# Patient Record
Sex: Female | Born: 1945 | Race: White | Hispanic: No | Marital: Married | Smoking: Never smoker
Health system: Southern US, Community
[De-identification: ages and names within clinical notes are randomized; demographics above are authoritative.]

## PROBLEM LIST (undated history)

## (undated) DIAGNOSIS — E785 Hyperlipidemia, unspecified: Secondary | ICD-10-CM

## (undated) DIAGNOSIS — G43909 Migraine, unspecified, not intractable, without status migrainosus: Secondary | ICD-10-CM

## (undated) DIAGNOSIS — J210 Acute bronchiolitis due to respiratory syncytial virus: Secondary | ICD-10-CM

## (undated) DIAGNOSIS — M1990 Unspecified osteoarthritis, unspecified site: Secondary | ICD-10-CM

## (undated) DIAGNOSIS — J309 Allergic rhinitis, unspecified: Secondary | ICD-10-CM

## (undated) DIAGNOSIS — J40 Bronchitis, not specified as acute or chronic: Secondary | ICD-10-CM

## (undated) HISTORY — DX: Migraine, unspecified, not intractable, without status migrainosus: G43.909

## (undated) HISTORY — PX: COLON RESECTION: SHX5231

## (undated) HISTORY — DX: Acute bronchiolitis due to respiratory syncytial virus: J21.0

## (undated) HISTORY — DX: Unspecified osteoarthritis, unspecified site: M19.90

## (undated) HISTORY — DX: Hyperlipidemia, unspecified: E78.5

## (undated) HISTORY — DX: Allergic rhinitis, unspecified: J30.9

## (undated) HISTORY — PX: OTHER SURGICAL HISTORY: SHX169

## (undated) HISTORY — DX: Bronchitis, not specified as acute or chronic: J40

## (undated) HISTORY — PX: TONSILLECTOMY: SUR1361

---

## 1994-09-16 HISTORY — PX: CHOLECYSTECTOMY: SHX55

## 1999-02-18 ENCOUNTER — Emergency Department (HOSPITAL_COMMUNITY): Admission: EM | Admit: 1999-02-18 | Discharge: 1999-02-18 | Payer: Self-pay | Admitting: Emergency Medicine

## 2004-06-29 ENCOUNTER — Ambulatory Visit: Payer: Self-pay | Admitting: Gastroenterology

## 2004-07-11 ENCOUNTER — Ambulatory Visit: Payer: Self-pay | Admitting: General Surgery

## 2004-07-13 ENCOUNTER — Ambulatory Visit: Payer: Self-pay | Admitting: General Surgery

## 2004-07-25 ENCOUNTER — Inpatient Hospital Stay: Payer: Self-pay | Admitting: General Surgery

## 2004-08-14 ENCOUNTER — Ambulatory Visit: Payer: Self-pay | Admitting: General Surgery

## 2004-08-17 ENCOUNTER — Ambulatory Visit: Payer: Self-pay | Admitting: General Surgery

## 2004-08-28 ENCOUNTER — Ambulatory Visit: Payer: Self-pay | Admitting: General Surgery

## 2004-11-12 ENCOUNTER — Ambulatory Visit: Payer: Self-pay | Admitting: General Surgery

## 2005-12-23 ENCOUNTER — Ambulatory Visit: Payer: Self-pay

## 2006-08-25 ENCOUNTER — Ambulatory Visit: Payer: Self-pay | Admitting: Gastroenterology

## 2006-12-18 ENCOUNTER — Ambulatory Visit: Payer: Self-pay | Admitting: Internal Medicine

## 2007-04-06 ENCOUNTER — Ambulatory Visit: Payer: Self-pay | Admitting: Internal Medicine

## 2009-04-18 ENCOUNTER — Ambulatory Visit: Payer: Self-pay | Admitting: Internal Medicine

## 2009-10-23 ENCOUNTER — Ambulatory Visit: Payer: Self-pay | Admitting: Internal Medicine

## 2009-11-06 ENCOUNTER — Ambulatory Visit: Payer: Self-pay | Admitting: Gastroenterology

## 2010-06-01 ENCOUNTER — Other Ambulatory Visit: Payer: Self-pay | Admitting: Internal Medicine

## 2011-11-14 ENCOUNTER — Ambulatory Visit: Payer: Self-pay

## 2012-02-25 ENCOUNTER — Ambulatory Visit: Payer: Self-pay

## 2013-05-25 ENCOUNTER — Ambulatory Visit: Payer: Self-pay

## 2015-10-27 DIAGNOSIS — D519 Vitamin B12 deficiency anemia, unspecified: Secondary | ICD-10-CM | POA: Diagnosis not present

## 2015-12-27 DIAGNOSIS — M25562 Pain in left knee: Secondary | ICD-10-CM | POA: Diagnosis not present

## 2015-12-27 DIAGNOSIS — M79604 Pain in right leg: Secondary | ICD-10-CM | POA: Diagnosis not present

## 2015-12-27 DIAGNOSIS — G894 Chronic pain syndrome: Secondary | ICD-10-CM | POA: Diagnosis not present

## 2015-12-27 DIAGNOSIS — M25561 Pain in right knee: Secondary | ICD-10-CM | POA: Diagnosis not present

## 2015-12-27 DIAGNOSIS — M069 Rheumatoid arthritis, unspecified: Secondary | ICD-10-CM | POA: Diagnosis not present

## 2015-12-27 DIAGNOSIS — Z79899 Other long term (current) drug therapy: Secondary | ICD-10-CM | POA: Diagnosis not present

## 2015-12-27 DIAGNOSIS — Z79891 Long term (current) use of opiate analgesic: Secondary | ICD-10-CM | POA: Diagnosis not present

## 2015-12-27 DIAGNOSIS — R52 Pain, unspecified: Secondary | ICD-10-CM | POA: Diagnosis not present

## 2016-01-26 ENCOUNTER — Other Ambulatory Visit: Payer: Self-pay | Admitting: Physician Assistant

## 2016-01-26 ENCOUNTER — Ambulatory Visit
Admission: RE | Admit: 2016-01-26 | Discharge: 2016-01-26 | Disposition: A | Discharge status: Home / Self Care | Payer: Medicare Other | Source: Ambulatory Visit | Attending: Physician Assistant | Admitting: Physician Assistant

## 2016-01-26 DIAGNOSIS — R05 Cough: Secondary | ICD-10-CM | POA: Diagnosis not present

## 2016-01-26 DIAGNOSIS — J986 Disorders of diaphragm: Secondary | ICD-10-CM | POA: Diagnosis not present

## 2016-01-26 DIAGNOSIS — D519 Vitamin B12 deficiency anemia, unspecified: Secondary | ICD-10-CM | POA: Diagnosis not present

## 2016-01-26 DIAGNOSIS — R059 Cough, unspecified: Secondary | ICD-10-CM

## 2016-01-26 DIAGNOSIS — J209 Acute bronchitis, unspecified: Secondary | ICD-10-CM | POA: Diagnosis not present

## 2016-01-31 DIAGNOSIS — J986 Disorders of diaphragm: Secondary | ICD-10-CM | POA: Diagnosis not present

## 2016-01-31 DIAGNOSIS — J209 Acute bronchitis, unspecified: Secondary | ICD-10-CM | POA: Diagnosis not present

## 2016-02-22 ENCOUNTER — Ambulatory Visit
Admission: RE | Admit: 2016-02-22 | Discharge: 2016-02-22 | Disposition: A | Discharge status: Home / Self Care | Payer: Medicare Other | Source: Ambulatory Visit | Attending: Physician Assistant | Admitting: Physician Assistant

## 2016-02-22 ENCOUNTER — Other Ambulatory Visit: Payer: Self-pay | Admitting: Physician Assistant

## 2016-02-22 DIAGNOSIS — R059 Cough, unspecified: Secondary | ICD-10-CM

## 2016-02-22 DIAGNOSIS — R05 Cough: Secondary | ICD-10-CM | POA: Diagnosis not present

## 2016-02-22 DIAGNOSIS — R918 Other nonspecific abnormal finding of lung field: Secondary | ICD-10-CM | POA: Diagnosis not present

## 2016-02-23 DIAGNOSIS — D519 Vitamin B12 deficiency anemia, unspecified: Secondary | ICD-10-CM | POA: Diagnosis not present

## 2016-02-23 DIAGNOSIS — I517 Cardiomegaly: Secondary | ICD-10-CM | POA: Diagnosis not present

## 2016-02-23 DIAGNOSIS — J986 Disorders of diaphragm: Secondary | ICD-10-CM | POA: Diagnosis not present

## 2016-03-06 DIAGNOSIS — I517 Cardiomegaly: Secondary | ICD-10-CM | POA: Diagnosis not present

## 2016-03-12 DIAGNOSIS — M79604 Pain in right leg: Secondary | ICD-10-CM | POA: Diagnosis not present

## 2016-03-12 DIAGNOSIS — M25561 Pain in right knee: Secondary | ICD-10-CM | POA: Diagnosis not present

## 2016-03-12 DIAGNOSIS — Z79899 Other long term (current) drug therapy: Secondary | ICD-10-CM | POA: Diagnosis not present

## 2016-03-12 DIAGNOSIS — R52 Pain, unspecified: Secondary | ICD-10-CM | POA: Diagnosis not present

## 2016-03-12 DIAGNOSIS — M25562 Pain in left knee: Secondary | ICD-10-CM | POA: Diagnosis not present

## 2016-03-12 DIAGNOSIS — M069 Rheumatoid arthritis, unspecified: Secondary | ICD-10-CM | POA: Diagnosis not present

## 2016-06-03 DIAGNOSIS — R52 Pain, unspecified: Secondary | ICD-10-CM | POA: Diagnosis not present

## 2016-06-03 DIAGNOSIS — M79604 Pain in right leg: Secondary | ICD-10-CM | POA: Diagnosis not present

## 2016-06-03 DIAGNOSIS — M069 Rheumatoid arthritis, unspecified: Secondary | ICD-10-CM | POA: Diagnosis not present

## 2016-06-03 DIAGNOSIS — M4806 Spinal stenosis, lumbar region: Secondary | ICD-10-CM | POA: Diagnosis not present

## 2016-06-03 DIAGNOSIS — M25561 Pain in right knee: Secondary | ICD-10-CM | POA: Diagnosis not present

## 2016-06-03 DIAGNOSIS — M25562 Pain in left knee: Secondary | ICD-10-CM | POA: Diagnosis not present

## 2016-06-03 DIAGNOSIS — Z79899 Other long term (current) drug therapy: Secondary | ICD-10-CM | POA: Diagnosis not present

## 2016-08-14 DIAGNOSIS — Z23 Encounter for immunization: Secondary | ICD-10-CM | POA: Diagnosis not present

## 2016-09-03 DIAGNOSIS — M48061 Spinal stenosis, lumbar region without neurogenic claudication: Secondary | ICD-10-CM | POA: Diagnosis not present

## 2016-09-03 DIAGNOSIS — M79604 Pain in right leg: Secondary | ICD-10-CM | POA: Diagnosis not present

## 2016-09-03 DIAGNOSIS — M069 Rheumatoid arthritis, unspecified: Secondary | ICD-10-CM | POA: Diagnosis not present

## 2016-09-03 DIAGNOSIS — R52 Pain, unspecified: Secondary | ICD-10-CM | POA: Diagnosis not present

## 2016-09-03 DIAGNOSIS — Z79891 Long term (current) use of opiate analgesic: Secondary | ICD-10-CM | POA: Diagnosis not present

## 2016-09-03 DIAGNOSIS — Z79899 Other long term (current) drug therapy: Secondary | ICD-10-CM | POA: Diagnosis not present

## 2016-09-03 DIAGNOSIS — M25562 Pain in left knee: Secondary | ICD-10-CM | POA: Diagnosis not present

## 2016-09-03 DIAGNOSIS — G894 Chronic pain syndrome: Secondary | ICD-10-CM | POA: Diagnosis not present

## 2016-09-03 DIAGNOSIS — M25561 Pain in right knee: Secondary | ICD-10-CM | POA: Diagnosis not present

## 2016-09-23 DIAGNOSIS — I517 Cardiomegaly: Secondary | ICD-10-CM | POA: Diagnosis not present

## 2016-09-23 DIAGNOSIS — Z0001 Encounter for general adult medical examination with abnormal findings: Secondary | ICD-10-CM | POA: Diagnosis not present

## 2016-09-23 DIAGNOSIS — D519 Vitamin B12 deficiency anemia, unspecified: Secondary | ICD-10-CM | POA: Diagnosis not present

## 2016-09-23 DIAGNOSIS — K219 Gastro-esophageal reflux disease without esophagitis: Secondary | ICD-10-CM | POA: Diagnosis not present

## 2016-11-26 DIAGNOSIS — Z79899 Other long term (current) drug therapy: Secondary | ICD-10-CM | POA: Diagnosis not present

## 2016-11-26 DIAGNOSIS — M25562 Pain in left knee: Secondary | ICD-10-CM | POA: Diagnosis not present

## 2016-11-26 DIAGNOSIS — M25561 Pain in right knee: Secondary | ICD-10-CM | POA: Diagnosis not present

## 2016-11-26 DIAGNOSIS — M069 Rheumatoid arthritis, unspecified: Secondary | ICD-10-CM | POA: Diagnosis not present

## 2017-02-06 DIAGNOSIS — D519 Vitamin B12 deficiency anemia, unspecified: Secondary | ICD-10-CM | POA: Diagnosis not present

## 2017-02-25 DIAGNOSIS — G894 Chronic pain syndrome: Secondary | ICD-10-CM | POA: Diagnosis not present

## 2017-02-25 DIAGNOSIS — M25561 Pain in right knee: Secondary | ICD-10-CM | POA: Diagnosis not present

## 2017-02-25 DIAGNOSIS — Z79899 Other long term (current) drug therapy: Secondary | ICD-10-CM | POA: Diagnosis not present

## 2017-02-25 DIAGNOSIS — M25562 Pain in left knee: Secondary | ICD-10-CM | POA: Diagnosis not present

## 2017-02-25 DIAGNOSIS — Z79891 Long term (current) use of opiate analgesic: Secondary | ICD-10-CM | POA: Diagnosis not present

## 2017-02-25 DIAGNOSIS — M069 Rheumatoid arthritis, unspecified: Secondary | ICD-10-CM | POA: Diagnosis not present

## 2017-03-12 DIAGNOSIS — R05 Cough: Secondary | ICD-10-CM | POA: Diagnosis not present

## 2017-03-12 DIAGNOSIS — D519 Vitamin B12 deficiency anemia, unspecified: Secondary | ICD-10-CM | POA: Diagnosis not present

## 2017-03-12 DIAGNOSIS — J069 Acute upper respiratory infection, unspecified: Secondary | ICD-10-CM | POA: Diagnosis not present

## 2017-03-18 ENCOUNTER — Ambulatory Visit
Admission: RE | Admit: 2017-03-18 | Discharge: 2017-03-18 | Disposition: A | Discharge status: Home / Self Care | Payer: Medicare Other | Source: Ambulatory Visit | Attending: Nurse Practitioner | Admitting: Nurse Practitioner

## 2017-03-18 ENCOUNTER — Other Ambulatory Visit: Payer: Self-pay | Admitting: Nurse Practitioner

## 2017-03-18 DIAGNOSIS — R059 Cough, unspecified: Secondary | ICD-10-CM

## 2017-03-18 DIAGNOSIS — R062 Wheezing: Secondary | ICD-10-CM

## 2017-03-18 DIAGNOSIS — R05 Cough: Secondary | ICD-10-CM | POA: Diagnosis not present

## 2017-06-12 DIAGNOSIS — M48061 Spinal stenosis, lumbar region without neurogenic claudication: Secondary | ICD-10-CM | POA: Diagnosis not present

## 2017-06-12 DIAGNOSIS — M069 Rheumatoid arthritis, unspecified: Secondary | ICD-10-CM | POA: Diagnosis not present

## 2017-06-12 DIAGNOSIS — M25562 Pain in left knee: Secondary | ICD-10-CM | POA: Diagnosis not present

## 2017-06-12 DIAGNOSIS — M25561 Pain in right knee: Secondary | ICD-10-CM | POA: Diagnosis not present

## 2017-08-05 DIAGNOSIS — J069 Acute upper respiratory infection, unspecified: Secondary | ICD-10-CM | POA: Diagnosis not present

## 2017-08-05 DIAGNOSIS — D519 Vitamin B12 deficiency anemia, unspecified: Secondary | ICD-10-CM | POA: Diagnosis not present

## 2017-08-05 DIAGNOSIS — R05 Cough: Secondary | ICD-10-CM | POA: Diagnosis not present

## 2017-08-27 DIAGNOSIS — M48061 Spinal stenosis, lumbar region without neurogenic claudication: Secondary | ICD-10-CM | POA: Diagnosis not present

## 2017-08-27 DIAGNOSIS — M25562 Pain in left knee: Secondary | ICD-10-CM | POA: Diagnosis not present

## 2017-08-27 DIAGNOSIS — M069 Rheumatoid arthritis, unspecified: Secondary | ICD-10-CM | POA: Diagnosis not present

## 2017-08-27 DIAGNOSIS — M25561 Pain in right knee: Secondary | ICD-10-CM | POA: Diagnosis not present

## 2017-09-25 ENCOUNTER — Encounter: Payer: Self-pay | Admitting: Nurse Practitioner

## 2017-09-25 ENCOUNTER — Other Ambulatory Visit: Payer: Self-pay

## 2017-09-25 DIAGNOSIS — R059 Cough, unspecified: Secondary | ICD-10-CM | POA: Insufficient documentation

## 2017-09-25 DIAGNOSIS — J069 Acute upper respiratory infection, unspecified: Secondary | ICD-10-CM | POA: Insufficient documentation

## 2017-09-25 DIAGNOSIS — R05 Cough: Secondary | ICD-10-CM | POA: Insufficient documentation

## 2017-10-16 ENCOUNTER — Encounter: Payer: Self-pay | Admitting: Nurse Practitioner

## 2017-10-16 ENCOUNTER — Ambulatory Visit: Payer: Medicare Other | Admitting: Nurse Practitioner

## 2017-10-16 VITALS — BP 134/86 | HR 102 | Temp 99.3°F | Resp 16 | Ht 64.0 in | Wt 168.0 lb

## 2017-10-16 DIAGNOSIS — N390 Urinary tract infection, site not specified: Secondary | ICD-10-CM | POA: Diagnosis not present

## 2017-10-16 DIAGNOSIS — E538 Deficiency of other specified B group vitamins: Secondary | ICD-10-CM

## 2017-10-16 DIAGNOSIS — R110 Nausea: Secondary | ICD-10-CM

## 2017-10-16 DIAGNOSIS — D509 Iron deficiency anemia, unspecified: Secondary | ICD-10-CM

## 2017-10-16 DIAGNOSIS — R300 Dysuria: Secondary | ICD-10-CM | POA: Diagnosis not present

## 2017-10-16 DIAGNOSIS — E559 Vitamin D deficiency, unspecified: Secondary | ICD-10-CM

## 2017-10-16 LAB — POCT URINALYSIS DIPSTICK
GLUCOSE UA: NEGATIVE
Nitrite, UA: NEGATIVE
Spec Grav, UA: >=1.03 — AB (ref 1.010–1.025)
Urobilinogen, UA: 2 E.U./dL — AB
pH, UA: 6 (ref 5.0–8.0)

## 2017-10-16 MED ORDER — ONDANSETRON HCL 4 MG PO TABS: 4.0000 mg | ORAL_TABLET | Freq: Three times a day (TID) | ORAL | 1 refills | Status: DC | PRN

## 2017-10-16 MED ORDER — AMOXICILLIN 875 MG PO TABS: 875.0000 mg | ORAL_TABLET | Freq: Two times a day (BID) | ORAL | 0 refills | Status: DC

## 2017-10-16 NOTE — Progress Notes (Addendum)
[facility] ([facility]) [address] [place], [place] [address]  Internal MEDICINE  Office Visit Note  Patient Name: Morgan Abbott  [id number]  [id number]  Date of Service: 10/26/2017  Chief Complaint  Patient presents with  . Nausea    no vomiting. intermittent. indigestion.    The patient is here for sick visit. Feels nauseated but unable to vomit. Started Monday. Unable to eat due to nausea. Feels like she has a hard piece of candy or something stuck in esophagus, going up and down. Hurts a little under her rib cage. She also has increased indigestion. She has taken zantac, omeprazole, and dramamine, without relief of symptoms.    Other  This is a new problem. The current episode started in the past 7 days. The problem occurs daily. The problem has been unchanged. Associated symptoms include abdominal pain, arthralgias, chills, congestion, fatigue, headaches, nausea and urinary symptoms. Pertinent negatives include no chest pain, coughing, sore throat or vomiting. The symptoms are aggravated by swallowing. Treatments tried: OTC dramamine, zantac, and omeprazole. Improvement on treatment: very little improvement.    Pt is here for routine follow up.    Current Medication: Outpatient Encounter Medications as of 10/16/2017  Medication Sig  . betamethasone dipropionate (DIPROLENE) 0.05 % cream Apply topically 2 (two) times daily.  Marland Kitchen ibuprofen (ADVIL,MOTRIN) 800 MG tablet Take 800 mg by mouth 3 (three) times daily as needed.  Marland Kitchen omeprazole (PRILOSEC) 40 MG capsule Take 40 mg by mouth daily.  Marland Kitchen oxyCODONE-acetaminophen (PERCOCET) 10-325 MG tablet TAKE 1 TABLET BY MOUTH EVERY 4 TO 6 HOURS AS NEEDED *MAX OF 5 A DAY* *DNF UNTIL 10/10/17*  . triamcinolone (KENALOG) 0.025 % cream Apply 1 application topically 2 (two) times daily as needed.  Marland Kitchen amoxicillin (AMOXIL) 875 MG tablet Take 1 tablet (875 mg total) by mouth 2 (two) times daily.  . ondansetron (ZOFRAN) 4 MG tablet Take 1 tablet (4 mg  total) by mouth every 8 (eight) hours as needed for nausea or vomiting.   No facility-administered encounter medications on file as of 10/16/2017.     Surgical History: Past Surgical History:  Procedure Laterality Date  . CHOLECYSTECTOMY  1996  . COLON RESECTION    . dilatation and curettage    . TONSILLECTOMY Bilateral     Medical History: Past Medical History:  Diagnosis Date  . Allergic rhinitis   . Arthritis   . Bronchitis   . Hyperlipidemia   . Migraines     Family History: No family history on file.  Social History   Socioeconomic History  . Marital status: Married    Spouse name: Not on file  . Number of children: Not on file  . Years of education: Not on file  . Highest education level: Not on file  Social Needs  . Financial resource strain: Not on file  . Food insecurity - worry: Not on file  . Food insecurity - inability: Not on file  . Transportation needs - medical: Not on file  . Transportation needs - non-medical: Not on file  Occupational History  . Not on file  Tobacco Use  . Smoking status: Never Smoker  . Smokeless tobacco: Never Used  Substance and Sexual Activity  . Alcohol use: No    Frequency: Never  . Drug use: No  . Sexual activity: Not on file  Other Topics Concern  . Not on file  Social History Narrative  . Not on file      Review of Systems  Constitutional: Positive for activity change, appetite change, chills and fatigue.  HENT: Positive for congestion. Negative for postnasal drip, sinus pressure and sore throat.   Eyes: Negative.   Respiratory: Negative for cough, chest tightness, shortness of breath and wheezing.   Cardiovascular: Negative for chest pain and palpitations.  Gastrointestinal: Positive for abdominal pain and nausea. Negative for vomiting.       Increased indigestion.  Endocrine: Negative for cold intolerance, heat intolerance, polydipsia, polyphagia and polyuria.  Genitourinary: Positive for frequency.        Dark color to urine  Musculoskeletal: Positive for arthralgias.  Allergic/Immunologic: Negative.   Neurological: Positive for headaches.  Hematological: Negative for adenopathy. Does not bruise/bleed easily.  Psychiatric/Behavioral: The patient is nervous/anxious.     Today's Vitals   10/16/17 1454  BP: 134/86  Pulse: (!) 102  Resp: 16  Temp: 99.3 F (37.4 C)  SpO2: 96%  Weight: 168 lb (76.2 kg)  Height: 5\' 4"  (1.626 m)    Physical Exam  Constitutional: She is oriented to person, place, and time. She appears well-developed and well-nourished. No distress.  HENT:  Head: Normocephalic and atraumatic.  Mouth/Throat: Oropharynx is clear and moist. No oropharyngeal exudate.  Eyes: EOM are normal. Pupils are equal, round, and reactive to light.  Neck: Normal range of motion. Neck supple. No JVD present. No tracheal deviation present. No thyromegaly present.  Cardiovascular: Normal rate, regular rhythm and normal heart sounds. Exam reveals no gallop and no friction rub.  No murmur heard. Pulmonary/Chest: Effort normal and breath sounds normal. No respiratory distress. She has no wheezes. She has no rales. She exhibits no tenderness.  Abdominal: Soft. Bowel sounds are normal. There is tenderness.  Genitourinary:  Genitourinary Comments: Urine sample positive for moderate blood, trace WBC, and moderate protein.   Musculoskeletal: Normal range of motion.  Lymphadenopathy:    She has no cervical adenopathy.  Neurological: She is alert and oriented to person, place, and time. No cranial nerve deficit.  Skin: Skin is warm and dry. She is not diaphoretic.  Psychiatric: She has a normal mood and affect. Her behavior is normal. Judgment and thought content normal.  Nursing note and vitals reviewed.  Assessment/Plan: 1. Urinary tract infection without hematuria, site unspecified - amoxicillin (AMOXIL) 875 MG tablet; Take 1 tablet (875 mg total) by mouth 2 (two) times daily.  Dispense: 20  tablet; Refill: 0 - CBC w/Diff/Platelet Send urine for culture and sensitivity and adjjust antibiotics a indicated.  2. Dysuria - POCT Urinalysis Dipstick - CULTURE, URINE COMPREHENSIVE Treat infection with amoxicillin 875mg  bid for 10 days. Adjust abx accordig to culture reults.   3. Vitamin B12 deficiency - B12  4. Vitamin D deficiency - Vitamin D 1,25 dihydroxy  5. Nausea - ondansetron (ZOFRAN) 4 MG tablet; Take 1 tablet (4 mg total) by mouth every 8 (eight) hours as needed for nausea or vomiting.  Dispense: 30 tablet; Refill: 1 - CBC w/Diff/Platelet - Comprehensive Metabolic Panel (CMET) BRAT diet recommended. advnce diet as indicated.   6. Iron deficiency anemia, unspecified iron deficiency anemia type - Ferritin   General Counseling: Rona verbalizes understanding of the findings of todays visit and agrees with plan of treatment. I have discussed any further diagnostic evaluation that may be needed or ordered today. We also reviewed her medications today. she has been encouraged to call the office with any questions or concerns that should arise related to todays visit.  This patient was seen by Vincent Gros, FNP- C in Collaboration  with Dr Lyndon CodeFozia M Khan as a part of collaborative care agreement     Orders Placed This Encounter  Procedures  . CULTURE, URINE COMPREHENSIVE  . CBC w/Diff/Platelet  . Comprehensive Metabolic Panel (CMET)  . Vitamin D 1,25 dihydroxy  . B12  . Ferritin  . POCT Urinalysis Dipstick    Meds ordered this encounter  Medications  . amoxicillin (AMOXIL) 875 MG tablet    Sig: Take 1 tablet (875 mg total) by mouth 2 (two) times daily.    Dispense:  20 tablet    Refill:  0    Order Specific Question:   Supervising Provider    Answer:   Lyndon CodeKHAN, FOZIA M [[id number]]  . ondansetron (ZOFRAN) 4 MG tablet    Sig: Take 1 tablet (4 mg total) by mouth every 8 (eight) hours as needed for nausea or vomiting.    Dispense:  30 tablet    Refill:  1    Order  Specific Question:   Supervising Provider    Answer:   Lyndon CodeKHAN, FOZIA M [[id number]]    Time spent: 6915 Minutes     Dr Lyndon CodeFozia M Khan Internal medicine

## 2017-10-19 LAB — CULTURE, URINE COMPREHENSIVE

## 2017-10-20 LAB — CBC WITH DIFFERENTIAL/PLATELET
BASOS ABS: 0.1 10*3/uL (ref 0.0–0.2)
Basos: 1 %
EOS (ABSOLUTE): 0 10*3/uL (ref 0.0–0.4)
Eos: 0 %
HEMOGLOBIN: 14.2 g/dL (ref 11.1–15.9)
Hematocrit: 42.1 % (ref 34.0–46.6)
IMMATURE GRANS (ABS): 0 10*3/uL (ref 0.0–0.1)
Immature Granulocytes: 0 %
LYMPHS ABS: 1.5 10*3/uL (ref 0.7–3.1)
LYMPHS: 16 %
MCH: 30.1 pg (ref 26.6–33.0)
MCHC: 33.7 g/dL (ref 31.5–35.7)
MCV: 89 fL (ref 79–97)
MONOCYTES: 7 %
Monocytes Absolute: 0.6 10*3/uL (ref 0.1–0.9)
Neutrophils Absolute: 7.3 10*3/uL — ABNORMAL HIGH (ref 1.4–7.0)
Neutrophils: 76 %
PLATELETS: 273 10*3/uL (ref 150–379)
RBC: 4.72 x10E6/uL (ref 3.77–5.28)
RDW: 14.1 % (ref 12.3–15.4)
WBC: 9.5 10*3/uL (ref 3.4–10.8)

## 2017-10-20 LAB — COMPREHENSIVE METABOLIC PANEL
ALBUMIN: 4.7 g/dL (ref 3.5–4.8)
ALK PHOS: 135 IU/L — AB (ref 39–117)
ALT: 60 IU/L — AB (ref 0–32)
AST: 53 IU/L — AB (ref 0–40)
Albumin/Globulin Ratio: 1.6 (ref 1.2–2.2)
BILIRUBIN TOTAL: 2.9 mg/dL — AB (ref 0.0–1.2)
BUN / CREAT RATIO: 12 (ref 12–28)
BUN: 14 mg/dL (ref 8–27)
CHLORIDE: 103 mmol/L (ref 96–106)
CO2: 22 mmol/L (ref 20–29)
CREATININE: 1.2 mg/dL — AB (ref 0.57–1.00)
Calcium: 10.2 mg/dL (ref 8.7–10.3)
GFR calc Af Amer: 53 mL/min/{1.73_m2} — ABNORMAL LOW (ref >59)
GFR calc non Af Amer: 46 mL/min/{1.73_m2} — ABNORMAL LOW (ref >59)
GLUCOSE: 126 mg/dL — AB (ref 65–99)
Globulin, Total: 3 g/dL (ref 1.5–4.5)
Potassium: 3.9 mmol/L (ref 3.5–5.2)
Sodium: 142 mmol/L (ref 134–144)
Total Protein: 7.7 g/dL (ref 6.0–8.5)

## 2017-10-20 LAB — VITAMIN D 1,25 DIHYDROXY
VITAMIN D 1, 25 (OH) TOTAL: 37 pg/mL
VITAMIN D2 1, 25 (OH): 16 pg/mL
VITAMIN D3 1, 25 (OH): 21 pg/mL

## 2017-10-20 LAB — VITAMIN B12: VITAMIN B 12: 387 pg/mL (ref 232–1245)

## 2017-10-20 LAB — FERRITIN: FERRITIN: 158 ng/mL — AB (ref 15–150)

## 2017-10-26 ENCOUNTER — Encounter: Payer: Self-pay | Admitting: Nurse Practitioner

## 2017-10-26 DIAGNOSIS — E559 Vitamin D deficiency, unspecified: Secondary | ICD-10-CM | POA: Insufficient documentation

## 2017-10-26 DIAGNOSIS — N390 Urinary tract infection, site not specified: Secondary | ICD-10-CM | POA: Insufficient documentation

## 2017-10-26 DIAGNOSIS — D509 Iron deficiency anemia, unspecified: Secondary | ICD-10-CM | POA: Insufficient documentation

## 2017-10-26 DIAGNOSIS — E538 Deficiency of other specified B group vitamins: Secondary | ICD-10-CM | POA: Insufficient documentation

## 2017-10-26 DIAGNOSIS — R110 Nausea: Secondary | ICD-10-CM | POA: Insufficient documentation

## 2017-11-03 ENCOUNTER — Ambulatory Visit: Payer: Medicare Other | Admitting: Nurse Practitioner

## 2017-11-03 VITALS — BP 146/86 | HR 88 | Ht 63.0 in | Wt 163.8 lb

## 2017-11-03 DIAGNOSIS — R945 Abnormal results of liver function studies: Secondary | ICD-10-CM

## 2017-11-03 DIAGNOSIS — F411 Generalized anxiety disorder: Secondary | ICD-10-CM | POA: Diagnosis not present

## 2017-11-03 DIAGNOSIS — N390 Urinary tract infection, site not specified: Secondary | ICD-10-CM | POA: Diagnosis not present

## 2017-11-03 DIAGNOSIS — R300 Dysuria: Secondary | ICD-10-CM

## 2017-11-03 DIAGNOSIS — E538 Deficiency of other specified B group vitamins: Secondary | ICD-10-CM

## 2017-11-03 DIAGNOSIS — K219 Gastro-esophageal reflux disease without esophagitis: Secondary | ICD-10-CM

## 2017-11-03 DIAGNOSIS — R7989 Other specified abnormal findings of blood chemistry: Secondary | ICD-10-CM

## 2017-11-03 LAB — POCT URINALYSIS DIPSTICK
BILIRUBIN UA: NEGATIVE
GLUCOSE UA: NEGATIVE
Ketones, UA: NEGATIVE
Nitrite, UA: NEGATIVE
RBC UA: NEGATIVE
Spec Grav, UA: 1.01 (ref 1.010–1.025)
Urobilinogen, UA: 0.2 E.U./dL
pH, UA: 5 (ref 5.0–8.0)

## 2017-11-03 MED ORDER — OMEPRAZOLE 40 MG PO CPDR: 40.0000 mg | DELAYED_RELEASE_CAPSULE | Freq: Every day | ORAL | 5 refills | Status: DC

## 2017-11-03 MED ORDER — AMOXICILLIN 875 MG PO TABS: 875.0000 mg | ORAL_TABLET | Freq: Two times a day (BID) | ORAL | 0 refills | Status: DC

## 2017-11-03 MED ORDER — CYANOCOBALAMIN 1000 MCG/ML IJ SOLN
1000.0000 ug | Freq: Once | INTRAMUSCULAR | Status: AC
  Administered 2017-11-03: 1000 ug via INTRAMUSCULAR

## 2017-11-03 MED ORDER — DIAZEPAM 5 MG PO TABS
ORAL_TABLET | ORAL | 3 refills | Status: DC
Start: 2017-11-03 — End: 2019-12-16

## 2017-11-03 NOTE — Progress Notes (Signed)
[facility] [address] [place], [place] [address]  Internal MEDICINE  Office Visit Note  Patient Name: Morgan Abbott  [id number]  [id number]  Date of Service: 11/04/2017  Chief Complaint  Patient presents with  . Urinary Tract Infection    Patient was seen and treated for urinary tract infection at her last visit. She was put on amoxicillin BID for 10 days. Culture and sensitivity of urine indicated mixed urogenital flora. Today, she reports feeling much better. She did have labs drawn since her most recent visit and is here to review the results.     Pt is here for routine follow up.    Current Medication: Outpatient Encounter Medications as of 11/03/2017  Medication Sig  . amoxicillin (AMOXIL) 875 MG tablet Take 1 tablet (875 mg total) by mouth 2 (two) times daily.  . betamethasone dipropionate (DIPROLENE) 0.05 % cream Apply topically 2 (two) times daily.  . diazepam (VALIUM) 5 MG tablet Take 1/2 to 1 tablet po QHS prn anxiety  . ibuprofen (ADVIL,MOTRIN) 800 MG tablet Take 800 mg by mouth 3 (three) times daily as needed.  Marland Kitchen. omeprazole (PRILOSEC) 40 MG capsule Take 1 capsule (40 mg total) by mouth daily.  . ondansetron (ZOFRAN) 4 MG tablet Take 1 tablet (4 mg total) by mouth every 8 (eight) hours as needed for nausea or vomiting.  Marland Kitchen. oxyCODONE-acetaminophen (PERCOCET) 10-325 MG tablet TAKE 1 TABLET BY MOUTH EVERY 4 TO 6 HOURS AS NEEDED *MAX OF 5 A DAY* *DNF UNTIL 10/10/17*  . triamcinolone (KENALOG) 0.025 % cream Apply 1 application topically 2 (two) times daily as needed.  . [DISCONTINUED] amoxicillin (AMOXIL) 875 MG tablet Take 1 tablet (875 mg total) by mouth 2 (two) times daily.  . [DISCONTINUED] omeprazole (PRILOSEC) 40 MG capsule Take 40 mg by mouth daily.  . [EXPIRED] cyanocobalamin ((VITAMIN B-12)) injection 1,000 mcg    No facility-administered encounter medications on file as of 11/03/2017.     Surgical History: Past Surgical History:  Procedure  Laterality Date  . CHOLECYSTECTOMY  1996  . COLON RESECTION    . dilatation and curettage    . TONSILLECTOMY Bilateral     Medical History: Past Medical History:  Diagnosis Date  . Allergic rhinitis   . Arthritis   . Bronchitis   . Hyperlipidemia   . Migraines     Family History: No family history on file.  Social History   Socioeconomic History  . Marital status: Married    Spouse name: Not on file  . Number of children: Not on file  . Years of education: Not on file  . Highest education level: Not on file  Social Needs  . Financial resource strain: Not on file  . Food insecurity - worry: Not on file  . Food insecurity - inability: Not on file  . Transportation needs - medical: Not on file  . Transportation needs - non-medical: Not on file  Occupational History  . Not on file  Tobacco Use  . Smoking status: Never Smoker  . Smokeless tobacco: Never Used  Substance and Sexual Activity  . Alcohol use: No    Frequency: Never  . Drug use: No  . Sexual activity: Not on file  Other Topics Concern  . Not on file  Social History Narrative  . Not on file      Review of Systems  Constitutional: Positive for fatigue. Negative for activity change, appetite change and chills.  HENT: Negative for congestion, postnasal drip, sinus  pressure and sore throat.   Eyes: Negative.   Respiratory: Negative for cough, chest tightness, shortness of breath and wheezing.   Cardiovascular: Negative for chest pain and palpitations.  Gastrointestinal: Negative for abdominal pain, nausea and vomiting.       Increased indigestion.  Endocrine: Negative for cold intolerance, heat intolerance, polydipsia, polyphagia and polyuria.  Genitourinary: Negative for frequency.       Uti symptoms have resolved.   Musculoskeletal: Positive for arthralgias.  Allergic/Immunologic: Negative.   Neurological: Positive for headaches.  Hematological: Negative for adenopathy. Does not bruise/bleed  easily.  Psychiatric/Behavioral: The patient is nervous/anxious.   All other systems reviewed and are negative.   Today's Vitals   11/03/17 1038  BP: (!) 146/86  Pulse: 88  SpO2: 95%  Weight: 163 lb 12.8 oz (74.3 kg)  Height: 5\' 3"  (1.6 m)    Physical Exam  Constitutional: She is oriented to person, place, and time. She appears well-developed and well-nourished. No distress.  HENT:  Head: Normocephalic and atraumatic.  Mouth/Throat: Oropharynx is clear and moist. No oropharyngeal exudate.  Eyes: EOM are normal. Pupils are equal, round, and reactive to light.  Neck: Normal range of motion. Neck supple. No JVD present. No tracheal deviation present. No thyromegaly present.  Cardiovascular: Normal rate, regular rhythm and normal heart sounds. Exam reveals no gallop and no friction rub.  No murmur heard. Pulmonary/Chest: Effort normal and breath sounds normal. No respiratory distress. She has no wheezes. She has no rales. She exhibits no tenderness.  Abdominal: Soft. Bowel sounds are normal. There is no tenderness.  Genitourinary:  Genitourinary Comments: Urine sample positive for trace WBC  Musculoskeletal: Normal range of motion.  Lymphadenopathy:    She has no cervical adenopathy.  Neurological: She is alert and oriented to person, place, and time. No cranial nerve deficit.  Skin: Skin is warm and dry. She is not diaphoretic.  Psychiatric: She has a normal mood and affect. Her behavior is normal. Judgment and thought content normal.  Nursing note and vitals reviewed.   Assessment/Plan:  1. Urinary tract infection without hematuria, site unspecified U/a showing trace WBC. Will continue amoxicillin 875mg  bid for additional 7 days. Continue to drink plenty of water.  - amoxicillin (AMOXIL) 875 MG tablet; Take 1 tablet (875 mg total) by mouth 2 (two) times daily.  Dispense: 14 tablet; Refill: 0  2. B12 deficiency B12 low/normal on recent labs. Continue monthly b12 injections. -  cyanocobalamin ((VITAMIN B-12)) injection 1,000 mcg  3. Dysuria - POCT Urinalysis Dipstick  4. Gastroesophageal reflux disease without esophagitis - omeprazole (PRILOSEC) 40 MG capsule; Take 1 capsule (40 mg total) by mouth daily.  Dispense: 30 capsule; Refill: 5  5. GAD (generalized anxiety disorder) - diazepam (VALIUM) 5 MG tablet; Take 1/2 to 1 tablet po QHS prn anxiety  Dispense: 30 tablet; Refill: 3  6. Abnormal liver function test Recent labs showing elevation of liver functoins. Will have her retest CMP and hepatitis panel in 3 weeks. If abnormality continues, will get abdominal ultrasound for further evaluation.   General Counseling: io dieujuste understanding of the findings of todays visit and agrees with plan of treatment. I have discussed any further diagnostic evaluation that may be needed or ordered today. We also reviewed her medications today. she has been encouraged to call the office with any questions or concerns that should arise related to todays visit.    Orders Placed This Encounter  Procedures  . POCT Urinalysis Dipstick    Meds ordered  this encounter  Medications  . omeprazole (PRILOSEC) 40 MG capsule    Sig: Take 1 capsule (40 mg total) by mouth daily.    Dispense:  30 capsule    Refill:  5    Order Specific Question:   Supervising Provider    Answer:   Lyndon Code [[id number]]  . amoxicillin (AMOXIL) 875 MG tablet    Sig: Take 1 tablet (875 mg total) by mouth 2 (two) times daily.    Dispense:  14 tablet    Refill:  0    Extend rx additional 7 days    Order Specific Question:   Supervising Provider    Answer:   Lyndon Code [[id number]]  . diazepam (VALIUM) 5 MG tablet    Sig: Take 1/2 to 1 tablet po QHS prn anxiety    Dispense:  30 tablet    Refill:  3    Order Specific Question:   Supervising Provider    Answer:   Lyndon Code [[id number]]  . cyanocobalamin ((VITAMIN B-12)) injection 1,000 mcg   This patient was seen by Vincent Gros, FNP- C in  Collaboration with Dr Lyndon Code as a part of collaborative care agreement  Time spent: 20 Minutes   Dr Lyndon Code Internal medicine

## 2017-11-04 ENCOUNTER — Encounter: Payer: Self-pay | Admitting: Nurse Practitioner

## 2017-11-24 DIAGNOSIS — M48061 Spinal stenosis, lumbar region without neurogenic claudication: Secondary | ICD-10-CM | POA: Diagnosis not present

## 2017-11-24 DIAGNOSIS — Z79899 Other long term (current) drug therapy: Secondary | ICD-10-CM | POA: Diagnosis not present

## 2017-11-24 DIAGNOSIS — M25561 Pain in right knee: Secondary | ICD-10-CM | POA: Diagnosis not present

## 2017-11-24 DIAGNOSIS — M069 Rheumatoid arthritis, unspecified: Secondary | ICD-10-CM | POA: Diagnosis not present

## 2017-12-03 ENCOUNTER — Ambulatory Visit (INDEPENDENT_AMBULATORY_CARE_PROVIDER_SITE_OTHER): Payer: Medicare Other

## 2017-12-03 DIAGNOSIS — E538 Deficiency of other specified B group vitamins: Secondary | ICD-10-CM

## 2017-12-03 MED ORDER — CYANOCOBALAMIN 1000 MCG/ML IJ SOLN
1000.0000 ug | Freq: Once | INTRAMUSCULAR | Status: AC
  Administered 2017-12-03: 1000 ug via INTRAMUSCULAR

## 2017-12-31 ENCOUNTER — Other Ambulatory Visit: Payer: Self-pay | Admitting: Internal Medicine

## 2017-12-31 ENCOUNTER — Telehealth: Payer: Self-pay | Admitting: Internal Medicine

## 2017-12-31 ENCOUNTER — Ambulatory Visit (INDEPENDENT_AMBULATORY_CARE_PROVIDER_SITE_OTHER): Payer: Medicare Other

## 2017-12-31 DIAGNOSIS — D519 Vitamin B12 deficiency anemia, unspecified: Secondary | ICD-10-CM

## 2017-12-31 MED ORDER — AZITHROMYCIN 250 MG PO TABS: ORAL_TABLET | ORAL | 0 refills | Status: DC

## 2017-12-31 MED ORDER — CYANOCOBALAMIN 1000 MCG/ML IJ SOLN
1000.0000 ug | Freq: Once | INTRAMUSCULAR | Status: AC
  Administered 2017-12-31: 1000 ug via INTRAMUSCULAR

## 2017-12-31 NOTE — Telephone Encounter (Signed)
Pt informed that zpak sent to pharmacy.  Pt advised that we couldn't send in the tussinex and she asked again if [facility] would reconsider.  I spoke to [facility] and she advised that she wasn't filling the rx for cough medication that pt could take musinex or delsym OTC.  Pt inform of the OTC medications to use.  dbs

## 2018-02-04 ENCOUNTER — Ambulatory Visit: Payer: Self-pay

## 2018-02-05 ENCOUNTER — Other Ambulatory Visit: Payer: Self-pay | Admitting: Nurse Practitioner

## 2018-02-05 ENCOUNTER — Ambulatory Visit (INDEPENDENT_AMBULATORY_CARE_PROVIDER_SITE_OTHER): Payer: Medicare Other

## 2018-02-05 DIAGNOSIS — E538 Deficiency of other specified B group vitamins: Secondary | ICD-10-CM | POA: Diagnosis not present

## 2018-02-05 DIAGNOSIS — R945 Abnormal results of liver function studies: Secondary | ICD-10-CM | POA: Diagnosis not present

## 2018-02-05 MED ORDER — CYANOCOBALAMIN 1000 MCG/ML IJ SOLN
1000.0000 ug | Freq: Once | INTRAMUSCULAR | Status: AC
  Administered 2018-02-05: 1000 ug via INTRAMUSCULAR

## 2018-02-05 NOTE — Progress Notes (Signed)
b12

## 2018-02-06 LAB — COMPREHENSIVE METABOLIC PANEL
ALBUMIN: 4.4 g/dL (ref 3.5–4.8)
ALK PHOS: 101 IU/L (ref 39–117)
ALT: 14 IU/L (ref 0–32)
AST: 20 IU/L (ref 0–40)
Albumin/Globulin Ratio: 1.9 (ref 1.2–2.2)
BILIRUBIN TOTAL: 1.9 mg/dL — AB (ref 0.0–1.2)
BUN / CREAT RATIO: 11 — AB (ref 12–28)
BUN: 10 mg/dL (ref 8–27)
CHLORIDE: 108 mmol/L — AB (ref 96–106)
CO2: 24 mmol/L (ref 20–29)
Calcium: 9.2 mg/dL (ref 8.7–10.3)
Creatinine, Ser: 0.94 mg/dL (ref 0.57–1.00)
GFR calc non Af Amer: 61 mL/min/{1.73_m2} (ref >59)
GFR, EST AFRICAN AMERICAN: 71 mL/min/{1.73_m2} (ref >59)
GLOBULIN, TOTAL: 2.3 g/dL (ref 1.5–4.5)
Glucose: 100 mg/dL — ABNORMAL HIGH (ref 65–99)
Potassium: 4.3 mmol/L (ref 3.5–5.2)
SODIUM: 150 mmol/L — AB (ref 134–144)
Total Protein: 6.7 g/dL (ref 6.0–8.5)

## 2018-02-06 LAB — HEPATITIS PANEL, ACUTE
HEP A IGM: NEGATIVE
Hep B C IgM: NEGATIVE
Hep C Virus Ab: <0.1 s/co ratio (ref 0.0–0.9)
Hepatitis B Surface Ag: NEGATIVE

## 2018-02-17 ENCOUNTER — Telehealth: Payer: Self-pay | Admitting: Nurse Practitioner

## 2018-02-17 NOTE — Telephone Encounter (Signed)
Pt was called and notified of results

## 2018-02-27 ENCOUNTER — Other Ambulatory Visit: Payer: Self-pay

## 2018-02-27 DIAGNOSIS — K219 Gastro-esophageal reflux disease without esophagitis: Secondary | ICD-10-CM

## 2018-02-27 MED ORDER — OMEPRAZOLE 40 MG PO CPDR: 40.0000 mg | DELAYED_RELEASE_CAPSULE | Freq: Every day | ORAL | 5 refills | Status: DC

## 2018-03-04 DIAGNOSIS — M48061 Spinal stenosis, lumbar region without neurogenic claudication: Secondary | ICD-10-CM | POA: Diagnosis not present

## 2018-03-04 DIAGNOSIS — M25819 Other specified joint disorders, unspecified shoulder: Secondary | ICD-10-CM | POA: Diagnosis not present

## 2018-03-04 DIAGNOSIS — M069 Rheumatoid arthritis, unspecified: Secondary | ICD-10-CM | POA: Diagnosis not present

## 2018-03-04 DIAGNOSIS — M25561 Pain in right knee: Secondary | ICD-10-CM | POA: Diagnosis not present

## 2018-03-05 ENCOUNTER — Encounter: Payer: Self-pay | Admitting: Nurse Practitioner

## 2018-05-27 DIAGNOSIS — M48061 Spinal stenosis, lumbar region without neurogenic claudication: Secondary | ICD-10-CM | POA: Diagnosis not present

## 2018-05-27 DIAGNOSIS — M069 Rheumatoid arthritis, unspecified: Secondary | ICD-10-CM | POA: Diagnosis not present

## 2018-05-27 DIAGNOSIS — M25819 Other specified joint disorders, unspecified shoulder: Secondary | ICD-10-CM | POA: Diagnosis not present

## 2018-05-27 DIAGNOSIS — M1991 Primary osteoarthritis, unspecified site: Secondary | ICD-10-CM | POA: Diagnosis not present

## 2018-05-27 DIAGNOSIS — Z79891 Long term (current) use of opiate analgesic: Secondary | ICD-10-CM | POA: Diagnosis not present

## 2018-08-26 DIAGNOSIS — M48061 Spinal stenosis, lumbar region without neurogenic claudication: Secondary | ICD-10-CM | POA: Diagnosis not present

## 2018-08-26 DIAGNOSIS — M25561 Pain in right knee: Secondary | ICD-10-CM | POA: Diagnosis not present

## 2018-08-26 DIAGNOSIS — M069 Rheumatoid arthritis, unspecified: Secondary | ICD-10-CM | POA: Diagnosis not present

## 2018-08-26 DIAGNOSIS — M25562 Pain in left knee: Secondary | ICD-10-CM | POA: Diagnosis not present

## 2018-09-02 ENCOUNTER — Other Ambulatory Visit: Payer: Self-pay

## 2018-09-02 DIAGNOSIS — K219 Gastro-esophageal reflux disease without esophagitis: Secondary | ICD-10-CM

## 2018-09-02 MED ORDER — OMEPRAZOLE 40 MG PO CPDR: 40.0000 mg | DELAYED_RELEASE_CAPSULE | Freq: Every day | ORAL | 5 refills | Status: DC

## 2018-11-25 DIAGNOSIS — M069 Rheumatoid arthritis, unspecified: Secondary | ICD-10-CM | POA: Diagnosis not present

## 2018-11-25 DIAGNOSIS — M25562 Pain in left knee: Secondary | ICD-10-CM | POA: Diagnosis not present

## 2018-11-25 DIAGNOSIS — M25561 Pain in right knee: Secondary | ICD-10-CM | POA: Diagnosis not present

## 2018-11-25 DIAGNOSIS — M48061 Spinal stenosis, lumbar region without neurogenic claudication: Secondary | ICD-10-CM | POA: Diagnosis not present

## 2018-12-01 ENCOUNTER — Ambulatory Visit: Payer: Medicare Other | Admitting: Nurse Practitioner

## 2019-01-28 ENCOUNTER — Other Ambulatory Visit: Payer: Self-pay

## 2019-01-28 MED ORDER — BETAMETHASONE DIPROPIONATE 0.05 % EX CREA: TOPICAL_CREAM | Freq: Two times a day (BID) | CUTANEOUS | 0 refills | Status: DC

## 2019-02-11 ENCOUNTER — Ambulatory Visit: Payer: Medicare Other | Admitting: Nurse Practitioner

## 2019-02-11 ENCOUNTER — Encounter: Payer: Self-pay | Admitting: Nurse Practitioner

## 2019-02-11 ENCOUNTER — Other Ambulatory Visit: Payer: Self-pay

## 2019-02-11 VITALS — Ht 63.0 in | Wt 165.0 lb

## 2019-02-11 DIAGNOSIS — E538 Deficiency of other specified B group vitamins: Secondary | ICD-10-CM

## 2019-02-11 DIAGNOSIS — K219 Gastro-esophageal reflux disease without esophagitis: Secondary | ICD-10-CM | POA: Diagnosis not present

## 2019-02-11 DIAGNOSIS — F411 Generalized anxiety disorder: Secondary | ICD-10-CM

## 2019-02-11 DIAGNOSIS — J4520 Mild intermittent asthma, uncomplicated: Secondary | ICD-10-CM

## 2019-02-11 NOTE — Progress Notes (Signed)
[facility] [address] [place], [place] [address]  Internal MEDICINE  Telephone Visit  Patient Name: Morgan Abbott  [id number]  [id number]  Date of Service: 02/22/2019  I connected with the patient at 11:50am by telephone and verified the patients identity using two identifiers.   I discussed the limitations, risks, security and privacy concerns of performing an evaluation and management service by telephone and the availability of in person appointments. I also discussed with the patient that there may be a patient responsible charge related to the service.  The patient expressed understanding and agrees to proceed.    Chief Complaint  Patient presents with  . Telephone Assessment  . Telephone Screen  . Follow-up    note for work  . Quality Metric Gaps    BMD,AWV and pneumonia    The patient has been contacted via telephone for follow up visit due to concerns for spread of novel coronavirus. The patient has been out of work since March due to novel coronavirus. She has been getting her normal pay and this will continue through June 1. Her HR representative told her that, in order to continue this benefit, she needs to have a letter from her PCP stating that she is, in fact, at increased risk for contracting COVID 19 as well as complications from this disease. The patient is older than 72 years of age, which puts her, automatically at increased risk for contracting the virus and from suffering complications related to it. She has also had frequent episodes of pneumonia, most recent in 2018. Those, like Morgan Abbott, who have underlying lung conditions are also at increaed risk for contracting COVID 19 and complications, up to and inculding death, if they should get this virus. Due to these factors, I consider Morgan Abbott to be at high risk for novel coronavirus and she should continue to e out of work from February 15, 2019 through March 16, 2019.       Current  Medication: Outpatient Encounter Medications as of 02/11/2019  Medication Sig  . betamethasone dipropionate (DIPROLENE) 0.05 % cream Apply topically 2 (two) times daily.  . diazepam (VALIUM) 5 MG tablet Take 1/2 to 1 tablet po QHS prn anxiety  . ibuprofen (ADVIL,MOTRIN) 800 MG tablet Take 800 mg by mouth 3 (three) times daily as needed.  Marland Kitchen omeprazole (PRILOSEC) 40 MG capsule Take 1 capsule (40 mg total) by mouth daily.  . ondansetron (ZOFRAN) 4 MG tablet Take 1 tablet (4 mg total) by mouth every 8 (eight) hours as needed for nausea or vomiting.  Marland Kitchen oxyCODONE-acetaminophen (PERCOCET) 10-325 MG tablet TAKE 1 TABLET BY MOUTH EVERY 4 TO 6 HOURS AS NEEDED *MAX OF 5 A DAY* *DNF UNTIL 10/10/17*  . triamcinolone (KENALOG) 0.025 % cream Apply 1 application topically 2 (two) times daily as needed.  Marland Kitchen amoxicillin (AMOXIL) 875 MG tablet Take 1 tablet (875 mg total) by mouth 2 (two) times daily. (Patient not taking: Reported on 02/11/2019)  . [DISCONTINUED] azithromycin (ZITHROMAX) 250 MG tablet Use as directed   No facility-administered encounter medications on file as of 02/11/2019.     Surgical History: Past Surgical History:  Procedure Laterality Date  . CHOLECYSTECTOMY  1996  . COLON RESECTION    . dilatation and curettage    . TONSILLECTOMY Bilateral     Medical History: Past Medical History:  Diagnosis Date  . Allergic rhinitis   . Arthritis   . Bronchitis   . Hyperlipidemia   . Migraines  Family History: History reviewed. No pertinent family history.  Social History   Socioeconomic History  . Marital status: Married    Spouse name: Not on file  . Number of children: Not on file  . Years of education: Not on file  . Highest education level: Not on file  Occupational History  . Not on file  Social Needs  . Financial resource strain: Not on file  . Food insecurity:    Worry: Not on file    Inability: Not on file  . Transportation needs:    Medical: Not on file     Non-medical: Not on file  Tobacco Use  . Smoking status: Never Smoker  . Smokeless tobacco: Never Used  Substance and Sexual Activity  . Alcohol use: No    Frequency: Never  . Drug use: No  . Sexual activity: Not on file  Lifestyle  . Physical activity:    Days per week: Not on file    Minutes per session: Not on file  . Stress: Not on file  Relationships  . Social connections:    Talks on phone: Not on file    Gets together: Not on file    Attends religious service: Not on file    Active member of club or organization: Not on file    Attends meetings of clubs or organizations: Not on file    Relationship status: Not on file  . Intimate partner violence:    Fear of current or ex partner: Not on file    Emotionally abused: Not on file    Physically abused: Not on file    Forced sexual activity: Not on file  Other Topics Concern  . Not on file  Social History Narrative  . Not on file      Review of Systems  Constitutional: Positive for fatigue. Negative for activity change, appetite change and chills.  HENT: Negative for congestion, postnasal drip, sinus pressure and sore throat.   Respiratory: Negative for cough, chest tightness, shortness of breath and wheezing.        History of asthma and community acquired pneumonia.   Cardiovascular: Negative for chest pain and palpitations.  Gastrointestinal: Negative for abdominal pain, nausea and vomiting.  Endocrine: Negative for cold intolerance, heat intolerance, polydipsia, polyphagia and polyuria.  Genitourinary: Negative for frequency.       Uti symptoms have resolved.   Musculoskeletal: Positive for arthralgias.  Allergic/Immunologic: Negative.   Neurological: Positive for headaches.  Hematological: Negative for adenopathy. Does not bruise/bleed easily.  Psychiatric/Behavioral: The patient is nervous/anxious.   All other systems reviewed and are negative.   Today's Vitals   02/11/19 1114  Weight: 165 lb (74.8 kg)   Height:  (1.6 m)   Body mass index is 29.23 kg/m.  Observation/Objective:   The patient is alert and oriented. She is pleasant and answers all questions appropriately. Breathing is non-labored. She is in no acute distress at this time.    Assessment/Plan: 1. Mild intermittent asthma without complication Patient with history of asthma and community acquired pneumonia. Has been out of work since March due to COVID 19. She needs to have a work note keeping her out of work through march of June, 2020, and longer, as riask of COVID 19 continues to spread.   2. Gastroesophageal reflux disease without esophagitis Stable.   3. Vitamin B12 deficiency Continue b12 injections as needed   4. GAD (generalized anxiety disorder) May take diazepam as needed and as prescribed  General Counseling: Morgan Abbott verbalizes understanding of the findings of today's phone visit and agrees with plan of treatment. I have discussed any further diagnostic evaluation that may be needed or ordered today. We also reviewed her medications today. she has been encouraged to call the office with any questions or concerns that should arise related to todays visit.  This patient was seen by Vincent GrosHeather Keshayla Schrum FNP Collaboration with Dr Lyndon CodeFozia M Khan as a part of collaborative care agreement  Time spent:25 Minutes    Dr Lyndon CodeFozia M Khan Internal medicine

## 2019-02-17 ENCOUNTER — Telehealth: Payer: Self-pay

## 2019-02-17 DIAGNOSIS — M069 Rheumatoid arthritis, unspecified: Secondary | ICD-10-CM | POA: Diagnosis not present

## 2019-02-17 DIAGNOSIS — M25561 Pain in right knee: Secondary | ICD-10-CM | POA: Diagnosis not present

## 2019-02-17 DIAGNOSIS — M48061 Spinal stenosis, lumbar region without neurogenic claudication: Secondary | ICD-10-CM | POA: Diagnosis not present

## 2019-02-17 DIAGNOSIS — M25562 Pain in left knee: Secondary | ICD-10-CM | POA: Diagnosis not present

## 2019-02-17 NOTE — Telephone Encounter (Signed)
I have not. What days did she miss work?

## 2019-02-18 NOTE — Telephone Encounter (Signed)
In her visit note it said she needed one from June 1st to June 30th, due to her underlying conditions she is at high risk.

## 2019-02-22 DIAGNOSIS — F411 Generalized anxiety disorder: Secondary | ICD-10-CM | POA: Insufficient documentation

## 2019-02-22 DIAGNOSIS — K219 Gastro-esophageal reflux disease without esophagitis: Secondary | ICD-10-CM | POA: Insufficient documentation

## 2019-02-22 DIAGNOSIS — J4520 Mild intermittent asthma, uncomplicated: Secondary | ICD-10-CM | POA: Insufficient documentation

## 2019-02-22 NOTE — Telephone Encounter (Signed)
Faxed in to pt work per [employer]

## 2019-03-15 ENCOUNTER — Telehealth: Payer: Self-pay | Admitting: Nurse Practitioner

## 2019-03-16 NOTE — Telephone Encounter (Signed)
Morgan Abbott SPOKE WITH PATIENT AND GAVE DOCOTRS NOTE FOR 07-20

## 2019-03-18 ENCOUNTER — Telehealth: Payer: Self-pay

## 2019-03-18 NOTE — Telephone Encounter (Signed)
error 

## 2019-03-24 NOTE — Telephone Encounter (Signed)
Ask courtney, I think we got this done for her.

## 2019-03-26 NOTE — Telephone Encounter (Signed)
It was done on 03/22/2019, by Loma Sousa.

## 2019-03-29 ENCOUNTER — Other Ambulatory Visit: Payer: Self-pay

## 2019-03-29 MED ORDER — BETAMETHASONE DIPROPIONATE 0.05 % EX CREA: TOPICAL_CREAM | Freq: Two times a day (BID) | CUTANEOUS | 0 refills | Status: DC

## 2019-04-16 ENCOUNTER — Other Ambulatory Visit: Payer: Self-pay

## 2019-04-16 ENCOUNTER — Encounter: Payer: Self-pay | Admitting: Nurse Practitioner

## 2019-04-16 ENCOUNTER — Ambulatory Visit: Payer: Medicare Other | Admitting: Nurse Practitioner

## 2019-04-16 VITALS — Ht 63.0 in | Wt 167.0 lb

## 2019-04-16 DIAGNOSIS — F411 Generalized anxiety disorder: Secondary | ICD-10-CM | POA: Diagnosis not present

## 2019-04-16 DIAGNOSIS — J4520 Mild intermittent asthma, uncomplicated: Secondary | ICD-10-CM | POA: Diagnosis not present

## 2019-04-16 NOTE — Progress Notes (Signed)
[facility] [address] [place], [place] [address]  Internal MEDICINE  Telephone Visit  Patient Name: Morgan Abbott  [id number]  [id number]  Date of Service: 04/16/2019  I connected with the patient at 4:36pm by telephone and verified the patients identity using two identifiers.   I discussed the limitations, risks, security and privacy concerns of performing an evaluation and management service by telephone and the availability of in person appointments. I also discussed with the patient that there may be a patient responsible charge related to the service.  The patient expressed understanding and agrees to proceed.    Chief Complaint  Patient presents with  . Telephone Assessment  . Telephone Screen  . Medical Management of Chronic Issues    note to stay home due to high risk for covid     The patient has been contacted via telephone for follow up visit due to concerns for spread of novel coronavirus. The patient has been out of work since March due to novel coronavirus. She has been getting her normal pay and this will continue from August 1 through May 24, 2019. Her HR representative told her that, in order to continue this benefit, she needs to have a letter from her PCP stating that she is, in fact, at increased risk for contracting COVID 19 as well as complications from this disease. The patient is older than 72 years of age, which puts her, automatically at increased risk for contracting the virus and from suffering complications related to it. She has also had frequent episodes of pneumonia, most recent in 2018. Those, like Morgan Abbott, who have underlying lung conditions are also at increaed risk for contracting COVID 19 and complications, up to and inculding death, if they should get this virus. Due to these factors, I consider Morgan Abbott to be at high risk for novel coronavirus and she should continue to be out of work from 04/17/2019 through 05/24/2019.        Current Medication: Outpatient Encounter Medications as of 04/16/2019  Medication Sig  . betamethasone dipropionate (DIPROLENE) 0.05 % cream Apply topically 2 (two) times daily.  Marland Kitchen. ibuprofen (ADVIL,MOTRIN) 800 MG tablet Take 800 mg by mouth 3 (three) times daily as needed.  Marland Kitchen. omeprazole (PRILOSEC) 40 MG capsule Take 1 capsule (40 mg total) by mouth daily.  . ondansetron (ZOFRAN) 4 MG tablet Take 1 tablet (4 mg total) by mouth every 8 (eight) hours as needed for nausea or vomiting.  Marland Kitchen. oxyCODONE-acetaminophen (PERCOCET) 10-325 MG tablet TAKE 1 TABLET BY MOUTH EVERY 4 TO 6 HOURS AS NEEDED *MAX OF 5 A DAY* *DNF UNTIL 10/10/17*  . triamcinolone (KENALOG) 0.025 % cream Apply 1 application topically 2 (two) times daily as needed.  . diazepam (VALIUM) 5 MG tablet Take 1/2 to 1 tablet po QHS prn anxiety (Patient not taking: Reported on 04/16/2019)  . [DISCONTINUED] amoxicillin (AMOXIL) 875 MG tablet Take 1 tablet (875 mg total) by mouth 2 (two) times daily. (Patient not taking: Reported on 02/11/2019)   No facility-administered encounter medications on file as of 04/16/2019.     Surgical History: Past Surgical History:  Procedure Laterality Date  . CHOLECYSTECTOMY  1996  . COLON RESECTION    . dilatation and curettage    . TONSILLECTOMY Bilateral     Medical History: Past Medical History:  Diagnosis Date  . Allergic rhinitis   . Arthritis   . Bronchitis   . Hyperlipidemia   . Migraines     Family  History: History reviewed. No pertinent family history.  Social History   Socioeconomic History  . Marital status: Married    Spouse name: Not on file  . Number of children: Not on file  . Years of education: Not on file  . Highest education level: Not on file  Occupational History  . Not on file  Social Needs  . Financial resource strain: Not on file  . Food insecurity    Worry: Not on file    Inability: Not on file  . Transportation needs    Medical: Not on file     Non-medical: Not on file  Tobacco Use  . Smoking status: Never Smoker  . Smokeless tobacco: Never Used  Substance and Sexual Activity  . Alcohol use: No    Frequency: Never  . Drug use: No  . Sexual activity: Not on file  Lifestyle  . Physical activity    Days per week: Not on file    Minutes per session: Not on file  . Stress: Not on file  Relationships  . Social Herbalist on phone: Not on file    Gets together: Not on file    Attends religious service: Not on file    Active member of club or organization: Not on file    Attends meetings of clubs or organizations: Not on file    Relationship status: Not on file  . Intimate partner violence    Fear of current or ex partner: Not on file    Emotionally abused: Not on file    Physically abused: Not on file    Forced sexual activity: Not on file  Other Topics Concern  . Not on file  Social History Narrative  . Not on file      Review of Systems  Constitutional: Negative for activity change, appetite change, chills and fatigue.  HENT: Negative for congestion, postnasal drip, sinus pressure and sore throat.   Respiratory: Negative for cough, chest tightness, shortness of breath and wheezing.        History of asthma and community acquired pneumonia.   Cardiovascular: Negative for chest pain and palpitations.  Gastrointestinal: Negative for abdominal pain, nausea and vomiting.  Endocrine: Negative for cold intolerance, heat intolerance, polydipsia and polyuria.  Musculoskeletal: Positive for arthralgias.  Allergic/Immunologic: Negative.   Neurological: Positive for headaches.  Hematological: Negative for adenopathy. Does not bruise/bleed easily.  Psychiatric/Behavioral: The patient is nervous/anxious.   All other systems reviewed and are negative.   Today's Vitals   04/16/19 1354  Weight: 167 lb (75.8 kg)  Height: 5\' 3"  (1.6 m)   Body mass index is 29.58 kg/m.   Observation/Objective:   The patient is  alert and oriented. She is pleasant and answers all questions appropriately. Breathing is non-labored. She is in no acute distress at this time.    Assessment/Plan: 1. Mild intermittent asthma without complication The patient's employer will be given a note keeping her out of work from 04/17/2019 through 05/24/2019 due to risk of contracting COVID 19 and complications which can occur if she were to contract this virus. tis will be faxed to her employer   2. GAD (generalized anxiety disorder) May take diazepam as needed and as prescribed   General Counseling: Morgan Abbott understanding of the findings of today's phone visit and agrees with plan of treatment. I have discussed any further diagnostic evaluation that may be needed or ordered today. We also reviewed her medications today. she has been encouraged  to call the office with any questions or concerns that should arise related to todays visit.  This patient was seen by Vincent GrosHeather Cap Massi FNP Collaboration with Dr Lyndon CodeFozia M Khan as a part of collaborative care agreement  Time spent: 25 Minutes    Dr Lyndon CodeFozia M Khan Internal medicine

## 2019-04-19 DIAGNOSIS — M25819 Other specified joint disorders, unspecified shoulder: Secondary | ICD-10-CM | POA: Diagnosis not present

## 2019-04-19 DIAGNOSIS — M069 Rheumatoid arthritis, unspecified: Secondary | ICD-10-CM | POA: Diagnosis not present

## 2019-04-19 DIAGNOSIS — M25811 Other specified joint disorders, right shoulder: Secondary | ICD-10-CM | POA: Diagnosis not present

## 2019-04-19 DIAGNOSIS — M48061 Spinal stenosis, lumbar region without neurogenic claudication: Secondary | ICD-10-CM | POA: Diagnosis not present

## 2019-04-19 DIAGNOSIS — M25812 Other specified joint disorders, left shoulder: Secondary | ICD-10-CM | POA: Diagnosis not present

## 2019-04-19 DIAGNOSIS — M1991 Primary osteoarthritis, unspecified site: Secondary | ICD-10-CM | POA: Diagnosis not present

## 2019-04-20 ENCOUNTER — Encounter: Payer: Self-pay | Admitting: Nurse Practitioner

## 2019-05-20 ENCOUNTER — Ambulatory Visit: Payer: Medicare Other | Admitting: Nurse Practitioner

## 2019-06-24 DIAGNOSIS — M25561 Pain in right knee: Secondary | ICD-10-CM | POA: Diagnosis not present

## 2019-06-24 DIAGNOSIS — M1991 Primary osteoarthritis, unspecified site: Secondary | ICD-10-CM | POA: Diagnosis not present

## 2019-06-24 DIAGNOSIS — M25562 Pain in left knee: Secondary | ICD-10-CM | POA: Diagnosis not present

## 2019-06-24 DIAGNOSIS — M48061 Spinal stenosis, lumbar region without neurogenic claudication: Secondary | ICD-10-CM | POA: Diagnosis not present

## 2019-08-09 ENCOUNTER — Other Ambulatory Visit: Payer: Self-pay

## 2019-08-09 MED ORDER — BETAMETHASONE DIPROPIONATE 0.05 % EX CREA: TOPICAL_CREAM | Freq: Two times a day (BID) | CUTANEOUS | 0 refills | Status: DC

## 2019-08-26 DIAGNOSIS — M25819 Other specified joint disorders, unspecified shoulder: Secondary | ICD-10-CM | POA: Diagnosis not present

## 2019-08-26 DIAGNOSIS — M069 Rheumatoid arthritis, unspecified: Secondary | ICD-10-CM | POA: Diagnosis not present

## 2019-08-26 DIAGNOSIS — M48061 Spinal stenosis, lumbar region without neurogenic claudication: Secondary | ICD-10-CM | POA: Diagnosis not present

## 2019-08-26 DIAGNOSIS — M1991 Primary osteoarthritis, unspecified site: Secondary | ICD-10-CM | POA: Diagnosis not present

## 2019-10-01 ENCOUNTER — Telehealth: Payer: Self-pay

## 2019-10-01 NOTE — Telephone Encounter (Signed)
Called lmom informing patient need to schedule physical. klh

## 2019-10-19 DIAGNOSIS — M48061 Spinal stenosis, lumbar region without neurogenic claudication: Secondary | ICD-10-CM | POA: Diagnosis not present

## 2019-10-19 DIAGNOSIS — M1991 Primary osteoarthritis, unspecified site: Secondary | ICD-10-CM | POA: Diagnosis not present

## 2019-10-19 DIAGNOSIS — M069 Rheumatoid arthritis, unspecified: Secondary | ICD-10-CM | POA: Diagnosis not present

## 2019-10-19 DIAGNOSIS — M25819 Other specified joint disorders, unspecified shoulder: Secondary | ICD-10-CM | POA: Diagnosis not present

## 2019-10-25 ENCOUNTER — Other Ambulatory Visit: Payer: Self-pay

## 2019-10-25 DIAGNOSIS — K219 Gastro-esophageal reflux disease without esophagitis: Secondary | ICD-10-CM

## 2019-10-25 MED ORDER — OMEPRAZOLE 40 MG PO CPDR: 40.0000 mg | DELAYED_RELEASE_CAPSULE | Freq: Every day | ORAL | 0 refills | Status: DC

## 2019-11-17 ENCOUNTER — Other Ambulatory Visit: Payer: Self-pay

## 2019-11-17 DIAGNOSIS — K219 Gastro-esophageal reflux disease without esophagitis: Secondary | ICD-10-CM

## 2019-11-17 MED ORDER — OMEPRAZOLE 40 MG PO CPDR: 40.0000 mg | DELAYED_RELEASE_CAPSULE | Freq: Every day | ORAL | 0 refills | Status: DC

## 2019-12-14 ENCOUNTER — Telehealth: Payer: Self-pay

## 2019-12-14 NOTE — Telephone Encounter (Signed)
LMOM FOR PATIENT TO CONFIRM AND SCREEN FOR 12-16-19 OV. 

## 2019-12-16 ENCOUNTER — Other Ambulatory Visit: Payer: Self-pay

## 2019-12-16 ENCOUNTER — Ambulatory Visit: Payer: Medicare Other | Admitting: Nurse Practitioner

## 2019-12-16 ENCOUNTER — Encounter: Payer: Self-pay | Admitting: Nurse Practitioner

## 2019-12-16 VITALS — BP 134/75 | HR 93 | Temp 97.2°F | Resp 16 | Ht 63.0 in | Wt 156.8 lb

## 2019-12-16 DIAGNOSIS — R5383 Other fatigue: Secondary | ICD-10-CM

## 2019-12-16 DIAGNOSIS — E538 Deficiency of other specified B group vitamins: Secondary | ICD-10-CM

## 2019-12-16 DIAGNOSIS — K219 Gastro-esophageal reflux disease without esophagitis: Secondary | ICD-10-CM | POA: Diagnosis not present

## 2019-12-16 DIAGNOSIS — F321 Major depressive disorder, single episode, moderate: Secondary | ICD-10-CM

## 2019-12-16 MED ORDER — CITALOPRAM HYDROBROMIDE 10 MG PO TABS: 10.0000 mg | ORAL_TABLET | Freq: Every day | ORAL | 3 refills | Status: DC

## 2019-12-16 MED ORDER — OMEPRAZOLE 40 MG PO CPDR: 40.0000 mg | DELAYED_RELEASE_CAPSULE | Freq: Every day | ORAL | 3 refills | Status: DC

## 2019-12-16 MED ORDER — CYANOCOBALAMIN 1000 MCG/ML IJ SOLN
1000.0000 ug | Freq: Once | INTRAMUSCULAR | Status: AC
  Administered 2019-12-16: 17:00:00 1000 ug via INTRAMUSCULAR

## 2019-12-16 NOTE — Progress Notes (Signed)
[facility] [address] [place], [place] [address]  Internal MEDICINE  Office Visit Note  Patient Name: Morgan Abbott  [id number]  [id number]  Date of Service: 01/01/2020  Chief Complaint  Patient presents with  . Hyperlipidemia  . Arthritis    The patient is here for follow up visit. She recently returned to work after being out of work after being out due to [employer] 19 for close to eight months. She states that she is feeling depressed. Feels hopeless and that there just isn't a point to "being here." she states that she doesn't want to do anything, doesn't want to go anywhere. Not enjoying her normal activities. She denies feeling suicidal and denies feelings of wanting to hurt anyone else.       Current Medication: Outpatient Encounter Medications as of 12/16/2019  Medication Sig  . betamethasone dipropionate 0.05 % cream Apply topically 2 (two) times daily.  Marland Kitchen ibuprofen (ADVIL,MOTRIN) 800 MG tablet Take 800 mg by mouth 3 (three) times daily as needed.  Marland Kitchen omeprazole (PRILOSEC) 40 MG capsule Take 1 capsule (40 mg total) by mouth daily.  Marland Kitchen oxyCODONE-acetaminophen (PERCOCET) 10-325 MG tablet TAKE 1 TABLET BY MOUTH EVERY 4 TO 6 HOURS AS NEEDED *MAX OF 5 A DAY* *DNF UNTIL 10/10/17*  . [DISCONTINUED] omeprazole (PRILOSEC) 40 MG capsule Take 1 capsule (40 mg total) by mouth daily.  . citalopram (CELEXA) 10 MG tablet Take 1 tablet (10 mg total) by mouth daily.  . [DISCONTINUED] diazepam (VALIUM) 5 MG tablet Take 1/2 to 1 tablet po QHS prn anxiety (Patient not taking: Reported on 12/16/2019)  . [DISCONTINUED] ondansetron (ZOFRAN) 4 MG tablet Take 1 tablet (4 mg total) by mouth every 8 (eight) hours as needed for nausea or vomiting. (Patient not taking: Reported on 12/16/2019)  . [DISCONTINUED] triamcinolone (KENALOG) 0.025 % cream Apply 1 application topically 2 (two) times daily as needed.  . [EXPIRED] cyanocobalamin ((VITAMIN B-12)) injection 1,000 mcg    No facility-administered  encounter medications on file as of 12/16/2019.    Surgical History: Past Surgical History:  Procedure Laterality Date  . CHOLECYSTECTOMY  1996  . COLON RESECTION    . dilatation and curettage    . TONSILLECTOMY Bilateral     Medical History: Past Medical History:  Diagnosis Date  . Allergic rhinitis   . Arthritis   . Bronchitis   . Hyperlipidemia   . Migraines     Family History: History reviewed. No pertinent family history.  Social History   Socioeconomic History  . Marital status: Married    Spouse name: Not on file  . Number of children: Not on file  . Years of education: Not on file  . Highest education level: Not on file  Occupational History  . Not on file  Tobacco Use  . Smoking status: Never Smoker  . Smokeless tobacco: Never Used  Substance and Sexual Activity  . Alcohol use: No  . Drug use: No  . Sexual activity: Not on file  Other Topics Concern  . Not on file  Social History Narrative  . Not on file   Social Determinants of Health   Financial Resource Strain:   . Difficulty of Paying Living Expenses:   Food Insecurity:   . Worried About Programme researcher, broadcasting/film/video in the Last Year:   . Barista in the Last Year:   Transportation Needs:   . Freight forwarder (Medical):   Marland Kitchen Lack of Transportation (Non-Medical):  Physical Activity:   . Days of Exercise per Week:   . Minutes of Exercise per Session:   Stress:   . Feeling of Stress :   Social Connections:   . Frequency of Communication with Friends and Family:   . Frequency of Social Gatherings with Friends and Family:   . Attends Religious Services:   . Active Member of Clubs or Organizations:   . Attends Archivist Meetings:   Marland Kitchen Marital Status:   Intimate Partner Violence:   . Fear of Current or Ex-Partner:   . Emotionally Abused:   Marland Kitchen Physically Abused:   . Sexually Abused:       Review of Systems  Constitutional: Positive for fatigue. Negative for activity change,  chills and unexpected weight change.  HENT: Negative for congestion, postnasal drip, rhinorrhea, sneezing and sore throat.   Respiratory: Negative for cough, chest tightness, shortness of breath and wheezing.   Cardiovascular: Negative for chest pain and palpitations.  Gastrointestinal: Negative for abdominal pain, constipation, diarrhea, nausea and vomiting.       Acid reflux.  Endocrine: Negative for cold intolerance, heat intolerance, polydipsia and polyuria.  Musculoskeletal: Negative for arthralgias, back pain, joint swelling and neck pain.  Skin: Negative for rash.  Allergic/Immunologic: Negative for environmental allergies.  Neurological: Negative for dizziness, tremors, numbness and headaches.  Hematological: Negative for adenopathy. Does not bruise/bleed easily.  Psychiatric/Behavioral: Positive for dysphoric mood. Negative for behavioral problems (Depression), sleep disturbance and suicidal ideas. The patient is nervous/anxious.     Today's Vitals   12/16/19 1605  BP: 134/75  Pulse: 93  Resp: 16  Temp: (!) 97.2 F (36.2 C)  SpO2: 97%  Weight: 156 lb 12.8 oz (71.1 kg)  Height: 5\' 3"  (1.6 m)   Body mass index is 27.78 kg/m.  Physical Exam Vitals and nursing note reviewed.  Constitutional:      General: She is not in acute distress.    Appearance: Normal appearance. She is well-developed. She is not diaphoretic.  HENT:     Head: Normocephalic and atraumatic.     Nose: Nose normal.     Mouth/Throat:     Pharynx: No oropharyngeal exudate.  Eyes:     Pupils: Pupils are equal, round, and reactive to light.  Neck:     Thyroid: No thyromegaly.     Vascular: No JVD.     Trachea: No tracheal deviation.  Cardiovascular:     Rate and Rhythm: Normal rate and regular rhythm.     Heart sounds: Normal heart sounds. No murmur. No friction rub. No gallop.   Pulmonary:     Effort: Pulmonary effort is normal. No respiratory distress.     Breath sounds: Normal breath sounds. No  wheezing or rales.  Chest:     Chest wall: No tenderness.  Abdominal:     Palpations: Abdomen is soft.  Musculoskeletal:        General: Normal range of motion.     Cervical back: Normal range of motion and neck supple.  Lymphadenopathy:     Cervical: No cervical adenopathy.  Skin:    General: Skin is warm and dry.  Neurological:     Mental Status: She is alert and oriented to person, place, and time.     Cranial Nerves: No cranial nerve deficit.  Psychiatric:        Attention and Perception: Attention and perception normal.        Mood and Affect: Mood is anxious and depressed. Affect  is tearful.        Speech: Speech normal.        Behavior: Behavior normal. Behavior is cooperative.        Thought Content: Thought content normal.        Cognition and Memory: Cognition and memory normal.        Judgment: Judgment normal.    Assessment/Plan: 1. Gastroesophageal reflux disease without esophagitis Continue omeprazole as prescribed  - omeprazole (PRILOSEC) 40 MG capsule; Take 1 capsule (40 mg total) by mouth daily.  Dispense: 90 capsule; Refill: 3  2. Other fatigue Possibly due to increased depression. Will check labs.   3. Depression, major, single episode, moderate (HCC) Start citalopram 10mg  daily. Reassess at next visit.  - citalopram (CELEXA) 10 MG tablet; Take 1 tablet (10 mg total) by mouth daily.  Dispense: 30 tablet; Refill: 3  4. B12 deficiency b12 injection administered today.  - cyanocobalamin ((VITAMIN B-12)) injection 1,000 mcg  General Counseling: Sulma verbalizes understanding of the findings of todays visit and agrees with plan of treatment. I have discussed any further diagnostic evaluation that may be needed or ordered today. We also reviewed her medications today. she has been encouraged to call the office with any questions or concerns that should arise related to todays visit.   This patient was seen by FNP Collaboration with Dr Vincent Gros as a part of collaborative care agreement  Meds ordered this encounter  Medications  . citalopram (CELEXA) 10 MG tablet    Sig: Take 1 tablet (10 mg total) by mouth daily.    Dispense:  30 tablet    Refill:  3    Order Specific Question:   Supervising Provider    Answer:   Lyndon Code [[id number]]  . omeprazole (PRILOSEC) 40 MG capsule    Sig: Take 1 capsule (40 mg total) by mouth daily.    Dispense:  90 capsule    Refill:  3    Order Specific Question:   Supervising Provider    Answer:   Lyndon Code [[id number]]  . cyanocobalamin ((VITAMIN B-12)) injection 1,000 mcg    Total time spent: 30 Minutes   Time spent includes review of chart, medications, test results, and follow up plan with the patient.      Dr Lyndon Code Internal medicine

## 2019-12-27 DIAGNOSIS — M25819 Other specified joint disorders, unspecified shoulder: Secondary | ICD-10-CM | POA: Diagnosis not present

## 2019-12-27 DIAGNOSIS — M48061 Spinal stenosis, lumbar region without neurogenic claudication: Secondary | ICD-10-CM | POA: Diagnosis not present

## 2019-12-27 DIAGNOSIS — M069 Rheumatoid arthritis, unspecified: Secondary | ICD-10-CM | POA: Diagnosis not present

## 2019-12-27 DIAGNOSIS — Z79891 Long term (current) use of opiate analgesic: Secondary | ICD-10-CM | POA: Diagnosis not present

## 2019-12-27 DIAGNOSIS — M1991 Primary osteoarthritis, unspecified site: Secondary | ICD-10-CM | POA: Diagnosis not present

## 2019-12-30 DIAGNOSIS — M25561 Pain in right knee: Secondary | ICD-10-CM | POA: Diagnosis not present

## 2019-12-30 DIAGNOSIS — M25562 Pain in left knee: Secondary | ICD-10-CM | POA: Diagnosis not present

## 2019-12-30 DIAGNOSIS — M545 Low back pain: Secondary | ICD-10-CM | POA: Diagnosis not present

## 2020-01-01 DIAGNOSIS — F321 Major depressive disorder, single episode, moderate: Secondary | ICD-10-CM | POA: Insufficient documentation

## 2020-01-01 DIAGNOSIS — F331 Major depressive disorder, recurrent, moderate: Secondary | ICD-10-CM | POA: Insufficient documentation

## 2020-01-01 DIAGNOSIS — R5383 Other fatigue: Secondary | ICD-10-CM | POA: Insufficient documentation

## 2020-01-09 ENCOUNTER — Other Ambulatory Visit: Payer: Self-pay | Admitting: Nurse Practitioner

## 2020-01-09 DIAGNOSIS — F321 Major depressive disorder, single episode, moderate: Secondary | ICD-10-CM

## 2020-01-11 DIAGNOSIS — M069 Rheumatoid arthritis, unspecified: Secondary | ICD-10-CM | POA: Diagnosis not present

## 2020-01-11 DIAGNOSIS — M1991 Primary osteoarthritis, unspecified site: Secondary | ICD-10-CM | POA: Diagnosis not present

## 2020-01-11 DIAGNOSIS — M25819 Other specified joint disorders, unspecified shoulder: Secondary | ICD-10-CM | POA: Diagnosis not present

## 2020-01-11 DIAGNOSIS — M48061 Spinal stenosis, lumbar region without neurogenic claudication: Secondary | ICD-10-CM | POA: Diagnosis not present

## 2020-01-27 ENCOUNTER — Ambulatory Visit: Payer: Medicare Other | Admitting: Nurse Practitioner

## 2020-03-02 ENCOUNTER — Ambulatory Visit: Payer: Medicare Other | Admitting: Nurse Practitioner

## 2020-03-14 ENCOUNTER — Telehealth: Payer: Self-pay

## 2020-03-14 NOTE — Telephone Encounter (Signed)
Lmom to confirm and screen for 03-16-20 ov. 

## 2020-03-16 ENCOUNTER — Ambulatory Visit: Payer: Medicare Other | Admitting: Nurse Practitioner

## 2020-03-28 ENCOUNTER — Other Ambulatory Visit: Payer: Self-pay | Admitting: Nurse Practitioner

## 2020-03-28 DIAGNOSIS — Z0001 Encounter for general adult medical examination with abnormal findings: Secondary | ICD-10-CM | POA: Diagnosis not present

## 2020-03-28 DIAGNOSIS — E559 Vitamin D deficiency, unspecified: Secondary | ICD-10-CM | POA: Diagnosis not present

## 2020-03-28 DIAGNOSIS — R5383 Other fatigue: Secondary | ICD-10-CM | POA: Diagnosis not present

## 2020-03-28 DIAGNOSIS — R7301 Impaired fasting glucose: Secondary | ICD-10-CM | POA: Diagnosis not present

## 2020-03-29 LAB — COMPREHENSIVE METABOLIC PANEL
ALT: 14 IU/L (ref 0–32)
AST: 25 IU/L (ref 0–40)
Albumin/Globulin Ratio: 1.6 (ref 1.2–2.2)
Albumin: 4.2 g/dL (ref 3.7–4.7)
Alkaline Phosphatase: 102 IU/L (ref 48–121)
BUN/Creatinine Ratio: 16 (ref 12–28)
BUN: 15 mg/dL (ref 8–27)
Bilirubin Total: 1.7 mg/dL — ABNORMAL HIGH (ref 0.0–1.2)
CO2: 24 mmol/L (ref 20–29)
Calcium: 9.6 mg/dL (ref 8.7–10.3)
Chloride: 104 mmol/L (ref 96–106)
Creatinine, Ser: 0.94 mg/dL (ref 0.57–1.00)
GFR calc Af Amer: 70 mL/min/{1.73_m2} (ref >59)
GFR calc non Af Amer: 60 mL/min/{1.73_m2} (ref >59)
Globulin, Total: 2.6 g/dL (ref 1.5–4.5)
Glucose: 103 mg/dL — ABNORMAL HIGH (ref 65–99)
Potassium: 5.4 mmol/L — ABNORMAL HIGH (ref 3.5–5.2)
Sodium: 140 mmol/L (ref 134–144)
Total Protein: 6.8 g/dL (ref 6.0–8.5)

## 2020-03-29 LAB — LIPID PANEL W/O CHOL/HDL RATIO
Cholesterol, Total: 181 mg/dL (ref 100–199)
HDL: 60 mg/dL (ref >39)
LDL Chol Calc (NIH): 103 mg/dL — ABNORMAL HIGH (ref 0–99)
Triglycerides: 98 mg/dL (ref 0–149)
VLDL Cholesterol Cal: 18 mg/dL (ref 5–40)

## 2020-03-29 LAB — CBC
Hematocrit: 36.1 % (ref 34.0–46.6)
Hemoglobin: 12.5 g/dL (ref 11.1–15.9)
MCH: 29.8 pg (ref 26.6–33.0)
MCHC: 34.6 g/dL (ref 31.5–35.7)
MCV: 86 fL (ref 79–97)
Platelets: 201 10*3/uL (ref 150–450)
RBC: 4.2 x10E6/uL (ref 3.77–5.28)
RDW: 12.6 % (ref 11.7–15.4)
WBC: 5.9 10*3/uL (ref 3.4–10.8)

## 2020-03-29 LAB — TSH: TSH: 1.72 u[IU]/mL (ref 0.450–4.500)

## 2020-03-29 LAB — VITAMIN D 25 HYDROXY (VIT D DEFICIENCY, FRACTURES): Vit D, 25-Hydroxy: 13.2 ng/mL — ABNORMAL LOW (ref 30.0–100.0)

## 2020-03-29 LAB — HGB A1C W/O EAG: Hgb A1c MFr Bld: 5.3 % (ref 4.8–5.6)

## 2020-03-29 LAB — T4, FREE: Free T4: 1.25 ng/dL (ref 0.82–1.77)

## 2020-04-02 NOTE — Progress Notes (Signed)
Treeat for vitamin d deficiency. Discuss at visit 04/07/2020.

## 2020-04-05 ENCOUNTER — Telehealth: Payer: Self-pay

## 2020-04-05 NOTE — Telephone Encounter (Signed)
Lmom to confirm and screen for 04-07-20 ov. 

## 2020-04-06 ENCOUNTER — Telehealth: Payer: Self-pay

## 2020-04-06 DIAGNOSIS — M48061 Spinal stenosis, lumbar region without neurogenic claudication: Secondary | ICD-10-CM | POA: Diagnosis not present

## 2020-04-06 DIAGNOSIS — M25819 Other specified joint disorders, unspecified shoulder: Secondary | ICD-10-CM | POA: Diagnosis not present

## 2020-04-06 DIAGNOSIS — M1991 Primary osteoarthritis, unspecified site: Secondary | ICD-10-CM | POA: Diagnosis not present

## 2020-04-06 DIAGNOSIS — M069 Rheumatoid arthritis, unspecified: Secondary | ICD-10-CM | POA: Diagnosis not present

## 2020-04-06 NOTE — Telephone Encounter (Signed)
Confirmed appointment on 04/07/2020 and screened for covid. klh 

## 2020-04-07 ENCOUNTER — Ambulatory Visit: Payer: Medicare Other | Admitting: Nurse Practitioner

## 2020-04-07 ENCOUNTER — Other Ambulatory Visit: Payer: Self-pay

## 2020-04-07 ENCOUNTER — Encounter: Payer: Self-pay | Admitting: Nurse Practitioner

## 2020-04-07 VITALS — BP 127/65 | HR 70 | Temp 97.6°F | Resp 16 | Ht 63.0 in | Wt 159.4 lb

## 2020-04-07 DIAGNOSIS — R300 Dysuria: Secondary | ICD-10-CM

## 2020-04-07 DIAGNOSIS — E538 Deficiency of other specified B group vitamins: Secondary | ICD-10-CM

## 2020-04-07 DIAGNOSIS — K219 Gastro-esophageal reflux disease without esophagitis: Secondary | ICD-10-CM

## 2020-04-07 DIAGNOSIS — F321 Major depressive disorder, single episode, moderate: Secondary | ICD-10-CM | POA: Diagnosis not present

## 2020-04-07 DIAGNOSIS — Z23 Encounter for immunization: Secondary | ICD-10-CM

## 2020-04-07 DIAGNOSIS — Z1231 Encounter for screening mammogram for malignant neoplasm of breast: Secondary | ICD-10-CM

## 2020-04-07 LAB — POCT URINALYSIS DIPSTICK
Glucose, UA: NEGATIVE
Leukocytes, UA: NEGATIVE
Nitrite, UA: NEGATIVE
Protein, UA: NEGATIVE
Spec Grav, UA: 1.02 (ref 1.010–1.025)
Urobilinogen, UA: 0.2 E.U./dL
pH, UA: 5 (ref 5.0–8.0)

## 2020-04-07 MED ORDER — CYANOCOBALAMIN 1000 MCG/ML IJ SOLN
1000.0000 ug | Freq: Once | INTRAMUSCULAR | Status: AC
  Administered 2020-04-07: 1000 ug via INTRAMUSCULAR

## 2020-04-07 MED ORDER — PNEUMOCOCCAL 13-VAL CONJ VACC IM SUSP: 0.5000 mL | Freq: Once | INTRAMUSCULAR | 0 refills | Status: AC

## 2020-04-07 MED ORDER — DULOXETINE HCL 20 MG PO CPEP: 20.0000 mg | ORAL_CAPSULE | Freq: Every day | ORAL | 3 refills | Status: DC

## 2020-04-07 NOTE — Progress Notes (Signed)
[facility] [address] [place], [place] [address]  Internal MEDICINE  Office Visit Note  Patient Name: Morgan Abbott  [id number]  [id number]  Date of Service: 05/06/2020  Chief Complaint  Patient presents with  . Follow-up  . Hyperlipidemia  . Quality Metric Gaps    TDAP, DEXA, Mammogram, PNA Vaccine  . Urinary Tract Infection    Small odor, lower back pain    The patient is here for follow up visit. She had been feeling depressed. Felt hopeless and that there just isn't a point to "being here." she states that she doesn't want to do anything, doesn't want to go anywhere. Not enjoying her normal activities. Changed her from citalopam to duloxetine 20mg  daily. She states that she feels as though her mood has improved while taking this medication. No longer feeling hopeless. Is back to enjoying some of her regular activities.  The patient states that she has had some intermittent urinary frequency and urgency. Urine sample is showing trace of blood today. There is no evidence of infection or other abnormalities. She is due to have a pneumonia vaccine and b12 injection.       Current Medication: Outpatient Encounter Medications as of 04/07/2020  Medication Sig Note  . betamethasone dipropionate 0.05 % cream Apply topically 2 (two) times daily.   04/09/2020 ibuprofen (ADVIL,MOTRIN) 800 MG tablet Take 800 mg by mouth 3 (three) times daily as needed.   Marland Kitchen omeprazole (PRILOSEC) 40 MG capsule Take 1 capsule (40 mg total) by mouth daily.   Marland Kitchen oxyCODONE-acetaminophen (PERCOCET) 10-325 MG tablet TAKE 1 TABLET BY MOUTH EVERY 4 TO 6 HOURS AS NEEDED *MAX OF 5 A DAY* *DNF UNTIL 10/10/17*   . [DISCONTINUED] citalopram (CELEXA) 10 MG tablet TAKE 1 TABLET BY MOUTH EVERY DAY 04/07/2020: negative side effects. caused headache  . [EXPIRED] pneumococcal 13-valent conjugate vaccine (PREVNAR 13) SUSP injection Inject 0.5 mLs into the muscle once for 1 dose.   . [DISCONTINUED] DULoxetine (CYMBALTA) 20 MG  capsule Take 1 capsule (20 mg total) by mouth daily.   . [EXPIRED] cyanocobalamin ((VITAMIN B-12)) injection 1,000 mcg     No facility-administered encounter medications on file as of 04/07/2020.    Surgical History: Past Surgical History:  Procedure Laterality Date  . CHOLECYSTECTOMY  1996  . COLON RESECTION    . dilatation and curettage    . TONSILLECTOMY Bilateral     Medical History: Past Medical History:  Diagnosis Date  . Allergic rhinitis   . Arthritis   . Bronchitis   . Hyperlipidemia   . Migraines     Family History: History reviewed. No pertinent family history.  Social History   Socioeconomic History  . Marital status: Married    Spouse name: Not on file  . Number of children: Not on file  . Years of education: Not on file  . Highest education level: Not on file  Occupational History  . Not on file  Tobacco Use  . Smoking status: Never Smoker  . Smokeless tobacco: Never Used  Substance and Sexual Activity  . Alcohol use: No  . Drug use: No  . Sexual activity: Not on file  Other Topics Concern  . Not on file  Social History Narrative  . Not on file   Social Determinants of Health   Financial Resource Strain:   . Difficulty of Paying Living Expenses: Not on file  Food Insecurity:   . Worried About 04/09/2020 in the Last Year: Not  on file  . Ran Out of Food in the Last Year: Not on file  Transportation Needs:   . Lack of Transportation (Medical): Not on file  . Lack of Transportation (Non-Medical): Not on file  Physical Activity:   . Days of Exercise per Week: Not on file  . Minutes of Exercise per Session: Not on file  Stress:   . Feeling of Stress : Not on file  Social Connections:   . Frequency of Communication with Friends and Family: Not on file  . Frequency of Social Gatherings with Friends and Family: Not on file  . Attends Religious Services: Not on file  . Active Member of Clubs or Organizations: Not on file  . Attends  Banker Meetings: Not on file  . Marital Status: Not on file  Intimate Partner Violence:   . Fear of Current or Ex-Partner: Not on file  . Emotionally Abused: Not on file  . Physically Abused: Not on file  . Sexually Abused: Not on file      Review of Systems  Constitutional: Positive for fatigue. Negative for activity change, chills and unexpected weight change.       Improved fatigue.   HENT: Negative for congestion, postnasal drip, rhinorrhea, sneezing and sore throat.   Respiratory: Negative for cough, chest tightness, shortness of breath and wheezing.   Cardiovascular: Negative for chest pain and palpitations.  Gastrointestinal: Negative for abdominal pain, constipation, diarrhea, nausea and vomiting.  Endocrine: Negative for cold intolerance, heat intolerance, polydipsia and polyuria.  Genitourinary: Positive for frequency and urgency.  Musculoskeletal: Negative for arthralgias, back pain, joint swelling and neck pain.  Skin: Negative for rash.  Allergic/Immunologic: Negative for environmental allergies.  Neurological: Negative for dizziness, tremors, numbness and headaches.  Hematological: Negative for adenopathy. Does not bruise/bleed easily.  Psychiatric/Behavioral: Positive for dysphoric mood. Negative for behavioral problems (Depression), sleep disturbance and suicidal ideas. The patient is nervous/anxious.        She is having improved mood since changing from citalopram to duloxetine.     Today's Vitals   04/07/20 1450  BP: 127/65  Pulse: 70  Resp: 16  Temp: 97.6 F (36.4 C)  SpO2: 98%  Weight: 159 lb 6.4 oz (72.3 kg)  Height: 5\' 3"  (1.6 m)   Body mass index is 28.24 kg/m.  Physical Exam Vitals and nursing note reviewed.  Constitutional:      General: She is not in acute distress.    Appearance: Normal appearance. She is well-developed. She is not diaphoretic.  HENT:     Head: Normocephalic and atraumatic.     Nose: Nose normal.      Mouth/Throat:     Pharynx: No oropharyngeal exudate.  Eyes:     Pupils: Pupils are equal, round, and reactive to light.  Neck:     Thyroid: No thyromegaly.     Vascular: No JVD.     Trachea: No tracheal deviation.  Cardiovascular:     Rate and Rhythm: Normal rate and regular rhythm.     Heart sounds: Normal heart sounds. No murmur heard.  No friction rub. No gallop.   Pulmonary:     Effort: Pulmonary effort is normal. No respiratory distress.     Breath sounds: Normal breath sounds. No wheezing or rales.  Chest:     Chest wall: No tenderness.  Abdominal:     Palpations: Abdomen is soft.  Genitourinary:    Comments: Urine sample positive for trace blood. Musculoskeletal:  General: Normal range of motion.     Cervical back: Normal range of motion and neck supple.  Lymphadenopathy:     Cervical: No cervical adenopathy.  Skin:    General: Skin is warm and dry.  Neurological:     Mental Status: She is alert and oriented to person, place, and time.     Cranial Nerves: No cranial nerve deficit.  Psychiatric:        Attention and Perception: Attention and perception normal.        Mood and Affect: Mood is anxious and depressed. Affect is tearful.        Speech: Speech normal.        Behavior: Behavior normal. Behavior is cooperative.        Thought Content: Thought content normal.        Cognition and Memory: Cognition and memory normal.        Judgment: Judgment normal.     Comments: Mood is improved since her most recent visit    Assessment/Plan: 1. Gastroesophageal reflux disease without esophagitis Stable. Continue omeprazole as prescribed   2. B12 deficiency b12 injection administered in the office today.  - cyanocobalamin ((VITAMIN B-12)) injection 1,000 mcg  3. Depression, major, single episode, moderate (HCC) Improved. Continue duloxetine as prescribed.   4. Encounter for screening mammogram for malignant neoplasm of breast - MM DIGITAL SCREENING BILATERAL;  Future  5. Need for vaccination against Streptococcus pneumoniae using pneumococcal conjugate vaccine 13 Prescription for Prevnar 13 sent to her pharmacy for administration today.  - pneumococcal 13-valent conjugate vaccine (PREVNAR 13) SUSP injection; Inject 0.5 mLs into the muscle once for 1 dose.  Dispense: 0.5 mL; Refill: 0  6. Dysuria - POCT Urinalysis Dipstick trace blood present today. Will send urine for culture and sensitivity and treat as indicated for infection.  - CULTURE, URINE COMPREHENSIVE  General Counseling: Shuntel verbalizes understanding of the findings of todays visit and agrees with plan of treatment. I have discussed any further diagnostic evaluation that may be needed or ordered today. We also reviewed her medications today. she has been encouraged to call the office with any questions or concerns that should arise related to todays visit.  This patient was seen by Vincent Gros FNP Collaboration with Dr Lyndon Code as a part of collaborative care agreement  Orders Placed This Encounter  Procedures  . CULTURE, URINE COMPREHENSIVE  . MM DIGITAL SCREENING BILATERAL  . POCT Urinalysis Dipstick    Meds ordered this encounter  Medications  . pneumococcal 13-valent conjugate vaccine (PREVNAR 13) SUSP injection    Sig: Inject 0.5 mLs into the muscle once for 1 dose.    Dispense:  0.5 mL    Refill:  0    Order Specific Question:   Supervising Provider    Answer:   Lyndon Code [[id number]]  . DISCONTD: DULoxetine (CYMBALTA) 20 MG capsule    Sig: Take 1 capsule (20 mg total) by mouth daily.    Dispense:  30 capsule    Refill:  3    Order Specific Question:   Supervising Provider    Answer:   Lyndon Code [[id number]]  . cyanocobalamin ((VITAMIN B-12)) injection 1,000 mcg    Total time spent: 30 Minutes   Time spent includes review of chart, medications, test results, and follow up plan with the patient.      Dr Lyndon Code Internal medicine

## 2020-04-10 ENCOUNTER — Other Ambulatory Visit: Payer: Self-pay

## 2020-04-10 ENCOUNTER — Telehealth: Payer: Self-pay | Admitting: Nurse Practitioner

## 2020-04-10 MED ORDER — ERGOCALCIFEROL 1.25 MG (50000 UT) PO CAPS: 50000.0000 [IU] | ORAL_CAPSULE | ORAL | 3 refills | Status: DC

## 2020-04-11 LAB — CULTURE, URINE COMPREHENSIVE

## 2020-04-12 NOTE — Telephone Encounter (Signed)
Sent and forwarded message to nurse.

## 2020-05-03 ENCOUNTER — Other Ambulatory Visit: Payer: Self-pay | Admitting: Nurse Practitioner

## 2020-05-03 DIAGNOSIS — F321 Major depressive disorder, single episode, moderate: Secondary | ICD-10-CM

## 2020-05-06 DIAGNOSIS — R300 Dysuria: Secondary | ICD-10-CM | POA: Insufficient documentation

## 2020-05-06 DIAGNOSIS — Z23 Encounter for immunization: Secondary | ICD-10-CM | POA: Insufficient documentation

## 2020-05-06 DIAGNOSIS — Z1231 Encounter for screening mammogram for malignant neoplasm of breast: Secondary | ICD-10-CM | POA: Insufficient documentation

## 2020-05-17 ENCOUNTER — Telehealth: Payer: Self-pay

## 2020-05-17 NOTE — Telephone Encounter (Signed)
Lmom to confirm and screen for 05-19-20 ov. 

## 2020-05-19 ENCOUNTER — Ambulatory Visit: Payer: Medicare Other | Admitting: Nurse Practitioner

## 2020-06-29 DIAGNOSIS — M48061 Spinal stenosis, lumbar region without neurogenic claudication: Secondary | ICD-10-CM | POA: Diagnosis not present

## 2020-06-29 DIAGNOSIS — M069 Rheumatoid arthritis, unspecified: Secondary | ICD-10-CM | POA: Diagnosis not present

## 2020-06-29 DIAGNOSIS — M25819 Other specified joint disorders, unspecified shoulder: Secondary | ICD-10-CM | POA: Diagnosis not present

## 2020-06-29 DIAGNOSIS — M1991 Primary osteoarthritis, unspecified site: Secondary | ICD-10-CM | POA: Diagnosis not present

## 2020-07-14 DIAGNOSIS — Z20822 Contact with and (suspected) exposure to covid-19: Secondary | ICD-10-CM | POA: Diagnosis not present

## 2020-07-17 ENCOUNTER — Telehealth: Payer: Self-pay

## 2020-07-17 NOTE — Telephone Encounter (Signed)
She is going to have to be seen. At least a virtual visit. I haven't seen her since July.

## 2020-07-17 NOTE — Telephone Encounter (Signed)
Spoke with pt, informed her she would need to be seen before she got meds, transferred pt to schedule appointment.

## 2020-07-18 ENCOUNTER — Ambulatory Visit: Payer: Medicare Other | Admitting: Nurse Practitioner

## 2020-07-18 ENCOUNTER — Other Ambulatory Visit: Payer: Self-pay

## 2020-07-18 ENCOUNTER — Encounter: Payer: Self-pay | Admitting: Nurse Practitioner

## 2020-07-18 VITALS — BP 161/79 | HR 83 | Temp 97.5°F | Resp 16 | Ht 63.0 in | Wt 153.0 lb

## 2020-07-18 DIAGNOSIS — E538 Deficiency of other specified B group vitamins: Secondary | ICD-10-CM | POA: Diagnosis not present

## 2020-07-18 DIAGNOSIS — R5383 Other fatigue: Secondary | ICD-10-CM

## 2020-07-18 DIAGNOSIS — F321 Major depressive disorder, single episode, moderate: Secondary | ICD-10-CM

## 2020-07-18 DIAGNOSIS — F411 Generalized anxiety disorder: Secondary | ICD-10-CM | POA: Diagnosis not present

## 2020-07-18 DIAGNOSIS — F4321 Adjustment disorder with depressed mood: Secondary | ICD-10-CM | POA: Insufficient documentation

## 2020-07-18 DIAGNOSIS — F4320 Adjustment disorder, unspecified: Secondary | ICD-10-CM | POA: Insufficient documentation

## 2020-07-18 MED ORDER — DIAZEPAM 5 MG PO TABS: ORAL_TABLET | ORAL | 1 refills | Status: DC

## 2020-07-18 MED ORDER — CYANOCOBALAMIN 1000 MCG/ML IJ SOLN
1000.0000 ug | Freq: Once | INTRAMUSCULAR | Status: AC
  Administered 2020-07-18: 1000 ug via INTRAMUSCULAR

## 2020-07-18 MED ORDER — SERTRALINE HCL 25 MG PO TABS: 25.0000 mg | ORAL_TABLET | Freq: Every day | ORAL | 1 refills | Status: DC

## 2020-07-18 NOTE — Progress Notes (Signed)
[facility] [address] [place], [place] [address]  Internal MEDICINE  Office Visit Note  Patient Name: Morgan Abbott  [id number]  [id number]  Date of Service: 07/18/2020   Pt is here for a sick visit.  Chief Complaint  Patient presents with  . Acute Visit  . Hyperlipidemia  . Depression     The patient is here for acute visit. Her father passed away on January 06, 2023 in the hospital. He had COVID back in September and ever since, he had been suffering from dementia and delirium since his fight with COVID. On Friday, he began to have worsening mental status changes. He went downhill in the hospital and passed away on January 06, 2023. Today, they are having viewing today and funeral services tomorrow.        Current Medication:  Outpatient Encounter Medications as of 07/18/2020  Medication Sig  . betamethasone dipropionate 0.05 % cream Apply topically 2 (two) times daily.  . ergocalciferol (VITAMIN D2) 1.25 MG (50000 UT) capsule Take 1 capsule (50,000 Units total) by mouth once a week.  Marland Kitchen ibuprofen (ADVIL,MOTRIN) 800 MG tablet Take 800 mg by mouth 3 (three) times daily as needed.  Marland Kitchen omeprazole (PRILOSEC) 40 MG capsule Take 1 capsule (40 mg total) by mouth daily.  Marland Kitchen oxyCODONE-acetaminophen (PERCOCET) 10-325 MG tablet TAKE 1 TABLET BY MOUTH EVERY 4 TO 6 HOURS AS NEEDED *MAX OF 5 A DAY* *DNF UNTIL 10/10/17*  . [DISCONTINUED] DULoxetine (CYMBALTA) 20 MG capsule TAKE 1 CAPSULE BY MOUTH EVERY DAY  . diazepam (VALIUM) 5 MG tablet Take 1/2 to 1 table po TID prn anxiety  . sertraline (ZOLOFT) 25 MG tablet Take 1 tablet (25 mg total) by mouth at bedtime.  . [EXPIRED] cyanocobalamin ((VITAMIN B-12)) injection 1,000 mcg    No facility-administered encounter medications on file as of 07/18/2020.      Medical History: Past Medical History:  Diagnosis Date  . Allergic rhinitis   . Arthritis   . Bronchitis   . Hyperlipidemia   . Migraines     Today's Vitals   07/18/20 0838   BP: (!) 161/79  Pulse: 83  Resp: 16  Temp: (!) 97.5 F (36.4 C)  SpO2: 97%  Weight: 153 lb (69.4 kg)  Height: 5\' 3"  (1.6 m)   Body mass index is 27.1 kg/m.  Review of Systems  Constitutional: Positive for appetite change and fatigue. Negative for activity change, chills and unexpected weight change.  HENT: Negative for congestion, postnasal drip, rhinorrhea, sneezing and sore throat.   Respiratory: Negative for cough, chest tightness and shortness of breath.   Cardiovascular: Negative for chest pain and palpitations.  Gastrointestinal: Negative for abdominal pain, constipation, diarrhea, nausea and vomiting.  Musculoskeletal: Negative for arthralgias, back pain, joint swelling and neck pain.  Skin: Negative for rash.  Neurological: Positive for headaches. Negative for tremors and numbness.  Hematological: Negative for adenopathy. Does not bruise/bleed easily.  Psychiatric/Behavioral: Positive for dysphoric mood and sleep disturbance. Negative for behavioral problems (Depression) and suicidal ideas. The patient is nervous/anxious.        Patient's father passed away on January 06, 2023 evening. She is very upset.     Physical Exam Vitals and nursing note reviewed.  Constitutional:      General: She is not in acute distress.    Appearance: Normal appearance. She is well-developed. She is not diaphoretic.  HENT:     Head: Normocephalic and atraumatic.     Mouth/Throat:     Pharynx: No oropharyngeal exudate.  Eyes:     Pupils: Pupils are equal, round, and reactive to light.  Neck:     Thyroid: No thyromegaly.     Vascular: No JVD.     Trachea: No tracheal deviation.  Cardiovascular:     Rate and Rhythm: Normal rate and regular rhythm.     Heart sounds: Normal heart sounds. No murmur heard.  No friction rub. No gallop.   Pulmonary:     Effort: Pulmonary effort is normal. No respiratory distress.     Breath sounds: Normal breath sounds. No wheezing or rales.  Chest:     Chest  wall: No tenderness.  Abdominal:     Palpations: Abdomen is soft.  Musculoskeletal:        General: Normal range of motion.     Cervical back: Normal range of motion and neck supple.  Lymphadenopathy:     Cervical: No cervical adenopathy.  Skin:    General: Skin is warm and dry.  Neurological:     General: No focal deficit present.     Mental Status: She is alert and oriented to person, place, and time.     Cranial Nerves: No cranial nerve deficit.  Psychiatric:        Attention and Perception: Perception normal.        Mood and Affect: Mood is anxious and depressed. Affect is tearful.        Speech: Speech normal.        Behavior: Behavior normal. Behavior is cooperative.        Thought Content: Thought content normal.        Cognition and Memory: Cognition and memory normal.        Judgment: Judgment normal.    Assessment/Plan:   1. Other fatigue Likely related to depression/ and grief reaction after recent loss of her dad. Will monitor.  2. Grief reaction Short term prescription for valium 5mg . Patient may take 1/2 to 1 tablet po up to three times daily as needed for acute anxiety. Prescription for #15 tablets sent to pharmacy. Advised patient that she should not drive or work after taking this medication. She voiced understanding and agreement with these instructions.  - diazepam (VALIUM) 5 MG tablet; Take 1/2 to 1 table po TID prn anxiety  Dispense: 15 tablet; Refill: 1  3. Generalized anxiety disorder Short term prescription for valium 5mg . Patient may take 1/2 to 1 tablet po up to three times daily as needed for acute anxiety. Prescription for #15 tablets sent to pharmacy. Advised patient that she should not drive or work after taking this medication. She voiced understanding and agreement with these instructions.  - diazepam (VALIUM) 5 MG tablet; Take 1/2 to 1 table po TID prn anxiety  Dispense: 15 tablet; Refill: 1  4. Depression, major, single episode, moderate  (HCC) Trial of sertraline 25mg  daily.  - sertraline (ZOLOFT) 25 MG tablet; Take 1 tablet (25 mg total) by mouth at bedtime.  Dispense: 30 tablet; Refill: 1  5. B12 deficiency b12 injection administered today.  - cyanocobalamin ((VITAMIN B-12)) injection 1,000 mcg   General Counseling: Shirelle verbalizes understanding of the findings of todays visit and agrees with plan of treatment. I have discussed any further diagnostic evaluation that may be needed or ordered today. We also reviewed her medications today. she has been encouraged to call the office with any questions or concerns that should arise related to todays visit.    Counseling:   This patient was seen by  Rutha Melgoza FNP Collaboration with Dr Lyndon Code as a part of collaborative care agreement  Meds ordered this encounter  Medications  . sertraline (ZOLOFT) 25 MG tablet    Sig: Take 1 tablet (25 mg total) by mouth at bedtime.    Dispense:  30 tablet    Refill:  1    Order Specific Question:   Supervising Provider    Answer:   Lyndon Code [[id number]]  . diazepam (VALIUM) 5 MG tablet    Sig: Take 1/2 to 1 table po TID prn anxiety    Dispense:  15 tablet    Refill:  1    Order Specific Question:   Supervising Provider    Answer:   Lyndon Code [[id number]]  . cyanocobalamin ((VITAMIN B-12)) injection 1,000 mcg    Time spent: 30 Minutes

## 2020-08-09 ENCOUNTER — Other Ambulatory Visit: Payer: Self-pay | Admitting: Nurse Practitioner

## 2020-08-09 DIAGNOSIS — F321 Major depressive disorder, single episode, moderate: Secondary | ICD-10-CM

## 2020-08-17 ENCOUNTER — Other Ambulatory Visit: Payer: Self-pay

## 2020-08-17 ENCOUNTER — Ambulatory Visit: Payer: Medicare Other | Admitting: Nurse Practitioner

## 2020-08-17 ENCOUNTER — Encounter: Payer: Self-pay | Admitting: Nurse Practitioner

## 2020-08-17 VITALS — BP 128/70 | HR 71 | Temp 98.0°F | Resp 16 | Ht 63.0 in | Wt 153.4 lb

## 2020-08-17 DIAGNOSIS — Z0001 Encounter for general adult medical examination with abnormal findings: Secondary | ICD-10-CM

## 2020-08-17 DIAGNOSIS — F4321 Adjustment disorder with depressed mood: Secondary | ICD-10-CM

## 2020-08-17 DIAGNOSIS — F411 Generalized anxiety disorder: Secondary | ICD-10-CM | POA: Diagnosis not present

## 2020-08-17 DIAGNOSIS — J4520 Mild intermittent asthma, uncomplicated: Secondary | ICD-10-CM

## 2020-08-17 DIAGNOSIS — E538 Deficiency of other specified B group vitamins: Secondary | ICD-10-CM

## 2020-08-17 MED ORDER — CYANOCOBALAMIN 1000 MCG/ML IJ SOLN
1000.0000 ug | Freq: Once | INTRAMUSCULAR | Status: AC
  Administered 2020-08-17: 1000 ug via INTRAMUSCULAR

## 2020-08-17 MED ORDER — DIAZEPAM 5 MG PO TABS: ORAL_TABLET | ORAL | 1 refills | Status: DC

## 2020-08-17 NOTE — Progress Notes (Signed)
[facility] [address] [place], [place] [address]  Internal MEDICINE  Office Visit Note  Patient Name: Morgan Abbott  [id number]  [id number]  Date of Service: 09/03/2020    Pt is here for routine health maintenance examination   Chief Complaint  Patient presents with  . Medicare Wellness  . Hyperlipidemia  . Quality Metric Gaps    covid  . controlled substance form    reviewed with PT     The patient is here for health maintenance exam. She was seen, most recently, after the death of her father. She does continue to grieve this loss. She was started on sertraline 25mg  every day. Does take valium 5mg , 1/2 to 1 tablet at bedtime if needed for acute anxiety, making it difficult for her to sleep. She does plan to contact grief counselors through hospice.  -she had routine, fasting labs done and these have been reviewed with the patient.  -screening mammogram ordered in 03/2020. She still needs to have this scheduled.   Current Medication: Outpatient Encounter Medications as of 08/17/2020  Medication Sig  . betamethasone dipropionate 0.05 % cream Apply topically 2 (two) times daily.  . diazepam (VALIUM) 5 MG tablet Take 1/2 to 1 tablet QHS prn anxiety/insomnia  . ergocalciferol (VITAMIN D2) 1.25 MG (50000 UT) capsule Take 1 capsule (50,000 Units total) by mouth once a week.  04/2020 ibuprofen (ADVIL,MOTRIN) 800 MG tablet Take 800 mg by mouth 3 (three) times daily as needed.  14/10/2019 omeprazole (PRILOSEC) 40 MG capsule Take 1 capsule (40 mg total) by mouth daily.  Marland Kitchen oxyCODONE-acetaminophen (PERCOCET) 10-325 MG tablet TAKE 1 TABLET BY MOUTH EVERY 4 TO 6 HOURS AS NEEDED *MAX OF 5 A DAY* *DNF UNTIL 10/10/17*  . sertraline (ZOLOFT) 25 MG tablet TAKE 1 TABLET BY MOUTH EVERYDAY AT BEDTIME  . [DISCONTINUED] diazepam (VALIUM) 5 MG tablet Take 1/2 to 1 table po TID prn anxiety  . [EXPIRED] cyanocobalamin ((VITAMIN B-12)) injection 1,000 mcg    No facility-administered encounter  medications on file as of 08/17/2020.    Surgical History: Past Surgical History:  Procedure Laterality Date  . CHOLECYSTECTOMY  1996  . COLON RESECTION    . dilatation and curettage    . TONSILLECTOMY Bilateral     Medical History: Past Medical History:  Diagnosis Date  . Allergic rhinitis   . Arthritis   . Bronchitis   . Hyperlipidemia   . Migraines     Family History: History reviewed. No pertinent family history.    Review of Systems  Constitutional: Positive for fatigue. Negative for activity change, appetite change, chills and unexpected weight change.       Appetite improving  HENT: Negative for congestion, postnasal drip, rhinorrhea, sneezing and sore throat.   Respiratory: Negative for cough, chest tightness, shortness of breath and wheezing.   Cardiovascular: Negative for chest pain and palpitations.  Gastrointestinal: Negative for abdominal pain, constipation, diarrhea, nausea and vomiting.  Endocrine: Negative for cold intolerance, heat intolerance, polydipsia and polyuria.  Genitourinary: Negative for dysuria, frequency, hematuria and urgency.  Musculoskeletal: Negative for arthralgias, back pain, joint swelling and neck pain.  Skin: Negative for rash.  Allergic/Immunologic: Positive for environmental allergies.  Neurological: Positive for headaches. Negative for tremors and numbness.  Hematological: Negative for adenopathy. Does not bruise/bleed easily.  Psychiatric/Behavioral: Positive for dysphoric mood and sleep disturbance. Negative for behavioral problems (Depression) and suicidal ideas. The patient is nervous/anxious.        Continues to grieve the loss  of her father. dogn better, overall.     Today's Vitals   08/17/20 1138  BP: 128/70  Pulse: 71  Resp: 16  Temp: 98 F (36.7 C)  SpO2: 98%  Weight: 153 lb 6.4 oz (69.6 kg)  Height: 5\' 3"  (1.6 m)   Body mass index is 27.17 kg/m.  Physical Exam Vitals and nursing note reviewed.   Constitutional:      General: She is not in acute distress.    Appearance: Normal appearance. She is well-developed and well-nourished. She is not diaphoretic.  HENT:     Head: Normocephalic and atraumatic.     Nose: Nose normal.     Mouth/Throat:     Mouth: Oropharynx is clear and moist.     Pharynx: No oropharyngeal exudate.  Eyes:     Extraocular Movements: EOM normal.     Pupils: Pupils are equal, round, and reactive to light.  Neck:     Thyroid: No thyromegaly.     Vascular: No carotid bruit or JVD.     Trachea: No tracheal deviation.  Cardiovascular:     Rate and Rhythm: Normal rate and regular rhythm.     Pulses: Normal pulses.     Heart sounds: Normal heart sounds. No murmur heard. No friction rub. No gallop.   Pulmonary:     Effort: Pulmonary effort is normal. No respiratory distress.     Breath sounds: Normal breath sounds. No wheezing or rales.  Chest:     Chest wall: No tenderness.  Abdominal:     General: Bowel sounds are normal.     Palpations: Abdomen is soft.     Tenderness: There is no abdominal tenderness.  Musculoskeletal:        General: Normal range of motion.     Cervical back: Normal range of motion and neck supple.  Lymphadenopathy:     Cervical: No cervical adenopathy.  Skin:    General: Skin is warm and dry.     Capillary Refill: Capillary refill takes less than 2 seconds.  Neurological:     General: No focal deficit present.     Mental Status: She is alert and oriented to person, place, and time.     Cranial Nerves: No cranial nerve deficit.  Psychiatric:        Attention and Perception: Attention and perception normal.        Mood and Affect: Mood is anxious and depressed.        Speech: Speech normal.        Behavior: Behavior normal. Behavior is cooperative.        Thought Content: Thought content normal.        Cognition and Memory: Cognition and memory normal.        Judgment: Judgment normal.    Depression screen [facility] 2/9 08/17/2020  04/07/2020 12/16/2019 04/16/2019 02/11/2019  Decreased Interest 0 0 0 0 0  Down, Depressed, Hopeless 0 0 0 0 0  PHQ - 2 Score 0 0 0 0 0    Functional Status Survey: Is the patient deaf or have difficulty hearing?: No Does the patient have difficulty seeing, even when wearing glasses/contacts?: No Does the patient have difficulty concentrating, remembering, or making decisions?: No Does the patient have difficulty walking or climbing stairs?: Yes Does the patient have difficulty dressing or bathing?: No Does the patient have difficulty doing errands alone such as visiting a doctor's office or shopping?: No  MMSE - Mini Mental State Exam 08/17/2020  Orientation  to time 5  Orientation to Place 5  Registration 3  Attention/ Calculation 5  Recall 3  Language- name 2 objects 2  Language- repeat 1  Language- follow 3 step command 3  Language- read & follow direction 1  Write a sentence 1  Copy design 1  Total score 30    Fall Risk  08/17/2020 04/07/2020 12/16/2019 04/16/2019 02/11/2019  Falls in the past year? 0 0 0 0 0      Assessment/Plan: 1. Encounter for general adult medical examination with abnormal findings Annual health maintenance exam today.   2. Mild intermittent asthma without complication Well managed. contineu to use rescue inhaler and breathing treatments as needed and as previously prescribed.   3. B12 deficiency b12 injection administered in the office today.  - cyanocobalamin ((VITAMIN B-12)) injection 1,000 mcg  4. Grief reaction Continue sertraline 25mg  daily. May take diazepam 5mg , 1/2 to 1 tablet at bedtime as needed for acute anxiety leading to insomnia.  - diazepam (VALIUM) 5 MG tablet; Take 1/2 to 1 tablet QHS prn anxiety/insomnia  Dispense: 30 tablet; Refill: 1  5. Generalized anxiety disorder Continue sertraline 25mg  daily. May take diazepam 5mg , 1/2 to 1 tablet at bedtime as needed for acute anxiety leading to insomnia.  - diazepam (VALIUM) 5 MG tablet;  Take 1/2 to 1 tablet QHS prn anxiety/insomnia  Dispense: 30 tablet; Refill: 1  General Counseling: Sahory verbalizes understanding of the findings of todays visit and agrees with plan of treatment. I have discussed any further diagnostic evaluation that may be needed or ordered today. We also reviewed her medications today. she has been encouraged to call the office with any questions or concerns that should arise related to todays visit.    Counseling:  This patient was seen by FNP Collaboration with Dr as a part of collaborative care agreement  Meds ordered this encounter  Medications  . cyanocobalamin ((VITAMIN B-12)) injection 1,000 mcg  . diazepam (VALIUM) 5 MG tablet    Sig: Take 1/2 to 1 tablet QHS prn anxiety/insomnia    Dispense:  30 tablet    Refill:  1    Order Specific Question:   Supervising Provider    Answer:   [[id number]]    Total time spent: 45 Minutes  Time spent includes review of chart, medications, test results, and follow up plan with the patient.     , MD  Internal Medicine

## 2020-08-20 ENCOUNTER — Telehealth: Payer: Self-pay

## 2020-08-20 NOTE — Telephone Encounter (Signed)
Prior auth approved for DIAZEPAM 5 MG TABLETS on 08/19/2020 thru 08/19/2021.  Called pharmacy and informed them that PA was approved.  Called pt to let her know medication was approved.  LMOM

## 2020-08-31 DIAGNOSIS — M069 Rheumatoid arthritis, unspecified: Secondary | ICD-10-CM | POA: Diagnosis not present

## 2020-08-31 DIAGNOSIS — M25561 Pain in right knee: Secondary | ICD-10-CM | POA: Diagnosis not present

## 2020-08-31 DIAGNOSIS — M48061 Spinal stenosis, lumbar region without neurogenic claudication: Secondary | ICD-10-CM | POA: Diagnosis not present

## 2020-08-31 DIAGNOSIS — M25562 Pain in left knee: Secondary | ICD-10-CM | POA: Diagnosis not present

## 2020-09-03 DIAGNOSIS — Z0001 Encounter for general adult medical examination with abnormal findings: Secondary | ICD-10-CM | POA: Insufficient documentation

## 2020-09-11 DIAGNOSIS — Z1152 Encounter for screening for COVID-19: Secondary | ICD-10-CM | POA: Diagnosis not present

## 2020-09-11 DIAGNOSIS — Z03818 Encounter for observation for suspected exposure to other biological agents ruled out: Secondary | ICD-10-CM | POA: Diagnosis not present

## 2020-09-11 DIAGNOSIS — Z20822 Contact with and (suspected) exposure to covid-19: Secondary | ICD-10-CM | POA: Diagnosis not present

## 2020-10-17 ENCOUNTER — Ambulatory Visit (INDEPENDENT_AMBULATORY_CARE_PROVIDER_SITE_OTHER): Payer: Medicare Other | Admitting: Internal Medicine

## 2020-10-17 ENCOUNTER — Encounter: Payer: Self-pay | Admitting: Internal Medicine

## 2020-10-17 VITALS — BP 128/84 | HR 86 | Temp 97.9°F | Resp 16 | Ht 64.0 in | Wt 154.8 lb

## 2020-10-17 DIAGNOSIS — F411 Generalized anxiety disorder: Secondary | ICD-10-CM

## 2020-10-17 DIAGNOSIS — E538 Deficiency of other specified B group vitamins: Secondary | ICD-10-CM

## 2020-10-17 DIAGNOSIS — F321 Major depressive disorder, single episode, moderate: Secondary | ICD-10-CM | POA: Diagnosis not present

## 2020-10-17 DIAGNOSIS — G478 Other sleep disorders: Secondary | ICD-10-CM

## 2020-10-17 DIAGNOSIS — G4700 Insomnia, unspecified: Secondary | ICD-10-CM

## 2020-10-17 MED ORDER — MIRTAZAPINE 15 MG PO TABS: 15.0000 mg | ORAL_TABLET | Freq: Every day | ORAL | 1 refills | Status: DC

## 2020-10-17 MED ORDER — CYANOCOBALAMIN 1000 MCG/ML IJ SOLN
1000.0000 ug | Freq: Once | INTRAMUSCULAR | Status: AC
  Administered 2020-10-17: 1000 ug via INTRAMUSCULAR

## 2020-10-17 MED ORDER — ALPRAZOLAM 0.25 MG PO TABS: ORAL_TABLET | ORAL | 1 refills | Status: DC

## 2020-10-17 MED ORDER — SERTRALINE HCL 50 MG PO TABS: ORAL_TABLET | ORAL | 3 refills | Status: DC

## 2020-10-17 NOTE — Progress Notes (Signed)
[facility] [address] [place], [place] [address]  Internal MEDICINE  Office Visit Note  Patient Name: Morgan Abbott  [id number]  [id number]  Date of Service: 10/25/2020  Chief Complaint  Patient presents with  . medication refills  . Hyperlipidemia    HPI Pt is here for routine follow up for med refills. She is emotional and crying de to her father's dad. She has not been sleeping. She has been on low dose Zoloft and takes Xanax prn. Denies suicidal ideation, she feels down and sad.     Current Medication: Outpatient Encounter Medications as of 10/17/2020  Medication Sig  . ALPRAZolam (XANAX) 0.25 MG tablet Take one tab at night for anxiety  . betamethasone dipropionate 0.05 % cream Apply topically 2 (two) times daily.  . ergocalciferol (VITAMIN D2) 1.25 MG (50000 UT) capsule Take 1 capsule (50,000 Units total) by mouth once a week.  Marland Kitchen ibuprofen (ADVIL,MOTRIN) 800 MG tablet Take 800 mg by mouth 3 (three) times daily as needed.  . mirtazapine (REMERON) 15 MG tablet Take 1 tablet (15 mg total) by mouth at bedtime.  Marland Kitchen omeprazole (PRILOSEC) 40 MG capsule Take 1 capsule (40 mg total) by mouth daily.  Marland Kitchen oxyCODONE-acetaminophen (PERCOCET) 10-325 MG tablet TAKE 1 TABLET BY MOUTH EVERY 4 TO 6 HOURS AS NEEDED *MAX OF 5 A DAY* *DNF UNTIL 10/10/17*  . [DISCONTINUED] diazepam (VALIUM) 5 MG tablet Take 1/2 to 1 tablet QHS prn anxiety/insomnia  . [DISCONTINUED] sertraline (ZOLOFT) 25 MG tablet TAKE 1 TABLET BY MOUTH EVERYDAY AT BEDTIME  . sertraline (ZOLOFT) 50 MG tablet Take one tab po qd for depression for one week and then take one half every day  . [EXPIRED] cyanocobalamin ((VITAMIN B-12)) injection 1,000 mcg    No facility-administered encounter medications on file as of 10/17/2020.    Surgical History: Past Surgical History:  Procedure Laterality Date  . CHOLECYSTECTOMY  1996  . COLON RESECTION    . dilatation and curettage    . TONSILLECTOMY Bilateral     Medical  History: Past Medical History:  Diagnosis Date  . Allergic rhinitis   . Arthritis   . Bronchitis   . Hyperlipidemia   . Migraines     Family History: Family History  Problem Relation Age of Onset  . Hypertension Mother   . Bladder Cancer Father        63 yrs old when passed away    Social History   Socioeconomic History  . Marital status: Married    Spouse name: Not on file  . Number of children: Not on file  . Years of education: Not on file  . Highest education level: Not on file  Occupational History  . Not on file  Tobacco Use  . Smoking status: Never Smoker  . Smokeless tobacco: Never Used  Substance and Sexual Activity  . Alcohol use: No  . Drug use: No  . Sexual activity: Not on file  Other Topics Concern  . Not on file  Social History Narrative  . Not on file   Social Determinants of Health   Financial Resource Strain: Not on file  Food Insecurity: Not on file  Transportation Needs: Not on file  Physical Activity: Not on file  Stress: Not on file  Social Connections: Not on file  Intimate Partner Violence: Not on file      Review of Systems  Constitutional: Negative for chills, diaphoresis and fatigue.  HENT: Negative for ear pain, postnasal drip and  sinus pressure.   Eyes: Negative for photophobia, discharge, redness, itching and visual disturbance.  Respiratory: Negative for cough, shortness of breath and wheezing.   Cardiovascular: Negative for chest pain, palpitations and leg swelling.  Gastrointestinal: Negative for abdominal pain, constipation, diarrhea, nausea and vomiting.  Genitourinary: Negative for dysuria and flank pain.  Musculoskeletal: Negative for arthralgias, back pain, gait problem and neck pain.  Skin: Negative for color change.  Allergic/Immunologic: Negative for environmental allergies and food allergies.  Neurological: Negative for dizziness and headaches.  Hematological: Does not bruise/bleed easily.   Psychiatric/Behavioral: Negative for agitation, behavioral problems (depression) and hallucinations.    Vital Signs: BP 128/84   Pulse 86   Temp 97.9 F (36.6 C)   Resp 16   Ht 5\' 4"  (1.626 m)   Wt 154 lb 12.8 oz (70.2 kg)   SpO2 97%   BMI 26.57 kg/m    Physical Exam Constitutional:      General: She is not in acute distress.    Appearance: She is well-developed. She is not diaphoretic.  HENT:     Head: Normocephalic and atraumatic.     Mouth/Throat:     Pharynx: No oropharyngeal exudate.  Eyes:     Pupils: Pupils are equal, round, and reactive to light.  Neck:     Thyroid: No thyromegaly.     Vascular: No JVD.     Trachea: No tracheal deviation.  Cardiovascular:     Rate and Rhythm: Normal rate and regular rhythm.     Heart sounds: Normal heart sounds. No murmur heard. No friction rub. No gallop.   Pulmonary:     Effort: Pulmonary effort is normal. No respiratory distress.     Breath sounds: No wheezing or rales.  Chest:     Chest wall: No tenderness.  Abdominal:     General: Bowel sounds are normal.     Palpations: Abdomen is soft.  Musculoskeletal:        General: Normal range of motion.     Cervical back: Normal range of motion and neck supple.  Lymphadenopathy:     Cervical: No cervical adenopathy.  Skin:    General: Skin is warm and dry.  Neurological:     Mental Status: She is alert and oriented to person, place, and time.     Cranial Nerves: No cranial nerve deficit.  Psychiatric:        Behavior: Behavior normal.        Thought Content: Thought content normal.        Judgment: Judgment normal.        Assessment/Plan: 1. B12 deficiency Pt is on B12 replacement therapy  - cyanocobalamin ((VITAMIN B-12)) injection 1,000 mcg  2. Depression, major, single episode, moderate (HCC) Increase Zoloft and titarte as indicated  - sertraline (ZOLOFT) 50 MG tablet; Take one tab po qd for depression for one week and then take one half every day   Dispense: 60 tablet; Refill: 3  3. Generalized anxiety disorder Add Mirtazapine  - mirtazapine (REMERON) 15 MG tablet; Take 1 tablet (15 mg total) by mouth at bedtime.  Dispense: 30 tablet; Refill: 1  4. Insomnia disorder, with other sleep disorder, recurrent Will continue to taper  - ALPRAZolam (XANAX) 0.25 MG tablet; Take one tab at night for anxiety  Dispense: 30 tablet; Refill: 1  General Counseling: Gema verbalizes understanding of the findings of todays visit and agrees with plan of treatment. I have discussed any further diagnostic evaluation that may be needed  or ordered today. We also reviewed her medications today. she has been encouraged to call the office with any questions or concerns that should arise related to todays visit.  Will update her PHM mammogram, BMD and colonoscopy on next visit     Meds ordered this encounter  Medications  . sertraline (ZOLOFT) 50 MG tablet    Sig: Take one tab po qd for depression for one week and then take one half every day    Dispense:  60 tablet    Refill:  3  . ALPRAZolam (XANAX) 0.25 MG tablet    Sig: Take one tab at night for anxiety    Dispense:  30 tablet    Refill:  1  . mirtazapine (REMERON) 15 MG tablet    Sig: Take 1 tablet (15 mg total) by mouth at bedtime.    Dispense:  30 tablet    Refill:  1  . cyanocobalamin ((VITAMIN B-12)) injection 1,000 mcg    Total time spent:30 Minutes Time spent includes review of chart, medications, test results, and follow up plan with the patient.   [place] Controlled Substance Database was reviewed by me.   Dr Lyndon Code Internal medicine

## 2020-10-25 ENCOUNTER — Telehealth: Payer: Self-pay

## 2020-10-25 NOTE — Telephone Encounter (Signed)
-----   Message from Lyndon Code, MD sent at 10/25/2020  9:01 AM EST ----- I dont see a recent mammogram on her, can u check with pt pls

## 2020-11-07 ENCOUNTER — Telehealth: Payer: Self-pay

## 2020-11-07 NOTE — Telephone Encounter (Signed)
LMOM with [facility] for them to fax over pts mammogram or call us if they needed more info

## 2020-11-08 ENCOUNTER — Other Ambulatory Visit: Payer: Self-pay | Admitting: Internal Medicine

## 2020-11-08 DIAGNOSIS — F411 Generalized anxiety disorder: Secondary | ICD-10-CM

## 2020-11-16 ENCOUNTER — Encounter: Payer: Self-pay | Admitting: Physician Assistant

## 2020-11-16 ENCOUNTER — Ambulatory Visit (INDEPENDENT_AMBULATORY_CARE_PROVIDER_SITE_OTHER): Payer: Medicare Other | Admitting: Physician Assistant

## 2020-11-16 DIAGNOSIS — E559 Vitamin D deficiency, unspecified: Secondary | ICD-10-CM

## 2020-11-16 DIAGNOSIS — E538 Deficiency of other specified B group vitamins: Secondary | ICD-10-CM

## 2020-11-16 DIAGNOSIS — M48061 Spinal stenosis, lumbar region without neurogenic claudication: Secondary | ICD-10-CM | POA: Diagnosis not present

## 2020-11-16 DIAGNOSIS — M25562 Pain in left knee: Secondary | ICD-10-CM | POA: Diagnosis not present

## 2020-11-16 DIAGNOSIS — G4700 Insomnia, unspecified: Secondary | ICD-10-CM | POA: Diagnosis not present

## 2020-11-16 DIAGNOSIS — F411 Generalized anxiety disorder: Secondary | ICD-10-CM

## 2020-11-16 DIAGNOSIS — F321 Major depressive disorder, single episode, moderate: Secondary | ICD-10-CM

## 2020-11-16 DIAGNOSIS — G478 Other sleep disorders: Secondary | ICD-10-CM

## 2020-11-16 DIAGNOSIS — M069 Rheumatoid arthritis, unspecified: Secondary | ICD-10-CM | POA: Diagnosis not present

## 2020-11-16 DIAGNOSIS — M25561 Pain in right knee: Secondary | ICD-10-CM | POA: Diagnosis not present

## 2020-11-16 MED ORDER — BETAMETHASONE DIPROPIONATE 0.05 % EX CREA: TOPICAL_CREAM | Freq: Two times a day (BID) | CUTANEOUS | 2 refills | Status: DC

## 2020-11-16 MED ORDER — ERGOCALCIFEROL 1.25 MG (50000 UT) PO CAPS
50000.0000 [IU] | ORAL_CAPSULE | ORAL | 3 refills | Status: DC
Start: 2020-11-16 — End: 2021-02-19

## 2020-11-16 NOTE — Progress Notes (Signed)
[facility] [address] [place], [place] [address]  Internal MEDICINE  Office Visit Note  Patient Name: Morgan Abbott  [id number]  [id number]  Date of Service: 11/17/2020  Chief Complaint  Patient presents with  . Depression  . Anxiety    From losing her father     HPI Pt presents today for 1 month f/u for anxiety and depression. Pt lost her father and has been having difficulty since that loss. -She reports that the Xanax does not help her sleep. After she takes it, it takes 3 hours for her to fall asleep and then she wakes back up. She has not taken the mirtazapine. She didn't realize she needed to be taking this nightly and was instead taking the xanax nightly. Educated that she should take the Mirtazapine each night and only use xanax as needed. Daytime anxiety and depression is ok. She has increased zoloft to 75mg  (1.5 tabs total). She is not interested in any grief counseling. She denies any SI.   Current Medication: Outpatient Encounter Medications as of 11/16/2020  Medication Sig  . ALPRAZolam (XANAX) 0.25 MG tablet Take one tab at night for anxiety  . ibuprofen (ADVIL,MOTRIN) 800 MG tablet Take 800 mg by mouth 3 (three) times daily as needed.  . mirtazapine (REMERON) 15 MG tablet Take 1 tablet (15 mg total) by mouth at bedtime.  01/16/2021 omeprazole (PRILOSEC) 40 MG capsule Take 1 capsule (40 mg total) by mouth daily.  Marland Kitchen oxyCODONE-acetaminophen (PERCOCET) 10-325 MG tablet TAKE 1 TABLET BY MOUTH EVERY 4 TO 6 HOURS AS NEEDED *MAX OF 5 A DAY* *DNF UNTIL 10/10/17*  . sertraline (ZOLOFT) 50 MG tablet Take one tab po qd for depression for one week and then take one half every day  . [DISCONTINUED] betamethasone dipropionate 0.05 % cream Apply topically 2 (two) times daily.  . [DISCONTINUED] ergocalciferol (VITAMIN D2) 1.25 MG (50000 UT) capsule Take 1 capsule (50,000 Units total) by mouth once a week.  . betamethasone dipropionate 0.05 % cream Apply topically 2 (two) times daily.   . ergocalciferol (VITAMIN D2) 1.25 MG (50000 UT) capsule Take 1 capsule (50,000 Units total) by mouth once a week.   No facility-administered encounter medications on file as of 11/16/2020.    Surgical History: Past Surgical History:  Procedure Laterality Date  . CHOLECYSTECTOMY  1996  . COLON RESECTION    . dilatation and curettage    . TONSILLECTOMY Bilateral     Medical History: Past Medical History:  Diagnosis Date  . Allergic rhinitis   . Arthritis   . Bronchitis   . Hyperlipidemia   . Migraines     Family History: Family History  Problem Relation Age of Onset  . Hypertension Mother   . Bladder Cancer Father        32 yrs old when passed away    Social History   Socioeconomic History  . Marital status: Married    Spouse name: Not on file  . Number of children: Not on file  . Years of education: Not on file  . Highest education level: Not on file  Occupational History  . Not on file  Tobacco Use  . Smoking status: Never Smoker  . Smokeless tobacco: Never Used  Substance and Sexual Activity  . Alcohol use: No  . Drug use: No  . Sexual activity: Not on file  Other Topics Concern  . Not on file  Social History Narrative  . Not on file   Social  Determinants of Health   Financial Resource Strain: Not on file  Food Insecurity: Not on file  Transportation Needs: Not on file  Physical Activity: Not on file  Stress: Not on file  Social Connections: Not on file  Intimate Partner Violence: Not on file      Review of Systems  Constitutional: Negative for chills, fatigue and unexpected weight change.  HENT: Negative for congestion, postnasal drip, rhinorrhea, sneezing and sore throat.   Eyes: Negative for redness.  Respiratory: Negative for cough, chest tightness and shortness of breath.   Cardiovascular: Negative for chest pain and palpitations.  Gastrointestinal: Negative for abdominal pain, constipation, diarrhea, nausea and vomiting.   Genitourinary: Negative for dysuria and frequency.  Musculoskeletal: Positive for arthralgias. Negative for back pain, joint swelling and neck pain.  Skin: Negative for rash.  Neurological: Negative.  Negative for tremors and numbness.  Hematological: Negative for adenopathy. Does not bruise/bleed easily.  Psychiatric/Behavioral: Positive for behavioral problems (Depression) and sleep disturbance. Negative for self-injury and suicidal ideas. The patient is nervous/anxious.     Vital Signs: BP 120/76   Pulse 90   Temp 98.4 F (36.9 C)   Resp 16   Ht 5\' 3"  (1.6 m)   Wt 160 lb 3.2 oz (72.7 kg)   SpO2 95%   BMI 28.38 kg/m    Physical Exam Vitals and nursing note reviewed.  Constitutional:      General: She is not in acute distress.    Appearance: She is well-developed. She is not diaphoretic.  HENT:     Head: Normocephalic and atraumatic.     Mouth/Throat:     Pharynx: No oropharyngeal exudate.  Eyes:     Pupils: Pupils are equal, round, and reactive to light.  Neck:     Thyroid: No thyromegaly.     Vascular: No JVD.     Trachea: No tracheal deviation.  Cardiovascular:     Rate and Rhythm: Normal rate and regular rhythm.     Heart sounds: Normal heart sounds. No murmur heard. No friction rub. No gallop.   Pulmonary:     Effort: Pulmonary effort is normal. No respiratory distress.     Breath sounds: No wheezing or rales.  Chest:     Chest wall: No tenderness.  Abdominal:     General: Bowel sounds are normal.     Palpations: Abdomen is soft.  Musculoskeletal:        General: Normal range of motion.     Cervical back: Normal range of motion and neck supple.  Lymphadenopathy:     Cervical: No cervical adenopathy.  Skin:    General: Skin is warm and dry.  Neurological:     Mental Status: She is alert and oriented to person, place, and time.     Cranial Nerves: No cranial nerve deficit.  Psychiatric:        Behavior: Behavior normal.        Thought Content: Thought  content normal.        Judgment: Judgment normal.     Comments: Depressed mood        Assessment/Plan: 1. Insomnia disorder, with other sleep disorder, recurrent Educated pt that she should be taking Mirtazapine nightly with xanax only as needed. We will continue to monitor.  2. Generalized anxiety disorder Start Mirtazapine nightly and utilize xanax only as needed for sleep.  3. Depression, major, single episode, moderate (HCC) Continue Zoloft 75mg  daily.  4. B12 deficiency Pt will need to have B12  lab drawn to check levels prior to continuing with injections. - B12 and Folate Panel  5. Vitamin D deficiency Will check vitamin D level, though likely still low since pt was unable to refill her supplement. - ergocalciferol (VITAMIN D2) 1.25 MG (50000 UT) capsule; Take 1 capsule (50,000 Units total) by mouth once a week.  Dispense: 4 capsule; Refill: 3 - Vitamin D (25 hydroxy)  General Counseling: Amandalynn verbalizes understanding of the findings of todays visit and agrees with plan of treatment. I have discussed any further diagnostic evaluation that may be needed or ordered today. We also reviewed her medications today. she has been encouraged to call the office with any questions or concerns that should arise related to todays visit.    Orders Placed This Encounter  Procedures  . B12 and Folate Panel  . Vitamin D (25 hydroxy)    Meds ordered this encounter  Medications  . ergocalciferol (VITAMIN D2) 1.25 MG (50000 UT) capsule    Sig: Take 1 capsule (50,000 Units total) by mouth once a week.    Dispense:  4 capsule    Refill:  3  . betamethasone dipropionate 0.05 % cream    Sig: Apply topically 2 (two) times daily.    Dispense:  30 g    Refill:  2    This patient was seen by Lynn Ito, PA-C in collaboration with Dr. Beverely Risen as a part of collaborative care agreement.   Total time spent:30 Minutes Time spent includes review of chart, medications, test  results, and follow up plan with the patient.      Dr Lyndon Code Internal medicine

## 2020-11-27 ENCOUNTER — Telehealth: Payer: Self-pay

## 2020-11-27 NOTE — Telephone Encounter (Signed)
Spoke to [facility], they will fax over mammogram from 2015, [facility] for pt to call if she had another mammogram done more recently than 2015

## 2020-12-06 ENCOUNTER — Telehealth: Payer: Self-pay | Admitting: Internal Medicine

## 2020-12-06 NOTE — Progress Notes (Signed)
  Chronic Care Management   Outreach Note  12/06/2020 Name: Morgan Abbott MRN: [id number] DOB: [birth year]  Referred by: Lyndon Code, MD Reason for referral : No chief complaint on file.   An unsuccessful telephone outreach was attempted today. The patient was referred to the pharmacist for assistance with care management and care coordination.   Follow Up Plan:   Carley Perdue [employer] Scheduler

## 2020-12-08 ENCOUNTER — Ambulatory Visit: Payer: Medicare Other | Admitting: Hospice and Palliative Medicine

## 2020-12-08 ENCOUNTER — Telehealth: Payer: Self-pay | Admitting: Internal Medicine

## 2020-12-08 NOTE — Progress Notes (Signed)
  Chronic Care Management   Note  12/08/2020 Name: Morgan Abbott MRN: [id number] DOB: 31-Mar-1946  Morgan Abbott is a 75 y.o. year old female who is a primary care patient of Lyndon Code, MD. I reached out to Lalla Brothers by phone today in response to a referral sent by [name]'s PCP, Lyndon Code, MD.   [name] was given information about Chronic Care Management services today including:  1. CCM service includes personalized support from designated clinical staff supervised by her physician, including individualized plan of care and coordination with other care providers 2. 24/7 contact phone numbers for assistance for urgent and routine care needs. 3. Service will only be billed when office clinical staff spend 20 minutes or more in a month to coordinate care. 4. Only one practitioner may furnish and bill the service in a calendar month. 5. The patient may stop CCM services at any time (effective at the end of the month) by phone call to the office staff.   Patient did not agree to enrollment in care management services and does not wish to consider at this time.  Follow up plan:   Carley Perdue [employer] Scheduler

## 2020-12-11 ENCOUNTER — Ambulatory Visit: Payer: Self-pay | Admitting: Hospice and Palliative Medicine

## 2020-12-11 ENCOUNTER — Other Ambulatory Visit: Payer: Self-pay | Admitting: Internal Medicine

## 2020-12-11 DIAGNOSIS — F321 Major depressive disorder, single episode, moderate: Secondary | ICD-10-CM

## 2020-12-12 ENCOUNTER — Other Ambulatory Visit: Payer: Self-pay

## 2020-12-12 ENCOUNTER — Ambulatory Visit: Payer: Medicare Other | Admitting: Physician Assistant

## 2020-12-12 ENCOUNTER — Encounter: Payer: Self-pay | Admitting: Physician Assistant

## 2020-12-12 DIAGNOSIS — E559 Vitamin D deficiency, unspecified: Secondary | ICD-10-CM | POA: Diagnosis not present

## 2020-12-12 DIAGNOSIS — F321 Major depressive disorder, single episode, moderate: Secondary | ICD-10-CM

## 2020-12-12 DIAGNOSIS — F411 Generalized anxiety disorder: Secondary | ICD-10-CM

## 2020-12-12 DIAGNOSIS — G4700 Insomnia, unspecified: Secondary | ICD-10-CM

## 2020-12-12 DIAGNOSIS — G478 Other sleep disorders: Secondary | ICD-10-CM | POA: Diagnosis not present

## 2020-12-12 DIAGNOSIS — E538 Deficiency of other specified B group vitamins: Secondary | ICD-10-CM | POA: Diagnosis not present

## 2020-12-12 MED ORDER — DIAZEPAM 2 MG PO TABS: ORAL_TABLET | ORAL | 0 refills | Status: DC

## 2020-12-12 MED ORDER — SERTRALINE HCL 100 MG PO TABS: 100.0000 mg | ORAL_TABLET | Freq: Every day | ORAL | 2 refills | Status: DC

## 2020-12-12 NOTE — Progress Notes (Signed)
[facility] [address] [place], [place] [address]  Internal MEDICINE  Office Visit Note  Patient Name: Morgan Abbott  [id number]  [id number]  Date of Service: 12/12/2020  Chief Complaint  Patient presents with  . Follow-up    Refill request for sleep and anxiety  . Hyperlipidemia  . Hypertension    HPI Pt is here for f/u on her anxiety and depression. -She is taking the zoloft 75mg . Previously took valium and tried xanax as alternative, but it has not been effective. -She has not been eating much because fear over choking since her father choked to death. -Refused mirtazapine because worried after reading up on it. Tried trazodone in past that didn't work. She is not sleeping well still. -Discussed that we could do either valium or ambien to help with her anxiety and sleeping, but not both together at this time. Pt states she would like to go back to valium since she has some daytime anxiety too. Discussed increasing her zoloft to help with this as well.  Current Medication: Outpatient Encounter Medications as of 12/12/2020  Medication Sig  . betamethasone dipropionate 0.05 % cream Apply topically 2 (two) times daily.  . diazepam (VALIUM) 2 MG tablet Take 0.5-1 tablets by mouth daily as needed for anxiety  . ergocalciferol (VITAMIN D2) 1.25 MG (50000 UT) capsule Take 1 capsule (50,000 Units total) by mouth once a week.  12/14/2020 ibuprofen (ADVIL,MOTRIN) 800 MG tablet Take 800 mg by mouth 3 (three) times daily as needed.  Marland Kitchen omeprazole (PRILOSEC) 40 MG capsule Take 1 capsule (40 mg total) by mouth daily.  Marland Kitchen oxyCODONE-acetaminophen (PERCOCET) 10-325 MG tablet TAKE 1 TABLET BY MOUTH EVERY 4 TO 6 HOURS AS NEEDED *MAX OF 5 A DAY* *DNF UNTIL 10/10/17*  . sertraline (ZOLOFT) 100 MG tablet Take 1 tablet (100 mg total) by mouth daily.  . [DISCONTINUED] mirtazapine (REMERON) 15 MG tablet Take 1 tablet (15 mg total) by mouth at bedtime.  . [DISCONTINUED] sertraline (ZOLOFT) 50 MG tablet  TAKE 1 TAB FOR DEPRESSION FOR 1 WEEK AND THEN TAKE 1/2 EVERY DAY  . [DISCONTINUED] ALPRAZolam (XANAX) 0.25 MG tablet Take one tab at night for anxiety   No facility-administered encounter medications on file as of 12/12/2020.    Surgical History: Past Surgical History:  Procedure Laterality Date  . CHOLECYSTECTOMY  1996  . COLON RESECTION    . dilatation and curettage    . TONSILLECTOMY Bilateral     Medical History: Past Medical History:  Diagnosis Date  . Allergic rhinitis   . Arthritis   . Bronchitis   . Hyperlipidemia   . Migraines     Family History: Family History  Problem Relation Age of Onset  . Hypertension Mother   . Bladder Cancer Father        42 yrs old when passed away    Social History   Socioeconomic History  . Marital status: Married    Spouse name: Not on file  . Number of children: Not on file  . Years of education: Not on file  . Highest education level: Not on file  Occupational History  . Not on file  Tobacco Use  . Smoking status: Never Smoker  . Smokeless tobacco: Never Used  Substance and Sexual Activity  . Alcohol use: No  . Drug use: No  . Sexual activity: Not on file  Other Topics Concern  . Not on file  Social History Narrative  . Not on file  Social Determinants of Health   Financial Resource Strain: Not on file  Food Insecurity: Not on file  Transportation Needs: Not on file  Physical Activity: Not on file  Stress: Not on file  Social Connections: Not on file  Intimate Partner Violence: Not on file      Review of Systems  Constitutional: Negative for chills, fatigue and unexpected weight change.  HENT: Negative for congestion, postnasal drip, rhinorrhea, sneezing and sore throat.   Eyes: Negative for redness.  Respiratory: Negative for cough, chest tightness and shortness of breath.   Cardiovascular: Negative for chest pain and palpitations.  Gastrointestinal: Negative for abdominal pain, constipation, diarrhea,  nausea and vomiting.  Genitourinary: Negative for dysuria and frequency.  Musculoskeletal: Negative for arthralgias, back pain, joint swelling and neck pain.  Skin: Negative for rash.  Neurological: Negative.  Negative for tremors, numbness and headaches.  Hematological: Negative for adenopathy. Does not bruise/bleed easily.  Psychiatric/Behavioral: Positive for behavioral problems (Depression) and sleep disturbance. Negative for self-injury and suicidal ideas. The patient is nervous/anxious.     Vital Signs: BP 130/76   Pulse 73   Temp 98.1 F (36.7 C)   Resp 16   Ht 5\' 3"  (1.6 m)   Wt 156 lb (70.8 kg)   SpO2 99%   BMI 27.63 kg/m    Physical Exam Constitutional:      General: She is not in acute distress.    Appearance: She is well-developed. She is not diaphoretic.  HENT:     Head: Normocephalic and atraumatic.     Mouth/Throat:     Pharynx: No oropharyngeal exudate.  Eyes:     Pupils: Pupils are equal, round, and reactive to light.  Neck:     Thyroid: No thyromegaly.     Vascular: No JVD.     Trachea: No tracheal deviation.  Cardiovascular:     Rate and Rhythm: Normal rate and regular rhythm.     Heart sounds: Normal heart sounds. No murmur heard. No friction rub. No gallop.   Pulmonary:     Effort: Pulmonary effort is normal. No respiratory distress.     Breath sounds: No wheezing or rales.  Chest:     Chest wall: No tenderness.  Abdominal:     General: Bowel sounds are normal.     Palpations: Abdomen is soft.  Musculoskeletal:        General: Normal range of motion.     Cervical back: Normal range of motion and neck supple.  Lymphadenopathy:     Cervical: No cervical adenopathy.  Skin:    General: Skin is warm and dry.  Neurological:     Mental Status: She is alert and oriented to person, place, and time.     Cranial Nerves: No cranial nerve deficit.  Psychiatric:        Thought Content: Thought content normal.        Judgment: Judgment normal.      Comments: Pt is anxious and tearful in office        Assessment/Plan: 1. Generalized anxiety disorder Pt will start back on valium, may take .5-1 tab per day as needed for acute anxiety. Discussed speaking with counselor and pt will call hospice help line. - diazepam (VALIUM) 2 MG tablet; Take 0.5-1 tablets by mouth daily as needed for anxiety  Dispense: 30 tablet; Refill: 0  2. Depression, major, single episode, moderate (HCC) Will increase zoloft to 100mg  daily, may need to increase to 150mg  in future. Discussed speaking  with counselor and pt will call hospice help line. Educated to make sure she continue to eat. - sertraline (ZOLOFT) 100 MG tablet; Take 1 tablet (100 mg total) by mouth daily.  Dispense: 30 tablet; Refill: 2  3. Insomnia disorder, with other sleep disorder, recurrent May take .5-1 tab of valium to help with anxiety at night. Discussed speaking with counselor and pt will call hospice help line.   General Counseling: Morgan Abbott understanding of the findings of todays visit and agrees with plan of treatment. I have discussed any further diagnostic evaluation that may be needed or ordered today. We also reviewed her medications today. she has been encouraged to call the office with any questions or concerns that should arise related to todays visit.    No orders of the defined types were placed in this encounter.   Meds ordered this encounter  Medications  . diazepam (VALIUM) 2 MG tablet    Sig: Take 0.5-1 tablets by mouth daily as needed for anxiety    Dispense:  30 tablet    Refill:  0  . sertraline (ZOLOFT) 100 MG tablet    Sig: Take 1 tablet (100 mg total) by mouth daily.    Dispense:  30 tablet    Refill:  2    This patient was seen by Lynn Ito, PA-C in collaboration with Dr. Beverely Risen as a part of collaborative care agreement.   Total time spent:30 Minutes Time spent includes review of chart, medications, test results, and follow up plan  with the patient.      Dr Lyndon Code Internal medicine

## 2020-12-13 ENCOUNTER — Telehealth: Payer: Self-pay

## 2020-12-13 LAB — VITAMIN D 25 HYDROXY (VIT D DEFICIENCY, FRACTURES): Vit D, 25-Hydroxy: 40.8 ng/mL (ref 30.0–100.0)

## 2020-12-13 LAB — B12 AND FOLATE PANEL
Folate: 4.8 ng/mL (ref >3.0)
Vitamin B-12: 537 pg/mL (ref 232–1245)

## 2020-12-13 NOTE — Telephone Encounter (Signed)
Pt called that valium 2 mg not help her ar all she said its hard time sleeping as per dr Welton Flakes advised her to take 2 tab at bedtime for valium 2 mg and call us back if its helping also make her appt with dfk on 12/18/20

## 2020-12-16 ENCOUNTER — Other Ambulatory Visit: Payer: Self-pay | Admitting: Nurse Practitioner

## 2020-12-16 DIAGNOSIS — K219 Gastro-esophageal reflux disease without esophagitis: Secondary | ICD-10-CM

## 2020-12-18 ENCOUNTER — Ambulatory Visit: Payer: Medicare Other | Admitting: Internal Medicine

## 2020-12-18 ENCOUNTER — Other Ambulatory Visit: Payer: Self-pay

## 2020-12-18 ENCOUNTER — Encounter: Payer: Self-pay | Admitting: Internal Medicine

## 2020-12-18 DIAGNOSIS — F5105 Insomnia due to other mental disorder: Secondary | ICD-10-CM | POA: Diagnosis not present

## 2020-12-18 DIAGNOSIS — F409 Phobic anxiety disorder, unspecified: Secondary | ICD-10-CM | POA: Diagnosis not present

## 2020-12-18 DIAGNOSIS — F339 Major depressive disorder, recurrent, unspecified: Secondary | ICD-10-CM | POA: Diagnosis not present

## 2020-12-18 DIAGNOSIS — F411 Generalized anxiety disorder: Secondary | ICD-10-CM

## 2020-12-18 DIAGNOSIS — E538 Deficiency of other specified B group vitamins: Secondary | ICD-10-CM | POA: Diagnosis not present

## 2020-12-18 MED ORDER — CYANOCOBALAMIN 1000 MCG/ML IJ SOLN
1000.0000 ug | Freq: Once | INTRAMUSCULAR | Status: AC
  Administered 2020-12-18: 1000 ug via INTRAMUSCULAR

## 2020-12-18 MED ORDER — DIAZEPAM 5 MG PO TABS: ORAL_TABLET | ORAL | 1 refills | Status: DC

## 2020-12-18 NOTE — Progress Notes (Signed)
[facility] [address] [place], [place] [address]  Internal MEDICINE  Office Visit Note  Patient Name: Morgan Abbott  [id number]  [id number]  Date of Service: 12/28/2020  Chief Complaint  Patient presents with  . Follow-up    Review meds  . Hyperlipidemia  . Quality Metric Gaps    mammogram    HPI  Pt is here for routine follow up. She continues to be in depressed mood after loss of her father's death due to choking. She has been having sleeping problems due to flash back. Her symptoms are somewhat better on Zoloft. She is able to taper down dose of valium. She has been in tears often since this happened. Does have chronic pain and is seen by pain management and she is on chronic narcotic therapy  Denies any c/p or sob at the moment Will like to get B12 inj today    Current Medication: Outpatient Encounter Medications as of 12/18/2020  Medication Sig  . betamethasone dipropionate 0.05 % cream Apply topically 2 (two) times daily.  . diazepam (VALIUM) 5 MG tablet Take one tab po qhs for insomnia  . ergocalciferol (VITAMIN D2) 1.25 MG (50000 UT) capsule Take 1 capsule (50,000 Units total) by mouth once a week.  Marland Kitchen ibuprofen (ADVIL,MOTRIN) 800 MG tablet Take 800 mg by mouth 3 (three) times daily as needed.  Marland Kitchen oxyCODONE-acetaminophen (PERCOCET) 10-325 MG tablet TAKE 1 TABLET BY MOUTH EVERY 4 TO 6 HOURS AS NEEDED *MAX OF 5 A DAY* *DNF UNTIL 10/10/17*  . [DISCONTINUED] diazepam (VALIUM) 2 MG tablet Take 0.5-1 tablets by mouth daily as needed for anxiety  . [DISCONTINUED] diazepam (VALIUM) 5 MG tablet Take one tab po qhs for insomnia  . [DISCONTINUED] omeprazole (PRILOSEC) 40 MG capsule Take 1 capsule (40 mg total) by mouth daily.  . [DISCONTINUED] sertraline (ZOLOFT) 100 MG tablet Take 1 tablet (100 mg total) by mouth daily.  . [EXPIRED] cyanocobalamin ((VITAMIN B-12)) injection 1,000 mcg    No facility-administered encounter medications on file as of 12/18/2020.     Surgical History: Past Surgical History:  Procedure Laterality Date  . CHOLECYSTECTOMY  1996  . COLON RESECTION    . dilatation and curettage    . TONSILLECTOMY Bilateral     Medical History: Past Medical History:  Diagnosis Date  . Allergic rhinitis   . Arthritis   . Bronchitis   . Hyperlipidemia   . Migraines     Family History: Family History  Problem Relation Age of Onset  . Hypertension Mother   . Bladder Cancer Father        15 yrs old when passed away    Social History   Socioeconomic History  . Marital status: Married    Spouse name: Not on file  . Number of children: Not on file  . Years of education: Not on file  . Highest education level: Not on file  Occupational History  . Not on file  Tobacco Use  . Smoking status: Never Smoker  . Smokeless tobacco: Never Used  Substance and Sexual Activity  . Alcohol use: No  . Drug use: No  . Sexual activity: Not on file  Other Topics Concern  . Not on file  Social History Narrative  . Not on file   Social Determinants of Health   Financial Resource Strain: Not on file  Food Insecurity: Not on file  Transportation Needs: Not on file  Physical Activity: Not on file  Stress: Not on file  Social Connections: Not on file  Intimate Partner Violence: Not on file      Review of Systems  Constitutional: Negative for chills, diaphoresis and fatigue.  HENT: Negative for ear pain, postnasal drip and sinus pressure.   Eyes: Negative for photophobia, discharge, redness, itching and visual disturbance.  Respiratory: Negative for cough, shortness of breath and wheezing.   Cardiovascular: Negative for chest pain, palpitations and leg swelling.  Gastrointestinal: Negative for abdominal pain, constipation, diarrhea, nausea and vomiting.  Genitourinary: Negative for dysuria and flank pain.  Musculoskeletal: Negative for arthralgias, back pain, gait problem and neck pain.  Skin: Negative for color change.   Allergic/Immunologic: Negative for environmental allergies and food allergies.  Neurological: Negative for dizziness and headaches.  Hematological: Does not bruise/bleed easily.  Psychiatric/Behavioral: Positive for behavioral problems (depression), dysphoric mood and sleep disturbance. Negative for agitation, hallucinations and self-injury. The patient is nervous/anxious.     Vital Signs: BP 124/86 Comment: 142/96  Pulse 83   Temp 97.8 F (36.6 C)   Resp 16   Ht 5\' 3"  (1.6 m)   Wt 158 lb 6.4 oz (71.8 kg)   SpO2 97%   BMI 28.06 kg/m    Physical Exam Constitutional:      General: She is not in acute distress.    Appearance: She is well-developed. She is not diaphoretic.  HENT:     Head: Normocephalic and atraumatic.     Mouth/Throat:     Pharynx: No oropharyngeal exudate.  Eyes:     Pupils: Pupils are equal, round, and reactive to light.  Neck:     Thyroid: No thyromegaly.     Vascular: No JVD.     Trachea: No tracheal deviation.  Cardiovascular:     Rate and Rhythm: Normal rate and regular rhythm.     Heart sounds: Normal heart sounds. No murmur heard. No friction rub. No gallop.   Pulmonary:     Effort: Pulmonary effort is normal. No respiratory distress.     Breath sounds: No wheezing or rales.  Chest:     Chest wall: No tenderness.  Abdominal:     General: Bowel sounds are normal.     Palpations: Abdomen is soft.  Musculoskeletal:        General: Normal range of motion.     Cervical back: Normal range of motion and neck supple.  Lymphadenopathy:     Cervical: No cervical adenopathy.  Skin:    General: Skin is warm and dry.  Neurological:     Mental Status: She is alert and oriented to person, place, and time.     Cranial Nerves: No cranial nerve deficit.  Psychiatric:        Behavior: Behavior normal.        Thought Content: Thought content normal.        Judgment: Judgment normal.     Assessment/Plan: 1. Insomnia due to anxiety and fear She is  unable to tolerate Remeron, was intolerant to Ambien as well. Will continue Valium 5 mg only at bed time - diazepam (VALIUM) 5 MG tablet; Take one tab po qhs for insomnia  Dispense: 30 tablet; Refill: 1  2. Major depression, recurrent, chronic (HCC) Increase Zoloft to 100 mg po qhs, titrate to symptom control  3. B12 deficiency - cyanocobalamin ((VITAMIN B-12)) injection 1,000 mcg  General Counseling: Morgan Abbott verbalizes understanding of the findings of todays visit and agrees with plan of treatment. I have discussed any further diagnostic evaluation that may be needed  or ordered today. We also reviewed her medications today. she has been encouraged to call the office with any questions or concerns that should arise related to todays visit.  Meds ordered this encounter  Medications  . DISCONTD: diazepam (VALIUM) 5 MG tablet    Sig: Take one tab po qhs for insomnia    Dispense:  30 tablet    Refill:  1  . diazepam (VALIUM) 5 MG tablet    Sig: Take one tab po qhs for insomnia    Dispense:  30 tablet    Refill:  1  . cyanocobalamin ((VITAMIN B-12)) injection 1,000 mcg    Total time spent:30 Minutes Time spent includes review of chart, medications, test results, and follow up plan with the patient.   [place] Controlled Substance Database was reviewed by me.   Dr Lyndon Code Internal medicine

## 2020-12-19 ENCOUNTER — Other Ambulatory Visit: Payer: Self-pay

## 2020-12-19 DIAGNOSIS — K219 Gastro-esophageal reflux disease without esophagitis: Secondary | ICD-10-CM

## 2020-12-19 MED ORDER — OMEPRAZOLE 40 MG PO CPDR: 40.0000 mg | DELAYED_RELEASE_CAPSULE | Freq: Every day | ORAL | 3 refills | Status: DC

## 2020-12-27 ENCOUNTER — Other Ambulatory Visit: Payer: Self-pay | Admitting: Physician Assistant

## 2020-12-27 DIAGNOSIS — F321 Major depressive disorder, single episode, moderate: Secondary | ICD-10-CM

## 2021-01-09 ENCOUNTER — Ambulatory Visit: Payer: Self-pay | Admitting: Internal Medicine

## 2021-01-15 DIAGNOSIS — M25819 Other specified joint disorders, unspecified shoulder: Secondary | ICD-10-CM | POA: Diagnosis not present

## 2021-01-15 DIAGNOSIS — M48061 Spinal stenosis, lumbar region without neurogenic claudication: Secondary | ICD-10-CM | POA: Diagnosis not present

## 2021-01-15 DIAGNOSIS — M069 Rheumatoid arthritis, unspecified: Secondary | ICD-10-CM | POA: Diagnosis not present

## 2021-01-15 DIAGNOSIS — M1991 Primary osteoarthritis, unspecified site: Secondary | ICD-10-CM | POA: Diagnosis not present

## 2021-02-05 DIAGNOSIS — M79641 Pain in right hand: Secondary | ICD-10-CM | POA: Diagnosis not present

## 2021-02-05 DIAGNOSIS — M48061 Spinal stenosis, lumbar region without neurogenic claudication: Secondary | ICD-10-CM | POA: Diagnosis not present

## 2021-02-05 DIAGNOSIS — M25571 Pain in right ankle and joints of right foot: Secondary | ICD-10-CM | POA: Diagnosis not present

## 2021-02-05 DIAGNOSIS — M069 Rheumatoid arthritis, unspecified: Secondary | ICD-10-CM | POA: Diagnosis not present

## 2021-02-05 DIAGNOSIS — Z79899 Other long term (current) drug therapy: Secondary | ICD-10-CM | POA: Diagnosis not present

## 2021-02-05 DIAGNOSIS — M1991 Primary osteoarthritis, unspecified site: Secondary | ICD-10-CM | POA: Diagnosis not present

## 2021-02-05 DIAGNOSIS — M25819 Other specified joint disorders, unspecified shoulder: Secondary | ICD-10-CM | POA: Diagnosis not present

## 2021-02-05 DIAGNOSIS — M25572 Pain in left ankle and joints of left foot: Secondary | ICD-10-CM | POA: Diagnosis not present

## 2021-02-05 DIAGNOSIS — M79642 Pain in left hand: Secondary | ICD-10-CM | POA: Diagnosis not present

## 2021-02-08 ENCOUNTER — Other Ambulatory Visit: Payer: Self-pay | Admitting: Physician Assistant

## 2021-02-08 DIAGNOSIS — F339 Major depressive disorder, recurrent, unspecified: Secondary | ICD-10-CM

## 2021-02-16 DIAGNOSIS — M1712 Unilateral primary osteoarthritis, left knee: Secondary | ICD-10-CM | POA: Diagnosis not present

## 2021-02-19 ENCOUNTER — Ambulatory Visit: Payer: Medicare Other | Admitting: Nurse Practitioner

## 2021-02-19 ENCOUNTER — Encounter: Payer: Self-pay | Admitting: Nurse Practitioner

## 2021-02-19 ENCOUNTER — Other Ambulatory Visit: Payer: Self-pay

## 2021-02-19 VITALS — BP 130/86 | HR 80 | Temp 98.8°F | Resp 16 | Ht 63.0 in | Wt 157.4 lb

## 2021-02-19 DIAGNOSIS — E559 Vitamin D deficiency, unspecified: Secondary | ICD-10-CM

## 2021-02-19 DIAGNOSIS — G479 Sleep disorder, unspecified: Secondary | ICD-10-CM

## 2021-02-19 DIAGNOSIS — E538 Deficiency of other specified B group vitamins: Secondary | ICD-10-CM | POA: Diagnosis not present

## 2021-02-19 DIAGNOSIS — F411 Generalized anxiety disorder: Secondary | ICD-10-CM

## 2021-02-19 DIAGNOSIS — G43019 Migraine without aura, intractable, without status migrainosus: Secondary | ICD-10-CM | POA: Diagnosis not present

## 2021-02-19 MED ORDER — ERGOCALCIFEROL 1.25 MG (50000 UT) PO CAPS: 50000.0000 [IU] | ORAL_CAPSULE | ORAL | 3 refills | Status: DC

## 2021-02-19 MED ORDER — DIAZEPAM 5 MG PO TABS: 5.0000 mg | ORAL_TABLET | Freq: Two times a day (BID) | ORAL | 0 refills | Status: DC | PRN

## 2021-02-19 MED ORDER — UBRELVY 100 MG PO TABS: 100.0000 mg | ORAL_TABLET | Freq: Every day | ORAL | 0 refills | Status: DC | PRN

## 2021-02-19 MED ORDER — CYANOCOBALAMIN 1000 MCG/ML IJ SOLN
1000.0000 ug | Freq: Once | INTRAMUSCULAR | Status: AC
  Administered 2021-02-19: 1000 ug via INTRAMUSCULAR

## 2021-02-19 NOTE — Progress Notes (Signed)
[facility] [address] [place], [place] [address]  Internal MEDICINE  Office Visit Note  Patient Name: Morgan Abbott  [id number]  [id number]  Date of Service: 02/23/2021  Chief Complaint  Patient presents with   Follow-up    Labs results, patient has not been sleeping    HPI Fotini presents for a follow-up visit to discuss lab results.  She also reports that she has been having difficulty sleeping.  She has a history of arthritis and hyperlipidemia.  She also has allergic rhinitis and migraines.  The patient reports increased anxiety and she currently has a prescription for diazepam 5 mg as needed at bedtime.  The patient reports that sometimes she needs a dose to control her anxiety during the day and if she takes her diazepam during the day then she has no dose to take at bedtime.  The patient had labs drawn to check her B12, folate, and vitamin D levels.  All of these levels were normal.  She does take a vitamin D supplement every other week.  She is also taking vitamin B12 injections monthly administered in office.    Current Medication: Outpatient Encounter Medications as of 02/19/2021  Medication Sig   diazepam (VALIUM) 5 MG tablet Take 1 tablet (5 mg total) by mouth every 12 (twelve) hours as needed for anxiety. Or sleep   [DISCONTINUED] Ubrogepant (UBRELVY) 100 MG TABS Take 100 mg by mouth daily as needed (to abort a current migraine).   betamethasone dipropionate 0.05 % cream Apply topically 2 (two) times daily.   ergocalciferol (VITAMIN D2) 1.25 MG (50000 UT) capsule Take 1 capsule (50,000 Units total) by mouth every 14 (fourteen) days.   ibuprofen (ADVIL,MOTRIN) 800 MG tablet Take 800 mg by mouth 3 (three) times daily as needed.   omeprazole (PRILOSEC) 40 MG capsule Take 1 capsule (40 mg total) by mouth daily.   sertraline (ZOLOFT) 100 MG tablet TAKE 1 TABLET BY MOUTH EVERY DAY   UBRELVY 100 MG TABS Take 100 mg by mouth daily as needed (to abort a migraine).    [DISCONTINUED] diazepam (VALIUM) 5 MG tablet TAKE ONE TAB BY MOUTH AT BEDTIME FOR INSOMNIA and GAD   [DISCONTINUED] ergocalciferol (VITAMIN D2) 1.25 MG (50000 UT) capsule Take 1 capsule (50,000 Units total) by mouth once a week.   [DISCONTINUED] oxyCODONE-acetaminophen (PERCOCET) 10-325 MG tablet TAKE 1 TABLET BY MOUTH EVERY 4 TO 6 HOURS AS NEEDED *MAX OF 5 A DAY* *DNF UNTIL 10/10/17*   [EXPIRED] cyanocobalamin ((VITAMIN B-12)) injection 1,000 mcg    No facility-administered encounter medications on file as of 02/19/2021.    Surgical History: Past Surgical History:  Procedure Laterality Date   CHOLECYSTECTOMY  1996   COLON RESECTION     dilatation and curettage     TONSILLECTOMY Bilateral     Medical History: Past Medical History:  Diagnosis Date   Allergic rhinitis    Arthritis    Bronchitis    Hyperlipidemia    Migraines     Family History: Family History  Problem Relation Age of Onset   Hypertension Mother    Bladder Cancer Father        58 yrs old when passed away    Social History   Socioeconomic History   Marital status: Married    Spouse name: Not on file   Number of children: Not on file   Years of education: Not on file   Highest education level: Not on file  Occupational History  Not on file  Tobacco Use   Smoking status: Never   Smokeless tobacco: Never  Substance and Sexual Activity   Alcohol use: No   Drug use: No   Sexual activity: Not on file  Other Topics Concern   Not on file  Social History Narrative   Not on file   Social Determinants of Health   Financial Resource Strain: Not on file  Food Insecurity: Not on file  Transportation Needs: Not on file  Physical Activity: Not on file  Stress: Not on file  Social Connections: Not on file  Intimate Partner Violence: Not on file      Review of Systems  Constitutional:  Negative for chills, fatigue and unexpected weight change.  HENT:  Negative for congestion, rhinorrhea, sneezing and  sore throat.   Eyes:  Negative for redness.  Respiratory:  Negative for cough, chest tightness and shortness of breath.   Cardiovascular:  Negative for chest pain and palpitations.  Gastrointestinal:  Negative for abdominal pain, constipation, diarrhea, nausea and vomiting.  Genitourinary:  Negative for dysuria and frequency.  Musculoskeletal:  Negative for arthralgias, back pain, joint swelling and neck pain.  Skin:  Negative for rash.  Neurological: Negative.  Negative for tremors and numbness.  Hematological:  Negative for adenopathy. Does not bruise/bleed easily.  Psychiatric/Behavioral:  Negative for behavioral problems (Depression), sleep disturbance and suicidal ideas. The patient is not nervous/anxious.    Vital Signs: BP 130/86   Pulse 80   Temp 98.8 F (37.1 C)   Resp 16   Ht 5\' 3"  (1.6 m)   Wt 157 lb 6.4 oz (71.4 kg)   SpO2 95%   BMI 27.88 kg/m    Physical Exam Vitals reviewed.  Constitutional:      General: She is not in acute distress.    Appearance: Normal appearance. She is well-developed, well-groomed and overweight. She is not ill-appearing or diaphoretic.  HENT:     Head: Normocephalic and atraumatic.  Neck:     Thyroid: No thyromegaly.     Vascular: No JVD.     Trachea: No tracheal deviation.  Cardiovascular:     Rate and Rhythm: Normal rate and regular rhythm.     Pulses: Normal pulses.     Heart sounds: Normal heart sounds. No murmur heard.   No friction rub. No gallop.  Pulmonary:     Effort: Pulmonary effort is normal. No respiratory distress.     Breath sounds: Normal breath sounds. No wheezing or rales.  Chest:     Chest wall: No tenderness.  Skin:    General: Skin is warm and dry.     Capillary Refill: Capillary refill takes less than 2 seconds.  Neurological:     Mental Status: She is alert and oriented to person, place, and time.  Psychiatric:        Mood and Affect: Mood normal.        Behavior: Behavior normal. Behavior is  cooperative.    Assessment/Plan: 1. Sleep disturbance The patient reports difficulty sleeping especially if she needed to take her Valium during the day for anxiety since the prior prescription was as needed only 1 time per day.  Diazepam changed to 5 mg as needed every 12 hours for anxiety or sleep so the patient is able to take the diazepam for sleep as well as have an extra dose to take during the day for increased anxiety.  We will follow-up in 2 weeks to assess change in therapy. -  diazepam (VALIUM) 5 MG tablet; Take 1 tablet (5 mg total) by mouth every 12 (twelve) hours as needed for anxiety. Or sleep  Dispense: 60 tablet; Refill: 0  2. Generalized anxiety disorder Diazepam prescription changed to 5 mg every 12 hours as needed for anxiety or sleep so that she is able to have a daytime dose for anxiety and a bedtime dose for sleep. - diazepam (VALIUM) 5 MG tablet; Take 1 tablet (5 mg total) by mouth every 12 (twelve) hours as needed for anxiety. Or sleep  Dispense: 60 tablet; Refill: 0  3. Intractable migraine without aura and without status migrainosus The patient reports having migraines that are intractable without aura and having difficulty with getting rid of them.  Bernita Raisin prescribed and told my assistance card was given to patient as well as a few samples.  We will follow-up in 2 weeks to assess effectiveness of Ubrelvy on stopping migraines when she has them. - UBRELVY 100 MG TABS; Take 100 mg by mouth daily as needed (to abort a migraine).  Dispense: 16 tablet; Refill: 0  4. B12 deficiency The patient has a history of B12 deficiency and received her monthly B12 injection today in the office. - cyanocobalamin ((VITAMIN B-12)) injection 1,000 mcg  5. Vitamin D deficiency The patient has a history of vitamin D deficiency she is currently taking 50,000 units of vitamin D every other week. - ergocalciferol (VITAMIN D2) 1.25 MG (50000 UT) capsule; Take 1 capsule (50,000 Units total) by  mouth every 14 (fourteen) days.  Dispense: 4 capsule; Refill: 3   General Counseling: Lulabelle verbalizes understanding of the findings of todays visit and agrees with plan of treatment. I have discussed any further diagnostic evaluation that may be needed or ordered today. We also reviewed her medications today. she has been encouraged to call the office with any questions or concerns that should arise related to todays visit.    No orders of the defined types were placed in this encounter.   Meds ordered this encounter  Medications   ergocalciferol (VITAMIN D2) 1.25 MG (50000 UT) capsule    Sig: Take 1 capsule (50,000 Units total) by mouth every 14 (fourteen) days.    Dispense:  4 capsule    Refill:  3   diazepam (VALIUM) 5 MG tablet    Sig: Take 1 tablet (5 mg total) by mouth every 12 (twelve) hours as needed for anxiety. Or sleep    Dispense:  60 tablet    Refill:  0   DISCONTD: Ubrogepant (UBRELVY) 100 MG TABS    Sig: Take 100 mg by mouth daily as needed (to abort a current migraine).    Dispense:  30 tablet    Refill:  0   cyanocobalamin ((VITAMIN B-12)) injection 1,000 mcg   UBRELVY 100 MG TABS    Sig: Take 100 mg by mouth daily as needed (to abort a migraine).    Dispense:  16 tablet    Refill:  0    Return in about 2 weeks (around 03/05/2021) for F/U, med refill, discuss migraine meds, Rahaf Carbonell PCP.   Total time spent:30 Minutes Time spent includes review of chart, medications, test results, and follow up plan with the patient.   [place] Controlled Substance Database was reviewed by me.  This patient was seen by Sallyanne Kuster, FNP-C in collaboration with Dr. Beverely Risen as a part of collaborative care agreement.   Elise Knobloch R. Tedd Sias, MSN, FNP-C Internal medicine

## 2021-02-22 DIAGNOSIS — M25819 Other specified joint disorders, unspecified shoulder: Secondary | ICD-10-CM | POA: Diagnosis not present

## 2021-02-22 DIAGNOSIS — M069 Rheumatoid arthritis, unspecified: Secondary | ICD-10-CM | POA: Diagnosis not present

## 2021-02-22 DIAGNOSIS — M48061 Spinal stenosis, lumbar region without neurogenic claudication: Secondary | ICD-10-CM | POA: Diagnosis not present

## 2021-02-22 DIAGNOSIS — M1991 Primary osteoarthritis, unspecified site: Secondary | ICD-10-CM | POA: Diagnosis not present

## 2021-02-23 DIAGNOSIS — M48061 Spinal stenosis, lumbar region without neurogenic claudication: Secondary | ICD-10-CM | POA: Diagnosis not present

## 2021-02-23 MED ORDER — UBRELVY 100 MG PO TABS: 100.0000 mg | ORAL_TABLET | Freq: Every day | ORAL | 0 refills | Status: DC | PRN

## 2021-02-26 ENCOUNTER — Ambulatory Visit: Payer: Medicare Other | Admitting: Physician Assistant

## 2021-02-27 DIAGNOSIS — M25819 Other specified joint disorders, unspecified shoulder: Secondary | ICD-10-CM | POA: Diagnosis not present

## 2021-02-27 DIAGNOSIS — M069 Rheumatoid arthritis, unspecified: Secondary | ICD-10-CM | POA: Diagnosis not present

## 2021-02-27 DIAGNOSIS — M1991 Primary osteoarthritis, unspecified site: Secondary | ICD-10-CM | POA: Diagnosis not present

## 2021-02-27 DIAGNOSIS — M48061 Spinal stenosis, lumbar region without neurogenic claudication: Secondary | ICD-10-CM | POA: Diagnosis not present

## 2021-03-05 ENCOUNTER — Ambulatory Visit: Payer: Medicare Other | Admitting: Nurse Practitioner

## 2021-03-08 ENCOUNTER — Other Ambulatory Visit: Payer: Self-pay

## 2021-03-08 ENCOUNTER — Ambulatory Visit (INDEPENDENT_AMBULATORY_CARE_PROVIDER_SITE_OTHER): Payer: Medicare Other | Admitting: Nurse Practitioner

## 2021-03-08 ENCOUNTER — Encounter: Payer: Self-pay | Admitting: Nurse Practitioner

## 2021-03-08 VITALS — BP 129/77 | HR 94 | Temp 98.6°F | Resp 16 | Ht 63.0 in | Wt 159.8 lb

## 2021-03-08 DIAGNOSIS — L309 Dermatitis, unspecified: Secondary | ICD-10-CM | POA: Diagnosis not present

## 2021-03-08 DIAGNOSIS — Z9189 Other specified personal risk factors, not elsewhere classified: Secondary | ICD-10-CM

## 2021-03-08 DIAGNOSIS — L03116 Cellulitis of left lower limb: Secondary | ICD-10-CM | POA: Diagnosis not present

## 2021-03-08 MED ORDER — AMOXICILLIN 500 MG PO CAPS: 500.0000 mg | ORAL_CAPSULE | Freq: Three times a day (TID) | ORAL | 0 refills | Status: AC

## 2021-03-08 MED ORDER — PREDNISONE 20 MG PO TABS: 20.0000 mg | ORAL_TABLET | Freq: Two times a day (BID) | ORAL | 0 refills | Status: AC

## 2021-03-08 NOTE — Progress Notes (Signed)
[facility] [address] [place], [place] [address]  Internal MEDICINE  Office Visit Note  Patient Name: Morgan Abbott  [id number]  [id number]  Date of Service: 03/17/2021  Chief Complaint  Patient presents with   Acute Visit    Rash on left leg spreading up leg     HPI Jeannine presents for acute sick visit for rash on left leg. It is a red rash that she reports is spreading. She states it has been there for 3-4 weeks and the area is red, warm, and painful.  She is also off oxycodone and the pain patch since her pain clinic discharged her as a patient.    Current Medication:  Outpatient Encounter Medications as of 03/08/2021  Medication Sig   [EXPIRED] amoxicillin (AMOXIL) 500 MG capsule Take 1 capsule (500 mg total) by mouth 3 (three) times daily for 5 days.   betamethasone dipropionate 0.05 % cream Apply topically 2 (two) times daily.   diazepam (VALIUM) 5 MG tablet Take 1 tablet (5 mg total) by mouth every 12 (twelve) hours as needed for anxiety. Or sleep   ergocalciferol (VITAMIN D2) 1.25 MG (50000 UT) capsule Take 1 capsule (50,000 Units total) by mouth every 14 (fourteen) days.   ibuprofen (ADVIL,MOTRIN) 800 MG tablet Take 800 mg by mouth 3 (three) times daily as needed.   omeprazole (PRILOSEC) 40 MG capsule Take 1 capsule (40 mg total) by mouth daily.   [EXPIRED] predniSONE (DELTASONE) 20 MG tablet Take 1 tablet (20 mg total) by mouth 2 (two) times daily with a meal for 3 days.   sertraline (ZOLOFT) 100 MG tablet TAKE 1 TABLET BY MOUTH EVERY DAY   UBRELVY 100 MG TABS Take 100 mg by mouth daily as needed (to abort a migraine).   buprenorphine (BUTRANS) 5 MCG/HR PTWK Place 1 patch onto the skin once a week.   No facility-administered encounter medications on file as of 03/08/2021.      Medical History: Past Medical History:  Diagnosis Date   Allergic rhinitis    Arthritis    Bronchitis    Hyperlipidemia    Migraines      Vital Signs: BP 129/77    Pulse 94   Temp 98.6 F (37 C)   Resp 16   Ht 5\' 3"  (1.6 m)   Wt 159 lb 12.8 oz (72.5 kg)   SpO2 97%   BMI 28.31 kg/m    Review of Systems  Constitutional: Negative.  Negative for chills, diaphoresis, fatigue and fever.  HENT: Negative.    Respiratory: Negative.  Negative for cough, chest tightness, shortness of breath and wheezing.   Cardiovascular: Negative.  Negative for chest pain.  Musculoskeletal:  Negative for arthralgias and myalgias.  Skin:  Positive for rash.  Neurological:  Negative for dizziness, seizures, light-headedness and headaches.  Psychiatric/Behavioral:  Negative for behavioral problems. The patient is not nervous/anxious.    Physical Exam Vitals reviewed.  Cardiovascular:     Rate and Rhythm: Normal rate and regular rhythm.     Pulses: Normal pulses.     Heart sounds: Normal heart sounds.  Pulmonary:     Effort: Pulmonary effort is normal.     Breath sounds: Normal breath sounds.  Musculoskeletal:     Left lower leg: Tenderness present.  Skin:    General: Skin is warm and dry.     Capillary Refill: Capillary refill takes less than 2 seconds.     Findings: Erythema and rash (red macular rash, warm  to touch, tender, on the medial lower leg wrapping around the back of the calf) present. Rash is macular.  Neurological:     Mental Status: She is alert and oriented to person, place, and time.    Assessment/Plan: 1. Cellulitis of left lower extremity Amoxicillin ordered for developing cellulitis infection in the left lower leg.  - amoxicillin (AMOXIL) 500 MG capsule; Take 1 capsule (500 mg total) by mouth 3 (three) times daily for 5 days.  Dispense: 15 capsule; Refill: 0  2. Dermatitis due to unknown cause Oral prednisone prescribed for dermatitis like reaction to possible bug bite/scratch.  - predniSONE (DELTASONE) 20 MG tablet; Take 1 tablet (20 mg total) by mouth 2 (two) times daily with a meal for 3 days.  Dispense: 6 tablet; Refill: 0  3. At risk  for withdrawal Patient was prescribed buprenorphine patches from the pain clinic she goes to but she cannot afford them. They cost her $200 per month. She was switched from oxycodone to the patches because her pill count was off. After she explained to her provider at the pain clinic that she cannot afford the patches and wanted to go back on oxycodone she was discharged from their practice and is no longer on oxycodone or buprenorphine. No overt signs of withdrawal noted at today's office visit. Will continue to monitor and have the staff call to check in on her in about a week.     General Counseling: nevaeha finerty understanding of the findings of todays visit and agrees with plan of treatment. I have discussed any further diagnostic evaluation that may be needed or ordered today. We also reviewed her medications today. she has been encouraged to call the office with any questions or concerns that should arise related to todays visit.    Counseling:    No orders of the defined types were placed in this encounter.   Meds ordered this encounter  Medications   amoxicillin (AMOXIL) 500 MG capsule    Sig: Take 1 capsule (500 mg total) by mouth 3 (three) times daily for 5 days.    Dispense:  15 capsule    Refill:  0   predniSONE (DELTASONE) 20 MG tablet    Sig: Take 1 tablet (20 mg total) by mouth 2 (two) times daily with a meal for 3 days.    Dispense:  6 tablet    Refill:  0    [place] Controlled Substance Database was reviewed by me.  Time spent:30 Minutes  Return if symptoms worsen or fail to improve.  This patient was seen by Sallyanne Kuster, FNP-C in collaboration with Dr. Beverely Risen as a part of collaborative care agreement.  Calene Paradiso R. Tedd Sias, MSN, FNP-C Internal Medicine

## 2021-03-20 DIAGNOSIS — M13812 Other specified arthritis, left shoulder: Secondary | ICD-10-CM | POA: Diagnosis not present

## 2021-03-20 DIAGNOSIS — M25561 Pain in right knee: Secondary | ICD-10-CM | POA: Diagnosis not present

## 2021-03-20 DIAGNOSIS — M25512 Pain in left shoulder: Secondary | ICD-10-CM | POA: Diagnosis not present

## 2021-03-20 DIAGNOSIS — M13861 Other specified arthritis, right knee: Secondary | ICD-10-CM | POA: Diagnosis not present

## 2021-03-21 ENCOUNTER — Ambulatory Visit: Payer: Medicare Other | Admitting: Nurse Practitioner

## 2021-03-22 ENCOUNTER — Telehealth: Payer: Self-pay

## 2021-03-22 DIAGNOSIS — M069 Rheumatoid arthritis, unspecified: Secondary | ICD-10-CM | POA: Diagnosis not present

## 2021-03-22 DIAGNOSIS — M25819 Other specified joint disorders, unspecified shoulder: Secondary | ICD-10-CM | POA: Diagnosis not present

## 2021-03-22 DIAGNOSIS — M48061 Spinal stenosis, lumbar region without neurogenic claudication: Secondary | ICD-10-CM | POA: Diagnosis not present

## 2021-03-22 DIAGNOSIS — M1991 Primary osteoarthritis, unspecified site: Secondary | ICD-10-CM | POA: Diagnosis not present

## 2021-03-22 NOTE — Telephone Encounter (Signed)
Spoke with patient states her rash is better and she is not taking the pain med and the pain patch was not working, an now she is having issues with the other leg.LNB

## 2021-03-25 ENCOUNTER — Other Ambulatory Visit: Payer: Self-pay | Admitting: Internal Medicine

## 2021-03-25 DIAGNOSIS — K219 Gastro-esophageal reflux disease without esophagitis: Secondary | ICD-10-CM

## 2021-03-28 ENCOUNTER — Encounter: Payer: Self-pay | Admitting: Nurse Practitioner

## 2021-03-28 ENCOUNTER — Ambulatory Visit (INDEPENDENT_AMBULATORY_CARE_PROVIDER_SITE_OTHER): Payer: Medicare Other | Admitting: Nurse Practitioner

## 2021-03-28 ENCOUNTER — Other Ambulatory Visit: Payer: Self-pay

## 2021-03-28 VITALS — BP 138/88 | HR 75 | Temp 97.9°F | Resp 16 | Ht 63.0 in | Wt 161.4 lb

## 2021-03-28 DIAGNOSIS — F411 Generalized anxiety disorder: Secondary | ICD-10-CM | POA: Diagnosis not present

## 2021-03-28 DIAGNOSIS — M797 Fibromyalgia: Secondary | ICD-10-CM | POA: Diagnosis not present

## 2021-03-28 DIAGNOSIS — M1711 Unilateral primary osteoarthritis, right knee: Secondary | ICD-10-CM | POA: Diagnosis not present

## 2021-03-28 DIAGNOSIS — G479 Sleep disorder, unspecified: Secondary | ICD-10-CM

## 2021-03-28 MED ORDER — DIAZEPAM 5 MG PO TABS
5.0000 mg | ORAL_TABLET | Freq: Two times a day (BID) | ORAL | 0 refills | Status: DC | PRN
Start: 2021-03-28 — End: 2021-04-20

## 2021-03-28 NOTE — Progress Notes (Signed)
[facility] [address] [place], [place] [address]  Internal MEDICINE  Office Visit Note  Patient Name: Morgan Abbott  [id number]  [id number]  Date of Service: 03/28/2021  Chief Complaint  Patient presents with   Follow-up    Med refill, recheck left leg    HPI Morgan Abbott presents for a follow up visit for the rash to her left leg and for medication refills. Morgan Abbott was recently seen for a probably bug bite with developing cellulitis. She was treated with oral antibiotics and a short course of prednisone. Today, the rash is resolved with minimal residual changes in pigmentation of the area.  She is also requesting refill of her diazepam.  -She was seeing Smitty Knudsen PA at [facility] pain clinic in [place] for pain management. She was taking oxycodone-acetaminophen 10-325 mg. She had been going there for many years. She lives in an apartment and her pills went missing, maintenance workers have a key to her apartment to come in and fix things. She believes they may have taken them. Since those pills went missing, her provider changed her to a buprenorphine patch that cost $200 a month and she cannot afford it and the patch has not been helping, she would like to be referred to a different pain management clinic and patient will request notes from previous provider to be sent to new provider.   Current Medication: Outpatient Encounter Medications as of 03/28/2021  Medication Sig   betamethasone dipropionate 0.05 % cream Apply topically 2 (two) times daily.   ergocalciferol (VITAMIN D2) 1.25 MG (50000 UT) capsule Take 1 capsule (50,000 Units total) by mouth every 14 (fourteen) days.   ibuprofen (ADVIL,MOTRIN) 800 MG tablet Take 800 mg by mouth 3 (three) times daily as needed.   omeprazole (PRILOSEC) 40 MG capsule TAKE 1 CAPSULE (40 MG TOTAL) BY MOUTH DAILY.   sertraline (ZOLOFT) 100 MG tablet TAKE 1 TABLET BY MOUTH EVERY DAY   [DISCONTINUED] diazepam (VALIUM) 5 MG tablet Take 1 tablet (5  mg total) by mouth every 12 (twelve) hours as needed for anxiety. Or sleep   buprenorphine (BUTRANS) 5 MCG/HR PTWK Place 1 patch onto the skin once a week. (Patient not taking: Reported on 03/28/2021)   diazepam (VALIUM) 5 MG tablet Take 1 tablet (5 mg total) by mouth every 12 (twelve) hours as needed for anxiety. Or sleep   UBRELVY 100 MG TABS Take 100 mg by mouth daily as needed (to abort a migraine). (Patient not taking: Reported on 03/28/2021)   No facility-administered encounter medications on file as of 03/28/2021.    Surgical History: Past Surgical History:  Procedure Laterality Date   CHOLECYSTECTOMY  1996   COLON RESECTION     dilatation and curettage     TONSILLECTOMY Bilateral     Medical History: Past Medical History:  Diagnosis Date   Allergic rhinitis    Arthritis    Bronchitis    Hyperlipidemia    Migraines     Family History: Family History  Problem Relation Age of Onset   Hypertension Mother    Bladder Cancer Father        66 yrs old when passed away    Social History   Socioeconomic History   Marital status: Married    Spouse name: Not on file   Number of children: Not on file   Years of education: Not on file   Highest education level: Not on file  Occupational History   Not on file  Tobacco  Use   Smoking status: Never   Smokeless tobacco: Never  Substance and Sexual Activity   Alcohol use: No   Drug use: No   Sexual activity: Not on file  Other Topics Concern   Not on file  Social History Narrative   Not on file   Social Determinants of Health   Financial Resource Strain: Not on file  Food Insecurity: Not on file  Transportation Needs: Not on file  Physical Activity: Not on file  Stress: Not on file  Social Connections: Not on file  Intimate Partner Violence: Not on file      Review of Systems  Constitutional:  Negative for chills, fatigue and unexpected weight change.  HENT:  Negative for congestion, rhinorrhea, sneezing and  sore throat.   Eyes:  Negative for redness.  Respiratory:  Negative for cough, chest tightness and shortness of breath.   Cardiovascular:  Negative for chest pain and palpitations.  Gastrointestinal:  Negative for abdominal pain, constipation, diarrhea, nausea and vomiting.  Genitourinary:  Negative for dysuria and frequency.  Musculoskeletal:  Positive for arthralgias (knee pain, severe arthritis). Negative for back pain, joint swelling and neck pain.  Skin:  Negative for rash.  Neurological: Negative.  Negative for tremors and numbness.  Hematological:  Negative for adenopathy. Does not bruise/bleed easily.  Psychiatric/Behavioral:  Negative for behavioral problems (Depression), sleep disturbance and suicidal ideas. The patient is not nervous/anxious.    Vital Signs: BP 138/88   Pulse 75   Temp 97.9 F (36.6 C)   Resp 16   Ht 5\' 3"  (1.6 m)   Wt 161 lb 6.4 oz (73.2 kg)   SpO2 98%   BMI 28.59 kg/m    Physical Exam Vitals reviewed.  Constitutional:      General: She is not in acute distress.    Appearance: She is well-developed. She is not diaphoretic.  HENT:     Head: Normocephalic and atraumatic.     Mouth/Throat:     Pharynx: No oropharyngeal exudate.  Eyes:     Pupils: Pupils are equal, round, and reactive to light.  Neck:     Thyroid: No thyromegaly.     Vascular: No JVD.     Trachea: No tracheal deviation.  Cardiovascular:     Rate and Rhythm: Normal rate and regular rhythm.     Heart sounds: Normal heart sounds. No murmur heard.   No friction rub. No gallop.  Pulmonary:     Effort: Pulmonary effort is normal. No respiratory distress.     Breath sounds: No wheezing or rales.  Chest:     Chest wall: No tenderness.  Abdominal:     General: Bowel sounds are normal.     Palpations: Abdomen is soft.  Musculoskeletal:     Cervical back: Normal range of motion and neck supple.     Right knee: Decreased range of motion. Tenderness present.     Left knee: Decreased  range of motion. Tenderness present.  Lymphadenopathy:     Cervical: No cervical adenopathy.  Skin:    General: Skin is warm and dry.  Neurological:     Mental Status: She is alert and oriented to person, place, and time.     Cranial Nerves: No cranial nerve deficit.  Psychiatric:        Behavior: Behavior normal.        Thought Content: Thought content normal.        Judgment: Judgment normal.       Assessment/Plan:  1. Generalized anxiety disorder Takes sertraline daily and diazepam as needed. Anxiety is manageable. Refill of diazepam ordered.  - diazepam (VALIUM) 5 MG tablet; Take 1 tablet (5 mg total) by mouth every 12 (twelve) hours as needed for anxiety. Or sleep  Dispense: 60 tablet; Refill: 0  2. Sleep disturbance Takes diazepam for sleep as needed.  - diazepam (VALIUM) 5 MG tablet; Take 1 tablet (5 mg total) by mouth every 12 (twelve) hours as needed for anxiety. Or sleep  Dispense: 60 tablet; Refill: 0  3. Fibromyalgia New referral to pain clinic with Dr. Cherylann Ratel.  - Ambulatory referral to Pain Clinic  4. Primary osteoarthritis of right knee Severe arthritis of the right knee, needs pain managed for this and fibromyalgia.  - Ambulatory referral to Pain Clinic   General Counseling: Morgan Abbott understanding of the findings of todays visit and agrees with plan of treatment. I have discussed any further diagnostic evaluation that may be needed or ordered today. We also reviewed her medications today. she has been encouraged to call the office with any questions or concerns that should arise related to todays visit.    Orders Placed This Encounter  Procedures   Ambulatory referral to Pain Clinic    Meds ordered this encounter  Medications   diazepam (VALIUM) 5 MG tablet    Sig: Take 1 tablet (5 mg total) by mouth every 12 (twelve) hours as needed for anxiety. Or sleep    Dispense:  60 tablet    Refill:  0    Return in about 5 months (around 08/28/2021) for  CPE, Morgan Abbott PCP previously scheduled.   Total time spent:30 Minutes Time spent includes review of chart, medications, test results, and follow up plan with the patient.   [place] Controlled Substance Database was reviewed by me.  This patient was seen by Sallyanne Kuster, FNP-C in collaboration with Dr. Beverely Risen as a part of collaborative care agreement.   Khyla Mccumbers R. Tedd Sias, MSN, FNP-C Internal medicine

## 2021-04-10 DIAGNOSIS — M19012 Primary osteoarthritis, left shoulder: Secondary | ICD-10-CM | POA: Diagnosis not present

## 2021-04-10 DIAGNOSIS — M13861 Other specified arthritis, right knee: Secondary | ICD-10-CM | POA: Diagnosis not present

## 2021-04-19 ENCOUNTER — Telehealth: Payer: Self-pay

## 2021-04-19 DIAGNOSIS — F411 Generalized anxiety disorder: Secondary | ICD-10-CM

## 2021-04-19 DIAGNOSIS — G479 Sleep disorder, unspecified: Secondary | ICD-10-CM

## 2021-04-20 MED ORDER — DIAZEPAM 5 MG PO TABS
5.0000 mg | ORAL_TABLET | Freq: Two times a day (BID) | ORAL | 0 refills | Status: DC | PRN
Start: 2021-04-20 — End: 2021-05-17

## 2021-04-20 NOTE — Telephone Encounter (Signed)
Med sent to pharmacy.

## 2021-05-16 ENCOUNTER — Ambulatory Visit (INDEPENDENT_AMBULATORY_CARE_PROVIDER_SITE_OTHER): Payer: Medicare Other | Admitting: Nurse Practitioner

## 2021-05-16 ENCOUNTER — Encounter: Payer: Self-pay | Admitting: Nurse Practitioner

## 2021-05-16 ENCOUNTER — Other Ambulatory Visit: Payer: Self-pay

## 2021-05-16 VITALS — BP 140/84 | HR 85 | Temp 98.5°F | Resp 16 | Ht 63.0 in | Wt 169.6 lb

## 2021-05-16 DIAGNOSIS — M797 Fibromyalgia: Secondary | ICD-10-CM

## 2021-05-16 DIAGNOSIS — G479 Sleep disorder, unspecified: Secondary | ICD-10-CM | POA: Diagnosis not present

## 2021-05-16 DIAGNOSIS — M1711 Unilateral primary osteoarthritis, right knee: Secondary | ICD-10-CM | POA: Diagnosis not present

## 2021-05-16 DIAGNOSIS — F411 Generalized anxiety disorder: Secondary | ICD-10-CM | POA: Diagnosis not present

## 2021-05-16 DIAGNOSIS — E538 Deficiency of other specified B group vitamins: Secondary | ICD-10-CM | POA: Diagnosis not present

## 2021-05-16 MED ORDER — CYANOCOBALAMIN 1000 MCG/ML IJ SOLN
1000.0000 ug | Freq: Once | INTRAMUSCULAR | Status: AC
Start: 2021-05-16 — End: 2021-05-16
  Administered 2021-05-16: 1000 ug via INTRAMUSCULAR

## 2021-05-16 NOTE — Progress Notes (Signed)
[facility] [address] [place], [place] [address]  Internal MEDICINE  Office Visit Note  Patient Name: Morgan Abbott  [id number]  [id number]  Date of Service: 05/16/2021  Chief Complaint  Patient presents with   Follow-up    Refills, discuss pain management    Hyperlipidemia   Quality Metric Gaps    Dexa pt refused     HPI Azyria presents for a follow-up visit for chronic pain and medication refills.  She is due for her BMD screening but she has declined this preventive screening.  She has had her pain managed by [facility] pain clinic in [place].  She is no longer going to that clinic.  A referral was placed for the patient to see Dr. Cherylann Ratel.he did not accept the patient because he is a spinal specialist and not a pain management specialist.  The patient is still interested in being referred to a chronic pain medicine specialist.     Current Medication: Outpatient Encounter Medications as of 05/16/2021  Medication Sig   betamethasone dipropionate 0.05 % cream Apply topically 2 (two) times daily.   ergocalciferol (VITAMIN D2) 1.25 MG (50000 UT) capsule Take 1 capsule (50,000 Units total) by mouth every 14 (fourteen) days.   ibuprofen (ADVIL,MOTRIN) 800 MG tablet Take 800 mg by mouth 3 (three) times daily as needed.   omeprazole (PRILOSEC) 40 MG capsule TAKE 1 CAPSULE (40 MG TOTAL) BY MOUTH DAILY.   sertraline (ZOLOFT) 100 MG tablet TAKE 1 TABLET BY MOUTH EVERY DAY   [DISCONTINUED] diazepam (VALIUM) 5 MG tablet Take 1 tablet (5 mg total) by mouth every 12 (twelve) hours as needed for anxiety. Or sleep   diazepam (VALIUM) 5 MG tablet Take 1 tablet (5 mg total) by mouth every 12 (twelve) hours as needed for anxiety. Or sleep   [DISCONTINUED] buprenorphine (BUTRANS) 5 MCG/HR PTWK Place 1 patch onto the skin once a week. (Patient not taking: Reported on 03/28/2021)   [DISCONTINUED] UBRELVY 100 MG TABS Take 100 mg by mouth daily as needed (to abort a migraine). (Patient not  taking: Reported on 03/28/2021)   [EXPIRED] cyanocobalamin ((VITAMIN B-12)) injection 1,000 mcg    No facility-administered encounter medications on file as of 05/16/2021.    Surgical History: Past Surgical History:  Procedure Laterality Date   CHOLECYSTECTOMY  1996   COLON RESECTION     dilatation and curettage     TONSILLECTOMY Bilateral     Medical History: Past Medical History:  Diagnosis Date   Allergic rhinitis    Arthritis    Bronchitis    Hyperlipidemia    Migraines     Family History: Family History  Problem Relation Age of Onset   Hypertension Mother    Bladder Cancer Father        6 yrs old when passed away    Social History   Socioeconomic History   Marital status: Married    Spouse name: Not on file   Number of children: Not on file   Years of education: Not on file   Highest education level: Not on file  Occupational History   Not on file  Tobacco Use   Smoking status: Never   Smokeless tobacco: Never  Substance and Sexual Activity   Alcohol use: No   Drug use: No   Sexual activity: Not on file  Other Topics Concern   Not on file  Social History Narrative   Not on file   Social Determinants of Health   Financial  Resource Strain: Not on file  Food Insecurity: Not on file  Transportation Needs: Not on file  Physical Activity: Not on file  Stress: Not on file  Social Connections: Not on file  Intimate Partner Violence: Not on file      Review of Systems  Constitutional:  Negative for chills, fatigue and unexpected weight change.  HENT:  Negative for congestion, rhinorrhea, sneezing and sore throat.   Eyes:  Negative for redness.  Respiratory:  Negative for cough, chest tightness and shortness of breath.   Cardiovascular:  Negative for chest pain and palpitations.  Gastrointestinal:  Negative for abdominal pain, constipation, diarrhea, nausea and vomiting.  Genitourinary:  Negative for dysuria and frequency.  Musculoskeletal:   Negative for arthralgias, back pain, joint swelling and neck pain.  Skin:  Negative for rash.  Neurological: Negative.  Negative for tremors and numbness.  Hematological:  Negative for adenopathy. Does not bruise/bleed easily.  Psychiatric/Behavioral:  Negative for behavioral problems (Depression), sleep disturbance and suicidal ideas. The patient is not nervous/anxious.    Vital Signs: BP 140/84   Pulse 85   Temp 98.5 F (36.9 C)   Resp 16   Ht 5\' 3"  (1.6 m)   Wt 169 lb 9.6 oz (76.9 kg)   SpO2 96%   BMI 30.04 kg/m    Physical Exam Constitutional:      General: She is not in acute distress.    Appearance: Normal appearance. She is normal weight. She is not ill-appearing.  HENT:     Head: Normocephalic and atraumatic.  Eyes:     Extraocular Movements: Extraocular movements intact.     Pupils: Pupils are equal, round, and reactive to light.  Cardiovascular:     Rate and Rhythm: Normal rate and regular rhythm.  Pulmonary:     Effort: Pulmonary effort is normal. No respiratory distress.  Neurological:     Mental Status: She is alert and oriented to person, place, and time.     Cranial Nerves: No cranial nerve deficit.     Coordination: Coordination normal.     Gait: Gait normal.  Psychiatric:        Mood and Affect: Mood normal.        Behavior: Behavior normal.       Assessment/Plan: 1. Primary osteoarthritis of right knee Refer to [facility] in [place]. Patient is interested in knee replacement therapy but every provider she sees for her knee tells her that she has to go steroid injections first.  - Ambulatory referral to Pain Clinic  2. Fibromyalgia Refer to [facility] in [place]. - Ambulatory referral to Pain Clinic  3. B12 deficiency B12 injection administered at today's office visit.  - cyanocobalamin ((VITAMIN B-12)) injection 1,000 mcg  4. Generalized anxiety disorder Refill ordered. PDMP reviewed.  - diazepam (VALIUM) 5 MG  tablet; Take 1 tablet (5 mg total) by mouth every 12 (twelve) hours as needed for anxiety. Or sleep  Dispense: 60 tablet; Refill: 0  5. Sleep disturbance She also takes diazepam for sleep.  - diazepam (VALIUM) 5 MG tablet; Take 1 tablet (5 mg total) by mouth every 12 (twelve) hours as needed for anxiety. Or sleep  Dispense: 60 tablet; Refill: 0   General Counseling: Rexann verbalizes understanding of the findings of todays visit and agrees with plan of treatment. I have discussed any further diagnostic evaluation that may be needed or ordered today. We also reviewed her medications today. she has been encouraged to call the office  with any questions or concerns that should arise related to todays visit.    Orders Placed This Encounter  Procedures   Ambulatory referral to Pain Clinic    Meds ordered this encounter  Medications   cyanocobalamin ((VITAMIN B-12)) injection 1,000 mcg   diazepam (VALIUM) 5 MG tablet    Sig: Take 1 tablet (5 mg total) by mouth every 12 (twelve) hours as needed for anxiety. Or sleep    Dispense:  60 tablet    Refill:  0    Return in about 2 months (around 07/16/2021) for F/U, med refill, Cordarrius Coad PCP, do not schedule on 10/30.   Total time spent:20 Minutes Time spent includes review of chart, medications, test results, and follow up plan with the patient.   [place] Controlled Substance Database was reviewed by me.  This patient was seen by Sallyanne Kuster, FNP-C in collaboration with Dr. Beverely Risen as a part of collaborative care agreement.   Darron Stuck R. Tedd Sias, MSN, FNP-C Internal medicine

## 2021-05-17 ENCOUNTER — Telehealth: Payer: Self-pay

## 2021-05-17 MED ORDER — DIAZEPAM 5 MG PO TABS: 5.0000 mg | ORAL_TABLET | Freq: Two times a day (BID) | ORAL | 0 refills | Status: DC | PRN

## 2021-05-17 NOTE — Telephone Encounter (Signed)
Send message to alyssa 

## 2021-05-17 NOTE — Telephone Encounter (Signed)
Send today  

## 2021-05-25 ENCOUNTER — Ambulatory Visit: Payer: Medicare Other | Admitting: Nurse Practitioner

## 2021-06-03 ENCOUNTER — Encounter: Payer: Self-pay | Admitting: Nurse Practitioner

## 2021-06-04 ENCOUNTER — Telehealth: Payer: Self-pay

## 2021-06-04 NOTE — Telephone Encounter (Signed)
Pain management referral manually faxed to [facility] @ [phone]

## 2021-06-13 ENCOUNTER — Telehealth: Payer: Self-pay

## 2021-06-13 NOTE — Telephone Encounter (Signed)
Patient called inquiring about referral. I told her I s/w Katie on 06/06/21 and she verified they received referral. I gave patient their telephone #-Toni

## 2021-06-13 NOTE — Telephone Encounter (Signed)
Patient called stating she will need refill on her diazepam sent to [address] [facility]. She will like a call back letting her know it has been sent-Toni

## 2021-06-13 NOTE — Telephone Encounter (Signed)
[facility] called back stating [facility] does not accept her insurance. I told her to call her insurance to get list of pain management offices that do and to call me back.-Toni

## 2021-06-14 ENCOUNTER — Other Ambulatory Visit: Payer: Self-pay | Admitting: Nurse Practitioner

## 2021-06-14 DIAGNOSIS — G479 Sleep disorder, unspecified: Secondary | ICD-10-CM

## 2021-06-14 DIAGNOSIS — F411 Generalized anxiety disorder: Secondary | ICD-10-CM

## 2021-06-14 NOTE — Telephone Encounter (Signed)
Please send 05/17/21 next 11/22

## 2021-06-21 ENCOUNTER — Telehealth: Payer: Self-pay

## 2021-06-21 NOTE — Telephone Encounter (Signed)
Received call from granddaughter that patient has cough and congestion. Wanted to know if provider could call meds into pharmacy. I explained to her patient would need to be seen to determine exactly what she has. I explained 06/25/21 was 1st available appointment we had. She stated she would take her to urgent care-Toni

## 2021-06-22 ENCOUNTER — Inpatient Hospital Stay
Admission: EM | Admit: 2021-06-22 | Discharge: 2021-06-27 | DRG: 193 | Disposition: A | Discharge status: Home Health | Payer: Medicare Other | Source: Ambulatory Visit | Attending: Internal Medicine | Admitting: Internal Medicine

## 2021-06-22 ENCOUNTER — Emergency Department: Payer: Medicare Other

## 2021-06-22 ENCOUNTER — Other Ambulatory Visit: Payer: Self-pay

## 2021-06-22 DIAGNOSIS — Z8052 Family history of malignant neoplasm of bladder: Secondary | ICD-10-CM | POA: Diagnosis not present

## 2021-06-22 DIAGNOSIS — Z8249 Family history of ischemic heart disease and other diseases of the circulatory system: Secondary | ICD-10-CM

## 2021-06-22 DIAGNOSIS — K219 Gastro-esophageal reflux disease without esophagitis: Secondary | ICD-10-CM | POA: Diagnosis present

## 2021-06-22 DIAGNOSIS — F411 Generalized anxiety disorder: Secondary | ICD-10-CM | POA: Diagnosis not present

## 2021-06-22 DIAGNOSIS — A419 Sepsis, unspecified organism: Secondary | ICD-10-CM | POA: Diagnosis not present

## 2021-06-22 DIAGNOSIS — R0602 Shortness of breath: Secondary | ICD-10-CM | POA: Diagnosis not present

## 2021-06-22 DIAGNOSIS — R059 Cough, unspecified: Secondary | ICD-10-CM | POA: Diagnosis not present

## 2021-06-22 DIAGNOSIS — I248 Other forms of acute ischemic heart disease: Secondary | ICD-10-CM | POA: Diagnosis present

## 2021-06-22 DIAGNOSIS — J45901 Unspecified asthma with (acute) exacerbation: Secondary | ICD-10-CM

## 2021-06-22 DIAGNOSIS — M1990 Unspecified osteoarthritis, unspecified site: Secondary | ICD-10-CM | POA: Diagnosis present

## 2021-06-22 DIAGNOSIS — Z79891 Long term (current) use of opiate analgesic: Secondary | ICD-10-CM | POA: Diagnosis not present

## 2021-06-22 DIAGNOSIS — Z79899 Other long term (current) drug therapy: Secondary | ICD-10-CM

## 2021-06-22 DIAGNOSIS — J121 Respiratory syncytial virus pneumonia: Principal | ICD-10-CM | POA: Diagnosis present

## 2021-06-22 DIAGNOSIS — Z9049 Acquired absence of other specified parts of digestive tract: Secondary | ICD-10-CM | POA: Diagnosis not present

## 2021-06-22 DIAGNOSIS — Z20822 Contact with and (suspected) exposure to covid-19: Secondary | ICD-10-CM | POA: Diagnosis present

## 2021-06-22 DIAGNOSIS — I214 Non-ST elevation (NSTEMI) myocardial infarction: Secondary | ICD-10-CM | POA: Diagnosis not present

## 2021-06-22 DIAGNOSIS — J189 Pneumonia, unspecified organism: Secondary | ICD-10-CM

## 2021-06-22 DIAGNOSIS — J9811 Atelectasis: Secondary | ICD-10-CM | POA: Diagnosis not present

## 2021-06-22 DIAGNOSIS — J90 Pleural effusion, not elsewhere classified: Secondary | ICD-10-CM | POA: Diagnosis not present

## 2021-06-22 DIAGNOSIS — E785 Hyperlipidemia, unspecified: Secondary | ICD-10-CM | POA: Diagnosis not present

## 2021-06-22 DIAGNOSIS — R7989 Other specified abnormal findings of blood chemistry: Secondary | ICD-10-CM

## 2021-06-22 DIAGNOSIS — Z85038 Personal history of other malignant neoplasm of large intestine: Secondary | ICD-10-CM | POA: Diagnosis not present

## 2021-06-22 DIAGNOSIS — R0600 Dyspnea, unspecified: Secondary | ICD-10-CM | POA: Diagnosis not present

## 2021-06-22 DIAGNOSIS — F32A Depression, unspecified: Secondary | ICD-10-CM | POA: Diagnosis not present

## 2021-06-22 DIAGNOSIS — J9601 Acute respiratory failure with hypoxia: Secondary | ICD-10-CM | POA: Diagnosis present

## 2021-06-22 DIAGNOSIS — R509 Fever, unspecified: Secondary | ICD-10-CM | POA: Diagnosis not present

## 2021-06-22 DIAGNOSIS — R778 Other specified abnormalities of plasma proteins: Secondary | ICD-10-CM | POA: Diagnosis not present

## 2021-06-22 DIAGNOSIS — R531 Weakness: Secondary | ICD-10-CM | POA: Diagnosis not present

## 2021-06-22 DIAGNOSIS — G8929 Other chronic pain: Secondary | ICD-10-CM | POA: Diagnosis not present

## 2021-06-22 LAB — BASIC METABOLIC PANEL
Anion gap: 11 (ref 5–15)
BUN: 21 mg/dL (ref 8–23)
CO2: 25 mmol/L (ref 22–32)
Calcium: 8.8 mg/dL — ABNORMAL LOW (ref 8.9–10.3)
Chloride: 97 mmol/L — ABNORMAL LOW (ref 98–111)
Creatinine, Ser: 0.99 mg/dL (ref 0.44–1.00)
GFR, Estimated: 60 mL/min — ABNORMAL LOW (ref >60)
Glucose, Bld: 121 mg/dL — ABNORMAL HIGH (ref 70–99)
Potassium: 4.4 mmol/L (ref 3.5–5.1)
Sodium: 133 mmol/L — ABNORMAL LOW (ref 135–145)

## 2021-06-22 LAB — CBC
HCT: 36.3 % (ref 36.0–46.0)
Hemoglobin: 12.5 g/dL (ref 12.0–15.0)
MCH: 31.3 pg (ref 26.0–34.0)
MCHC: 34.4 g/dL (ref 30.0–36.0)
MCV: 90.8 fL (ref 80.0–100.0)
Platelets: 183 10*3/uL (ref 150–400)
RBC: 4 MIL/uL (ref 3.87–5.11)
RDW: 12.8 % (ref 11.5–15.5)
WBC: 7.8 10*3/uL (ref 4.0–10.5)
nRBC: 0 % (ref 0.0–0.2)

## 2021-06-22 LAB — RESPIRATORY PANEL BY PCR

## 2021-06-22 LAB — APTT: aPTT: 25 seconds (ref 24–36)

## 2021-06-22 LAB — PROTIME-INR
INR: 1.1 (ref 0.8–1.2)
Prothrombin Time: 13.7 seconds (ref 11.4–15.2)

## 2021-06-22 LAB — LACTIC ACID, PLASMA
Lactic Acid, Venous: 1.3 mmol/L (ref 0.5–1.9)
Lactic Acid, Venous: 1.1 mmol/L (ref 0.5–1.9)

## 2021-06-22 LAB — URINALYSIS, COMPLETE (UACMP) WITH MICROSCOPIC
Bacteria, UA: NONE SEEN
Bilirubin Urine: NEGATIVE
Glucose, UA: NEGATIVE mg/dL
Ketones, ur: 5 mg/dL — AB
Leukocytes,Ua: NEGATIVE
Nitrite: NEGATIVE
Protein, ur: 30 mg/dL — AB
Specific Gravity, Urine: >1.046 — ABNORMAL HIGH (ref 1.005–1.030)
pH: 6 (ref 5.0–8.0)

## 2021-06-22 LAB — RESP PANEL BY RT-PCR (FLU A&B, COVID) ARPGX2
Influenza A by PCR: NEGATIVE
Influenza B by PCR: NEGATIVE
SARS Coronavirus 2 by RT PCR: NEGATIVE

## 2021-06-22 LAB — TROPONIN I (HIGH SENSITIVITY)
Troponin I (High Sensitivity): 673 ng/L (ref <18)
Troponin I (High Sensitivity): 548 ng/L (ref <18)

## 2021-06-22 MED ORDER — SERTRALINE HCL 50 MG PO TABS
100.0000 mg | ORAL_TABLET | Freq: Every day | ORAL | Status: DC
  Administered 2021-06-22: 100 mg via ORAL
  Filled 2021-06-22 (×5): qty 2

## 2021-06-22 MED ORDER — PANTOPRAZOLE SODIUM 40 MG PO TBEC
40.0000 mg | DELAYED_RELEASE_TABLET | Freq: Every day | ORAL | Status: DC
  Administered 2021-06-22 – 2021-06-27 (×6): 40 mg via ORAL
  Filled 2021-06-22 (×6): qty 1

## 2021-06-22 MED ORDER — OXYCODONE-ACETAMINOPHEN 5-325 MG PO TABS
1.0000 | ORAL_TABLET | Freq: Two times a day (BID) | ORAL | Status: DC
Start: 2021-06-22 — End: 2021-06-27
  Administered 2021-06-23 – 2021-06-27 (×9): 1 via ORAL
  Filled 2021-06-22 (×9): qty 1

## 2021-06-22 MED ORDER — SODIUM CHLORIDE 0.9 % IV BOLUS (SEPSIS)
1000.0000 mL | Freq: Once | INTRAVENOUS | Status: AC
  Administered 2021-06-22: 1000 mL via INTRAVENOUS

## 2021-06-22 MED ORDER — AZITHROMYCIN 500 MG IV SOLR
500.0000 mg | INTRAVENOUS | Status: DC
Start: 2021-06-22 — End: 2021-06-23
  Administered 2021-06-22: 500 mg via INTRAVENOUS
  Filled 2021-06-22 (×3): qty 500

## 2021-06-22 MED ORDER — LACTATED RINGERS IV SOLN: INTRAVENOUS | Status: AC

## 2021-06-22 MED ORDER — HEPARIN (PORCINE) 25000 UT/250ML-% IV SOLN
850.0000 [IU]/h | INTRAVENOUS | Status: AC
  Administered 2021-06-22: 850 [IU]/h via INTRAVENOUS
  Filled 2021-06-22: qty 250

## 2021-06-22 MED ORDER — OXYCODONE HCL 5 MG PO TABS
5.0000 mg | ORAL_TABLET | Freq: Two times a day (BID) | ORAL | Status: DC
Start: 2021-06-22 — End: 2021-06-27
  Administered 2021-06-23 – 2021-06-27 (×9): 5 mg via ORAL
  Filled 2021-06-22 (×9): qty 1

## 2021-06-22 MED ORDER — MAGNESIUM SULFATE 2 GM/50ML IV SOLN
2.0000 g | Freq: Once | INTRAVENOUS | Status: AC
  Administered 2021-06-22: 2 g via INTRAVENOUS
  Filled 2021-06-22: qty 50

## 2021-06-22 MED ORDER — OXYCODONE-ACETAMINOPHEN 10-325 MG PO TABS: 1.0000 | ORAL_TABLET | Freq: Two times a day (BID) | ORAL | Status: DC

## 2021-06-22 MED ORDER — HEPARIN BOLUS VIA INFUSION
4000.0000 [IU] | Freq: Once | INTRAVENOUS | Status: AC
  Administered 2021-06-22: 4000 [IU] via INTRAVENOUS
  Filled 2021-06-22: qty 4000

## 2021-06-22 MED ORDER — SODIUM CHLORIDE 0.9 % IV SOLN
2.0000 g | INTRAVENOUS | Status: DC
  Administered 2021-06-22: 2 g via INTRAVENOUS
  Filled 2021-06-22 (×3): qty 20

## 2021-06-22 MED ORDER — IOHEXOL 350 MG/ML SOLN
75.0000 mL | Freq: Once | INTRAVENOUS | Status: AC | PRN
  Administered 2021-06-22: 75 mL via INTRAVENOUS

## 2021-06-22 NOTE — H&P (Addendum)
History and Physical    Morgan Abbott OFB:[id number] DOB: 07-Aug-1946 DOA: 06/22/2021  PCP: Sallyanne Kuster, NP  Patient coming from: Home  I have personally briefly reviewed patient's old medical records in [facility]  Chief Complaint: cough and congestion  HPI: Morgan Abbott is a 75 y.o. female with medical history significant for hx of remote colon cancer s/p colectomy asthma, GERD, anxiety depression, chronic pain on opioids who presents with concerns of cough and congestion.  History provided mostly by granddaughter at bedside since patient was lethargic and ill-appearing.  Patient lives with her granddaughter and works in Designer, industrial/product job.  About 2 days ago she was otherwise in her normal state of health and went to work but then that evening developed an acute wet cough with chest congestion.  Cough continue to get worse and she also experienced headache daily.  She then went to urgent care and had checks x-ray Dr. Charna Busman left-sided pleural effusion and presented here to the ED.  She denies any fever at home.  No nausea, vomiting or diarrhea.  Denies any neck stiffness or vision changes with her headache.  Denies any chest pain or tightness.  No shortness of breath.  However, granddaughter does note that at baseline she has some shortness of breath with exertion like going up the stairs or in hot weather.  No lower extremity edema.  ED Course: She had a temperature of 100F, heart rate of 94, hypoxic down to 87% and was placed on 2 L.  CBC showed no leukocytosis or anemia.  Lactate of 1.3.  Sodium of 133, potassium of 4.4, chloride of 97, creatinine of 0.99, BG 121. Troponin of 673.  EKG reviewed by me shows normal sinus rhythm.  Chest x-ray was negative for any acute process.  CTA chest was negative for PE, pulmonary edema or pneumonia.  COVID and flu PCR negative.  Patient was started on IV Rocephin and azithromycin for presumed pneumonia.  Also started on IV heparin infusion  for possible NSTEMI.  Review of Systems: Pertinent positives and negatives as above as patient was unable to provide much history due to lethargy and illness   Past Medical History:  Diagnosis Date   Allergic rhinitis    Arthritis    Bronchitis    Hyperlipidemia    Migraines     Past Surgical History:  Procedure Laterality Date   CHOLECYSTECTOMY  1996   COLON RESECTION     dilatation and curettage     TONSILLECTOMY Bilateral      reports that she has never smoked. She has never used smokeless tobacco. She reports that she does not drink alcohol and does not use drugs. Social History  No Known Allergies  Family History  Problem Relation Age of Onset   Hypertension Mother    Bladder Cancer Father        24 yrs old when passed away     Prior to Admission medications   Medication Sig Start Date End Date Taking? Authorizing Provider  betamethasone dipropionate 0.05 % cream Apply topically 2 (two) times daily. 11/16/20  Yes McDonough, Salomon Fick, PA-C  dextromethorphan-guaiFENesin (MUCINEX DM) 30-600 MG 12hr tablet Take 1 tablet by mouth 2 (two) times daily.   Yes [provider]  ibuprofen (ADVIL,MOTRIN) 800 MG tablet Take 800 mg by mouth 3 (three) times daily as needed.   Yes [provider]  methylPREDNISolone (MEDROL DOSEPAK) 4 MG TBPK tablet SMARTSIG:1 Tablet(s) By Mouth 06/13/21  Yes [provider]  omeprazole (PRILOSEC) 40 MG capsule TAKE 1 CAPSULE (40 MG TOTAL) BY MOUTH DAILY. 03/26/21  Yes Lyndon Code, MD  oxyCODONE-acetaminophen (PERCOCET) 10-325 MG tablet Take by mouth. 1 TAB BY MOUTH EVERY 12 HRS X 10 DAYS, 1 TAB EVERY 8 HRS X 10 DAYS THEN 1 TAB EVERY 6 HRS X 10 DAYS 06/19/21  Yes [provider]  sertraline (ZOLOFT) 100 MG tablet TAKE 1 TABLET BY MOUTH EVERY DAY 12/28/20  Yes Lyndon Code, MD  diazepam (VALIUM) 5 MG tablet TAKE 1 TABLET (5 MG TOTAL) BY MOUTH EVERY 12 (TWELVE) HOURS AS NEEDED FOR ANXIETY. OR SLEEP 06/14/21   Lyndon Code, MD  ergocalciferol (VITAMIN D2) 1.25 MG (50000 UT) capsule Take 1 capsule (50,000 Units total) by mouth every 14 (fourteen) days. 02/19/21   Sallyanne Kuster, NP    Physical Exam: Vitals:   06/22/21 1630 06/22/21 1705 06/22/21 1900 06/22/21 1941  BP: 108/67  123/68 (!) 153/74  Pulse: 94 82 85 84  Resp: (!) 22 20 14  (!) 25  Temp: 100 F (37.8 C)   98.8 F (37.1 C)  TempSrc: Oral   Oral  SpO2: (!) 88% 96% 97% 90%  Weight:      Height:        Constitutional: NAD, ill-appearing lethargic female sitting upright in bed mostly with eyes close and appears confused at times Vitals:   06/22/21 1630 06/22/21 1705 06/22/21 1900 06/22/21 1941  BP: 108/67  123/68 (!) 153/74  Pulse: 94 82 85 84  Resp: (!) 22 20 14  (!) 25  Temp: 100 F (37.8 C)   98.8 F (37.1 C)  TempSrc: Oral   Oral  SpO2: (!) 88% 96% 97% 90%  Weight:      Height:       Eyes: PERRL, lids and conjunctivae normal ENMT: Mucous membranes are moist. Respiratory: Diminished breath sounds throughout with a expiratory wheezing on 2 L via nasal cannula.  No labored respiration or accessory muscle use.  Has occasional productive sounding cough. Cardiovascular: Regular rate and rhythm, no murmurs / rubs / gallops. No extremity edema.  Abdomen: no tenderness, no masses palpated.  Bowel sounds positive.  Musculoskeletal: no clubbing / cyanosis. No joint deformity upper and lower extremities. Good ROM, no contractures. Normal muscle tone.  Skin: no rashes, lesions, ulcers. No induration Neurologic: Patient lethargic and drowsy but still able to follow commands and answer yes or no questions.  CN 2-12 grossly intact. Sensation intact, Strength 5/5 in all 4.  Psychiatric: Lethargic.   Labs on Admission: I have personally reviewed following labs and imaging studies  CBC: Recent Labs  Lab 06/22/21 1635  WBC 7.8  HGB 12.5  HCT 36.3  MCV 90.8  PLT 183   Basic Metabolic Panel: Recent Labs  Lab 06/22/21 1635  NA 133*   K 4.4  CL 97*  CO2 25  GLUCOSE 121*  BUN 21  CREATININE 0.99  CALCIUM 8.8*   GFR: Estimated Creatinine Clearance: 48.6 mL/min (by C-G formula based on SCr of 0.99 mg/dL). Liver Function Tests: No results for input(s): AST, ALT, ALKPHOS, BILITOT, PROT, ALBUMIN in the last 168 hours. No results for input(s): LIPASE, AMYLASE in the last 168 hours. No results for input(s): AMMONIA in the last 168 hours. Coagulation Profile: Recent Labs  Lab 06/22/21 1636  INR 1.1   Cardiac Enzymes: No results for input(s): CKTOTAL, CKMB, CKMBINDEX, TROPONINI in the last 168 hours. BNP (last 3 results) No results for input(s):  PROBNP in the last 8760 hours. HbA1C: No results for input(s): HGBA1C in the last 72 hours. CBG: No results for input(s): GLUCAP in the last 168 hours. Lipid Profile: No results for input(s): CHOL, HDL, LDLCALC, TRIG, CHOLHDL, LDLDIRECT in the last 72 hours. Thyroid Function Tests: No results for input(s): TSH, T4TOTAL, FREET4, T3FREE, THYROIDAB in the last 72 hours. Anemia Panel: No results for input(s): VITAMINB12, FOLATE, FERRITIN, TIBC, IRON, RETICCTPCT in the last 72 hours. Urine analysis:    Component Value Date/Time   BILIRUBINUR Small 04/07/2020 1525   PROTEINUR Negative 04/07/2020 1525   UROBILINOGEN 0.2 04/07/2020 1525   NITRITE Negative 04/07/2020 1525   LEUKOCYTESUR Negative 04/07/2020 1525    Radiological Exams on Admission: DG Chest 2 View  Result Date: 06/22/2021 CLINICAL DATA:  Dyspnea EXAM: CHEST - 2 VIEW COMPARISON:  03/18/2017 chest radiograph. FINDINGS: Chronic marked elevation of the left hemidiaphragm, unchanged. Stable cardiomediastinal silhouette with normal heart size. No pneumothorax. No pleural effusion. Left basilar passive atelectasis, chronic. No pulmonary edema. No acute consolidative airspace disease. IMPRESSION: Chronic marked elevation of the left hemidiaphragm with left basilar passive atelectasis. No acute cardiopulmonary disease.  Electronically Signed   By: Delbert Phenix M.D.   On: 06/22/2021 17:13   CT Angio Chest PE W and/or Wo Contrast  Result Date: 06/22/2021 CLINICAL DATA:  Short of breath, cough, left pleural effusion, abnormal chest x-ray EXAM: CT ANGIOGRAPHY CHEST WITH CONTRAST TECHNIQUE: Multidetector CT imaging of the chest was performed using the standard protocol during bolus administration of intravenous contrast. Multiplanar CT image reconstructions and MIPs were obtained to evaluate the vascular anatomy. CONTRAST:  13mL OMNIPAQUE IOHEXOL 350 MG/ML SOLN COMPARISON:  06/22/2021, 08/17/2004 FINDINGS: Cardiovascular: This is a technically adequate evaluation of the pulmonary vasculature. No filling defects or pulmonary emboli. The heart is unremarkable without pericardial effusion. Marked ectasia of the thoracic aorta without evidence of aneurysm or dissection. Mediastinum/Nodes: No enlarged mediastinal, hilar, or axillary lymph nodes. Thyroid gland, trachea, and esophagus demonstrate no significant findings. Lungs/Pleura: There is elevation of the left hemidiaphragm with compressive atelectasis involving the lingula and left lower lobe. This likely accounts for the chest x-ray finding. No acute airspace disease, effusion, or pneumothorax. Central airways are patent. Upper Abdomen: No acute abnormality. Musculoskeletal: No acute or destructive bony lesions. Reconstructed images demonstrate no additional findings. Review of the MIP images confirms the above findings. IMPRESSION: 1. No evidence of pulmonary embolus. 2. Elevated left hemidiaphragm, with compressive atelectasis at the left lung base. No acute airspace disease or effusion. Electronically Signed   By: Sharlet Salina M.D.   On: 06/22/2021 18:28      Assessment/Plan  Acute hypoxic respiratory failure secondary to community-acquired pneumonia and asthma exacerbation -Chest x-ray and CTA chest was unrevealing for pneumonia but patient clearly ill-appearing with  wheezing on lung exam.  Continue treatment for bacterial pneumonia but will also obtain RVP to assess for any viral illness. -Admitted on 2 L -Give one-time dose of IV magnesium for asthma exacerbation. -PRN albuterol   Elevated troponin - Initial troponin of 673.  We will continue to trend overnight.  However suspect she likely has baseline CAD and this infection is causing demand ischemia - Continue IV heparin and obtain echocardiogram  Chronic pain  -Continue BID oxycodone  Anxiety/depression - Continue sertraline -Hold PRN valium for now since pt is lethargic at this time  DVT prophylaxis:.IV heparin Code Status: Full Family Communication: Plan discussed with patient at bedside  disposition Plan: Home with at least  2 midnight stays  Consults called:  Admission status: inpatient  Level of care: Progressive Cardiac  Status is: Inpatient  Remains inpatient appropriate because:Inpatient level of care appropriate due to severity of illness  Dispo: The patient is from: Home              Anticipated d/c is to: Home              Patient currently is not medically stable to d/c.   Difficult to place patient No         Anselm Jungling DO [facility]   If 7PM-7AM, please contact night-coverage [email or web address]   06/22/2021, 7:47 PM

## 2021-06-22 NOTE — ED Notes (Signed)
Screen on bed scale is broken an unable to get actual weight at this time. Patient stated weight is 167# and 5'3", documented in flow sheet.

## 2021-06-22 NOTE — Consult Note (Signed)
CODE SEPSIS - PHARMACY COMMUNICATION  **Broad Spectrum Antibiotics should be administered within 1 hour of Sepsis diagnosis**  Time Code Sepsis Called/Page Received: 1712  Antibiotics Ordered: 1712  Time of 1st antibiotic administration: 1748  Additional action taken by pharmacy: N/A  If necessary, Name of Provider/Nurse Contacted: N/A    Derrek Gu ,PharmD Clinical Pharmacist  06/22/2021  5:20 PM

## 2021-06-22 NOTE — ED Provider Notes (Signed)
[facility] Emergency Department Provider Note  Time seen: 5:41 PM  I have reviewed the triage vital signs and the nursing notes.   HISTORY  Chief Complaint Shortness of Breath   HPI Morgan Abbott is a 75 y.o. female with a past medical history of bronchitis, hyperlipidemia, anemia, presents to the emergency department for shortness of breath and cough.  Per patient over the past week or so she has had a worsening cough and shortness of breath, worse with exertion.  States wet sounding cough but is not able to produce any sputum.  Denies any known fever.  No leg pain or swelling.  During my evaluation patient does have a wet sounding cough.  Denies any chest pain.  Patient went to urgent care and had an x-ray that was concerning for possible left pleural effusion or mass and was sent to the emergency department for further work-up.   Past Medical History:  Diagnosis Date   Allergic rhinitis    Arthritis    Bronchitis    Hyperlipidemia    Migraines     Patient Active Problem List   Diagnosis Date Noted   Encounter for general adult medical examination with abnormal findings 09/03/2020   Grief reaction 07/18/2020   Encounter for screening mammogram for malignant neoplasm of breast 05/06/2020   Need for vaccination against Streptococcus pneumoniae using pneumococcal conjugate vaccine 13 05/06/2020   Dysuria 05/06/2020   Other fatigue 01/01/2020   Depression, major, single episode, moderate (HCC) 01/01/2020   Mild intermittent asthma without complication 02/22/2019   Gastroesophageal reflux disease without esophagitis 02/22/2019   Generalized anxiety disorder 02/22/2019   Urinary tract infection without hematuria 10/26/2017   B12 deficiency 10/26/2017   Vitamin D deficiency 10/26/2017   Nausea 10/26/2017   Iron deficiency anemia 10/26/2017   Acute upper respiratory infection, unspecified 09/25/2017   Cough 09/25/2017    Past Surgical History:   Procedure Laterality Date   CHOLECYSTECTOMY  1996   COLON RESECTION     dilatation and curettage     TONSILLECTOMY Bilateral     Prior to Admission medications   Medication Sig Start Date End Date Taking? Authorizing Provider  betamethasone dipropionate 0.05 % cream Apply topically 2 (two) times daily. 11/16/20   McDonough, Lauren K, PA-C  diazepam (VALIUM) 5 MG tablet TAKE 1 TABLET (5 MG TOTAL) BY MOUTH EVERY 12 (TWELVE) HOURS AS NEEDED FOR ANXIETY. OR SLEEP 06/14/21   Lyndon Code, MD  ergocalciferol (VITAMIN D2) 1.25 MG (50000 UT) capsule Take 1 capsule (50,000 Units total) by mouth every 14 (fourteen) days. 02/19/21   Sallyanne Kuster, NP  ibuprofen (ADVIL,MOTRIN) 800 MG tablet Take 800 mg by mouth 3 (three) times daily as needed.    [provider]  omeprazole (PRILOSEC) 40 MG capsule TAKE 1 CAPSULE (40 MG TOTAL) BY MOUTH DAILY. 03/26/21   Lyndon Code, MD  sertraline (ZOLOFT) 100 MG tablet TAKE 1 TABLET BY MOUTH EVERY DAY 12/28/20   Lyndon Code, MD    No Known Allergies  Family History  Problem Relation Age of Onset   Hypertension Mother    Bladder Cancer Father        81 yrs old when passed away    Social History Social History   Tobacco Use   Smoking status: Never   Smokeless tobacco: Never  Substance Use Topics   Alcohol use: No   Drug use: No    Review of Systems Constitutional: No known fever at  home, 100.0 in the emergency department. ENT: Negative for recent illness/congestion Cardiovascular: Negative for chest pain. Respiratory: Positive for shortness of breath, worse with exertion.  Positive for wet sounding cough.  No sputum production. Gastrointestinal: Negative for abdominal pain Musculoskeletal: Negative for musculoskeletal complaints.  Negative for leg pain or swelling. Skin: Negative for skin complaints  Neurological: Negative for headache All other ROS negative  ____________________________________________   PHYSICAL EXAM:  VITAL  SIGNS: ED Triage Vitals  Enc Vitals Group     BP 06/22/21 1630 108/67     Pulse Rate 06/22/21 1630 94     Resp 06/22/21 1630 (!) 22     Temp 06/22/21 1630 100 F (37.8 C)     Temp Source 06/22/21 1630 Oral     SpO2 06/22/21 1630 (!) 88 %     Weight 06/22/21 1628 167 lb (75.8 kg)     Height 06/22/21 1628 5\' 3"  (1.6 m)     Head Circumference --      Peak Flow --      Pain Score 06/22/21 1628 0     Pain Loc --      Pain Edu? --      Excl. in GC? --    Constitutional: Alert and oriented. Well appearing and in no distress. Eyes: Normal exam ENT      Head: Normocephalic and atraumatic.      Mouth/Throat: Mucous membranes are moist. Cardiovascular: Normal rate, regular rhythm.  Respiratory: Normal respiratory effort without tachypnea nor retractions. Breath sounds are clear, but occasional wet sounding cough. Gastrointestinal: Soft and nontender. No distention.  Musculoskeletal: Nontender with normal range of motion in all extremities. No lower extremity tenderness or edema. Neurologic:  Normal speech and language. No gross focal neurologic deficits Skin:  Skin is warm, dry and intact.  Psychiatric: Mood and affect are normal.   ____________________________________________    EKG  EKG viewed and interpreted by myself shows a normal sinus rhythm at 99 bpm with a narrow QRS, normal axis, normal intervals, no concerning ST changes.  ____________________________________________    RADIOLOGY  Chest x-ray shows chronic left hemidiaphragm elevation with basal atelectasis. CTA negative for PE.  No acute airspace disease.  ____________________________________________   INITIAL IMPRESSION / ASSESSMENT AND PLAN / ED COURSE  Pertinent labs & imaging results that were available during my care of the patient were reviewed by me and considered in my medical decision making (see chart for details).   Patient presents to the emergency department for shortness of breath and cough  referred by urgent care.  Here patient has borderline temperature of 100.0, wet sounding cough and is hypoxic to 88% placed on 2 L nasal cannula.  No baseline O2 requirement.  Given the patient's shortness of breath and elevated temperature as well as abnormal appearing chest x-ray although largely unchanged from prior we will obtain a CTA of the chest to rule out pulmonary embolism mass or pneumonia.  COVID/flu test is pending.  Patient's troponin has resulted at 673 indicating possible NSTEMI, CTA remains pending we will start the patient on IV heparin in addition IV antibiotics.  Given the patient's elevated troponin and hypoxia she will require admission to the hospital service once her emergency department work-up is been completed.  Differential this time would include pneumonia/COVID/flu, NSTEMI, new onset CHF, mass or tumor.  CTA is negative for PE or acute airspace disease.  Given the patient's temperature we will continue antibiotics.  Patient will be admitted to the hospital  service for further work-up and treatment.  Heparin infusion was started for NSTEMI.  STEPHAIE DARDIS was evaluated in Emergency Department on 06/22/2021 for the symptoms described in the history of present illness. She was evaluated in the context of the global COVID-19 pandemic, which necessitated consideration that the patient might be at risk for infection with the SARS-CoV-2 virus that causes COVID-19. Institutional protocols and algorithms that pertain to the evaluation of patients at risk for COVID-19 are in a state of rapid change based on information released by regulatory bodies including the CDC and federal and state organizations. These policies and algorithms were followed during the patient's care in the ED.  CRITICAL CARE Performed by: Minna Antis   Total critical care time: 30 minutes  Critical care time was exclusive of separately billable procedures and treating other patients.  Critical care was  necessary to treat or prevent imminent or life-threatening deterioration.  Critical care was time spent personally by me on the following activities: development of treatment plan with patient and/or surrogate as well as nursing, discussions with consultants, evaluation of patient's response to treatment, examination of patient, obtaining history from patient or surrogate, ordering and performing treatments and interventions, ordering and review of laboratory studies, ordering and review of radiographic studies, pulse oximetry and re-evaluation of patient's condition.  ____________________________________________   FINAL CLINICAL IMPRESSION(S) / ED DIAGNOSES  Sepsis Fever NSTEMI   Minna Antis, MD 06/22/21 1849

## 2021-06-22 NOTE — ED Notes (Signed)
Lab called and they are able to add on the 20 pathogen respiratory panel to the previously collected swab.

## 2021-06-22 NOTE — Consult Note (Signed)
ANTICOAGULATION CONSULT NOTE - Initial Consult  Pharmacy Consult for Heparin Infusion Indication: chest pain/ACS  No Known Allergies  Patient Measurements: Height: 5\' 3"  (160 cm) Weight: 75.8 kg (167 lb) IBW/kg (Calculated) : 52.4 Heparin Dosing Weight: 68.6 kg  Vital Signs: Temp: 100 F (37.8 C) (10/07 1630) Temp Source: Oral (10/07 1630) BP: 108/67 (10/07 1630) Pulse Rate: 82 (10/07 1705)  Labs: Recent Labs    06/22/21 1635 06/22/21 1636  HGB 12.5  --   HCT 36.3  --   PLT 183  --   APTT  --  25  LABPROT  --  13.7  INR  --  1.1  CREATININE 0.99  --   TROPONINIHS 673*  --     Estimated Creatinine Clearance: 48.6 mL/min (by C-G formula based on SCr of 0.99 mg/dL).   Medical History: Past Medical History:  Diagnosis Date   Allergic rhinitis    Arthritis    Bronchitis    Hyperlipidemia    Migraines     Medications:  No history of scheduled anticoagulant use PTA  Assessment: Pharmacy has been consulted to initiate heparin infusion on 75yo patient complaining of SOB/cough and slight chest pain. Initial troponin level of 673 indicating possible NSTEMI, CTA currently pending. Baseline labs: aPTT 25 sec, INR 1.3, Hgb 12.5, Plts 183  Goal of Therapy:  Heparin level 0.3-0.7 units/ml Monitor platelets by anticoagulation protocol: Yes   Plan:  Give 4000 units bolus x 1 Start heparin infusion at 850 units/hr Check anti-Xa level in 8 hours and daily while on heparin Continue to monitor H&H and platelets  Morgan Abbott A Brayen Bunn 06/22/2021,6:09 PM

## 2021-06-22 NOTE — Progress Notes (Signed)
[facility] is following sepsis bundle. 

## 2021-06-22 NOTE — ED Triage Notes (Signed)
Pt comes p[facility] from [facility] for SOB/cough. They did a chest xray and found large left pleural effusion/poss mass. Sent pt here.

## 2021-06-23 ENCOUNTER — Inpatient Hospital Stay
Admit: 2021-06-23 | Discharge: 2021-06-23 | Disposition: A | Discharge status: Home / Self Care | Payer: Medicare Other | Attending: Family Medicine | Admitting: Family Medicine

## 2021-06-23 DIAGNOSIS — J9601 Acute respiratory failure with hypoxia: Secondary | ICD-10-CM | POA: Diagnosis not present

## 2021-06-23 LAB — CBC
HCT: 33.1 % — ABNORMAL LOW (ref 36.0–46.0)
Hemoglobin: 11.3 g/dL — ABNORMAL LOW (ref 12.0–15.0)
MCH: 31.7 pg (ref 26.0–34.0)
MCHC: 34.1 g/dL (ref 30.0–36.0)
MCV: 93 fL (ref 80.0–100.0)
Platelets: 138 10*3/uL — ABNORMAL LOW (ref 150–400)
RBC: 3.56 MIL/uL — ABNORMAL LOW (ref 3.87–5.11)
RDW: 12.8 % (ref 11.5–15.5)
WBC: 10 10*3/uL (ref 4.0–10.5)
nRBC: 0 % (ref 0.0–0.2)

## 2021-06-23 LAB — ECHOCARDIOGRAM COMPLETE
AV Peak grad: 8.9 mmHg
Ao pk vel: 1.49 m/s
Area-P 1/2: 3.89 cm2
Height: 63 in
S' Lateral: 2.71 cm
Weight: 2721.6 oz

## 2021-06-23 LAB — BASIC METABOLIC PANEL
Anion gap: 7 (ref 5–15)
BUN: 16 mg/dL (ref 8–23)
CO2: 28 mmol/L (ref 22–32)
Calcium: 8.1 mg/dL — ABNORMAL LOW (ref 8.9–10.3)
Chloride: 97 mmol/L — ABNORMAL LOW (ref 98–111)
Creatinine, Ser: 0.82 mg/dL (ref 0.44–1.00)
GFR, Estimated: >60 mL/min (ref >60)
Glucose, Bld: 122 mg/dL — ABNORMAL HIGH (ref 70–99)
Potassium: 4.8 mmol/L (ref 3.5–5.1)
Sodium: 132 mmol/L — ABNORMAL LOW (ref 135–145)

## 2021-06-23 LAB — PROCALCITONIN: Procalcitonin: <0.1 ng/mL

## 2021-06-23 LAB — HEPARIN LEVEL (UNFRACTIONATED): Heparin Unfractionated: 0.51 IU/mL (ref 0.30–0.70)

## 2021-06-23 MED ORDER — IPRATROPIUM-ALBUTEROL 0.5-2.5 (3) MG/3ML IN SOLN
3.0000 mL | Freq: Four times a day (QID) | RESPIRATORY_TRACT | Status: DC
  Administered 2021-06-23 – 2021-06-26 (×14): 3 mL via RESPIRATORY_TRACT
  Filled 2021-06-23 (×15): qty 3

## 2021-06-23 MED ORDER — ACETAMINOPHEN 325 MG PO TABS
650.0000 mg | ORAL_TABLET | Freq: Four times a day (QID) | ORAL | Status: DC | PRN
  Filled 2021-06-23: qty 2

## 2021-06-23 MED ORDER — BUTALBITAL-APAP-CAFFEINE 50-325-40 MG PO TABS
1.0000 | ORAL_TABLET | ORAL | Status: DC | PRN
  Administered 2021-06-23 – 2021-06-27 (×12): 1 via ORAL
  Filled 2021-06-23 (×13): qty 1

## 2021-06-23 MED ORDER — ALBUTEROL SULFATE (2.5 MG/3ML) 0.083% IN NEBU
2.5000 mg | INHALATION_SOLUTION | RESPIRATORY_TRACT | Status: DC | PRN
  Administered 2021-06-27: 2.5 mg via RESPIRATORY_TRACT
  Filled 2021-06-23: qty 3

## 2021-06-23 NOTE — Progress Notes (Signed)
*  PRELIMINARY RESULTS* Echocardiogram 2D Echocardiogram has been performed.  Lenor Coffin 06/23/2021, 7:14 AM

## 2021-06-23 NOTE — Consult Note (Signed)
ANTICOAGULATION CONSULT NOTE - Initial Consult  Pharmacy Consult for Heparin Infusion Indication: chest pain/ACS  No Known Allergies  Patient Measurements: Height: 5\' 3"  (160 cm) Weight: 77.2 kg (170 lb 1.6 oz) IBW/kg (Calculated) : 52.4 Heparin Dosing Weight: 68.6 kg  Vital Signs: Temp: 99.3 F (37.4 C) (10/08 0138) Temp Source: Oral (10/08 0138) BP: 162/83 (10/08 0138) Pulse Rate: 86 (10/08 0138)  Labs: Recent Labs    06/22/21 1635 06/22/21 1636 06/22/21 1858 06/23/21 0251  HGB 12.5  --   --  11.3*  HCT 36.3  --   --  33.1*  PLT 183  --   --  138*  APTT  --  25  --   --   LABPROT  --  13.7  --   --   INR  --  1.1  --   --   HEPARINUNFRC  --   --   --  0.51  CREATININE 0.99  --   --  0.82  TROPONINIHS 673*  --  548*  --      Estimated Creatinine Clearance: 59.2 mL/min (by C-G formula based on SCr of 0.82 mg/dL).   Medical History: Past Medical History:  Diagnosis Date   Allergic rhinitis    Arthritis    Bronchitis    Hyperlipidemia    Migraines     Medications:  No history of scheduled anticoagulant use PTA  Assessment: Pharmacy has been consulted to initiate heparin infusion on 75yo patient complaining of SOB/cough and slight chest pain. Initial troponin level of 673 indicating possible NSTEMI, CTA currently pending. Baseline labs: aPTT 25 sec, INR 1.3, Hgb 12.5, Plts 183  Goal of Therapy:  Heparin level 0.3-0.7 units/ml Monitor platelets by anticoagulation protocol: Yes  10/8 0251 HL 0.51, therapeutic x 1   Plan:  Continue heparin infusion at 850 units/hr Recheck HL in 8 hrs to confirm CBC daily while on heparin.  12/8, PharmD, [facility] 06/23/2021 4:13 AM

## 2021-06-23 NOTE — Plan of Care (Signed)

## 2021-06-23 NOTE — Progress Notes (Signed)
PROGRESS NOTE    Morgan Abbott  YTK:[id number] DOB: 09-29-1945 DOA: 06/22/2021 PCP: Sallyanne Kuster, NP    Brief Narrative:  75 y.o. female with medical history significant for hx of remote colon cancer s/p colectomy asthma, GERD, anxiety depression, chronic pain on opioids who presents with concerns of cough and congestion.   History provided mostly by granddaughter at bedside since patient was lethargic and ill-appearing.  Patient lives with her granddaughter and works in Designer, industrial/product job.  About 2 days ago she was otherwise in her normal state of health and went to work but then that evening developed an acute wet cough with chest congestion.  Cough continue to get worse and she also experienced headache daily.  She then went to urgent care and had checks x-ray Dr. Charna Busman left-sided pleural effusion and presented here to the ED.   Follow-up chest CT with contrast did not demonstrate any effusion or consolidation.  Respiratory viral panel positive for respiratory syncytial virus.   Assessment & Plan:   Principal Problem:   Acute respiratory failure with hypoxia (HCC) Active Problems:   Generalized anxiety disorder   Elevated troponin   Chronic pain   Depression   Asthma, chronic, unspecified asthma severity, with acute exacerbation  Respiratory syncytial virus infection Acute hypoxic respiratory failure CTA chest unrevealing for pneumonia Wheezing on exam RVP positive for RSV Procalcitonin negative Plan: Supportive care IV fluids Discontinue antibiotics Bronchodilators Wean oxygen as tolerated  Elevated troponin Unlikely to represent ACS NSTEMI essentially ruled out Troponins peaked, now downtrending Plan: Continue empiric and for GGT for 24 hours total, then discontinue Telemetry monitoring for 24 hours, then discontinue  Chronic pain Continue home narcotic regimen  GERD PPI  Depression Zoloft    DVT prophylaxis: Heparin GTT Code Status: Full Family  Communication: Verlin Fester [phone] on 10/8 Disposition Plan: Status is: Inpatient  Remains inpatient appropriate because:Inpatient level of care appropriate due to severity of illness  Dispo: The patient is from: Home              Anticipated d/c is to: Home              Patient currently is not medically stable to d/c.   Difficult to place patient No       Level of care: Progressive Cardiac  Consultants:  None  Procedures:  None  Antimicrobials:   Subjective: Patient seen and examined.  Reports some improvement in symptoms since admission.  Still remains very fatigued  Objective: Vitals:   06/23/21 0100 06/23/21 0138 06/23/21 0503 06/23/21 0750  BP: (!) 143/81 (!) 162/83 119/71 123/66  Pulse: 81 86 78 74  Resp: 20 (!) 22 18 17   Temp: 98.3 F (36.8 C) 99.3 F (37.4 C) 98.5 F (36.9 C) 97.9 F (36.6 C)  TempSrc: Oral Oral    SpO2: 99% 94% 98% 99%  Weight:  77.2 kg    Height:  5\' 3"  (1.6 m)      Intake/Output Summary (Last 24 hours) at 06/23/2021 1129 Last data filed at 06/23/2021 0409 Gross per 24 hour  Intake 4330.12 ml  Output --  Net 4330.12 ml   Filed Weights   06/22/21 1628 06/22/21 2047 06/23/21 0138  Weight: 75.8 kg 75.8 kg 77.2 kg    Examination:  General exam: No acute distress.  Appears fatigued Respiratory system: Lungs clear.  Normal work of breathing.  2 L Cardiovascular system: S1-S2, RRR, no murmurs, no pedal edema Gastrointestinal system: Soft, NT/ND, normal bowel sounds Central  nervous system: Alert and oriented. No focal neurological deficits. Extremities: Symmetric 5 x 5 power. Skin: No rashes, lesions or ulcers Psychiatry: Judgement and insight appear normal. Mood & affect appropriate.     Data Reviewed: I have personally reviewed following labs and imaging studies  CBC: Recent Labs  Lab 06/22/21 1635 06/23/21 0251  WBC 7.8 10.0  HGB 12.5 11.3*  HCT 36.3 33.1*  MCV 90.8 93.0  PLT 183 138*   Basic  Metabolic Panel: Recent Labs  Lab 06/22/21 1635 06/23/21 0251  NA 133* 132*  K 4.4 4.8  CL 97* 97*  CO2 25 28  GLUCOSE 121* 122*  BUN 21 16  CREATININE 0.99 0.82  CALCIUM 8.8* 8.1*   GFR: Estimated Creatinine Clearance: 59.2 mL/min (by C-G formula based on SCr of 0.82 mg/dL). Liver Function Tests: No results for input(s): AST, ALT, ALKPHOS, BILITOT, PROT, ALBUMIN in the last 168 hours. No results for input(s): LIPASE, AMYLASE in the last 168 hours. No results for input(s): AMMONIA in the last 168 hours. Coagulation Profile: Recent Labs  Lab 06/22/21 1636  INR 1.1   Cardiac Enzymes: No results for input(s): CKTOTAL, CKMB, CKMBINDEX, TROPONINI in the last 168 hours. BNP (last 3 results) No results for input(s): PROBNP in the last 8760 hours. HbA1C: No results for input(s): HGBA1C in the last 72 hours. CBG: No results for input(s): GLUCAP in the last 168 hours. Lipid Profile: No results for input(s): CHOL, HDL, LDLCALC, TRIG, CHOLHDL, LDLDIRECT in the last 72 hours. Thyroid Function Tests: No results for input(s): TSH, T4TOTAL, FREET4, T3FREE, THYROIDAB in the last 72 hours. Anemia Panel: No results for input(s): VITAMINB12, FOLATE, FERRITIN, TIBC, IRON, RETICCTPCT in the last 72 hours. Sepsis Labs: Recent Labs  Lab 06/22/21 1635 06/22/21 1739 06/23/21 0740  PROCALCITON  --   --  <0.10  LATICACIDVEN 1.3 1.1  --     Recent Results (from the past 240 hour(s))  Resp Panel by RT-PCR (Flu A&B, Covid) Nasopharyngeal Swab     Status: None   Collection Time: 06/22/21  5:39 PM   Specimen: Nasopharyngeal Swab; Nasopharyngeal(NP) swabs in vial transport medium  Result Value Ref Range Status   SARS Coronavirus 2 by RT PCR NEGATIVE NEGATIVE Final    Comment: (NOTE) SARS-CoV-2 target nucleic acids are NOT DETECTED.  The SARS-CoV-2 RNA is generally detectable in upper respiratory specimens during the acute phase of infection. The lowest concentration of SARS-CoV-2 viral  copies this assay can detect is 138 copies/mL. A negative result does not preclude SARS-Cov-2 infection and should not be used as the sole basis for treatment or other patient management decisions. A negative result may occur with  improper specimen collection/handling, submission of specimen other than nasopharyngeal swab, presence of viral mutation(s) within the areas targeted by this assay, and inadequate number of viral copies(<138 copies/mL). A negative result must be combined with clinical observations, patient history, and epidemiological information. The expected result is Negative.  Fact Sheet for Patients:  BloggerCourse.com  Fact Sheet for Healthcare Providers:  SeriousBroker.it  This test is no t yet approved or cleared by the [place] FDA and  has been authorized for detection and/or diagnosis of SARS-CoV-2 by FDA under an Emergency Use Authorization (EUA). This EUA will remain  in effect (meaning this test can be used) for the duration of the COVID-19 declaration under Section 564(b)(1) of the Act, 21 U.S.C.section 360bbb-3(b)(1), unless the authorization is terminated  or revoked sooner.       Influenza A  by PCR NEGATIVE NEGATIVE Final   Influenza B by PCR NEGATIVE NEGATIVE Final    Comment: (NOTE) The Xpert Xpress SARS-CoV-2/FLU/RSV plus assay is intended as an aid in the diagnosis of influenza from Nasopharyngeal swab specimens and should not be used as a sole basis for treatment. Nasal washings and aspirates are unacceptable for Xpert Xpress SARS-CoV-2/FLU/RSV testing.  Fact Sheet for Patients: BloggerCourse.com  Fact Sheet for Healthcare Providers: SeriousBroker.it  This test is not yet approved or cleared by the [place] FDA and has been authorized for detection and/or diagnosis of SARS-CoV-2 by FDA under an Emergency Use Authorization (EUA). This  EUA will remain in effect (meaning this test can be used) for the duration of the COVID-19 declaration under Section 564(b)(1) of the Act, 21 U.S.C. section 360bbb-3(b)(1), unless the authorization is terminated or revoked.  Performed at [facility], [address] [place], [place] [address]   Blood Culture (routine x 2)     Status: None (Preliminary result)   Collection Time: 06/22/21  5:39 PM   Specimen: BLOOD  Result Value Ref Range Status   Specimen Description BLOOD BLOOD RIGHT FOREARM  Final   Special Requests   Final    BOTTLES DRAWN AEROBIC AND ANAEROBIC Blood Culture results may not be optimal due to an inadequate volume of blood received in culture bottles   Culture   Final    NO GROWTH < 12 HOURS Performed at [facility], [address] [place], [place] [address]    Report Status PENDING  Incomplete  Blood Culture (routine x 2)     Status: None (Preliminary result)   Collection Time: 06/22/21  5:39 PM   Specimen: BLOOD  Result Value Ref Range Status   Specimen Description BLOOD LEFT ANTECUBITAL  Final   Special Requests   Final    BOTTLES DRAWN AEROBIC AND ANAEROBIC Blood Culture adequate volume   Culture   Final    NO GROWTH < 12 HOURS Performed at [facility], [address] [place], [place] [address]    Report Status PENDING  Incomplete  Respiratory (~20 pathogens) panel by PCR     Status: Abnormal   Collection Time: 06/22/21  5:39 PM   Specimen: Nasopharyngeal Swab; Respiratory  Result Value Ref Range Status   Adenovirus NOT DETECTED NOT DETECTED Final   Coronavirus 229E NOT DETECTED NOT DETECTED Final    Comment: (NOTE) The Coronavirus on the Respiratory Panel, DOES NOT test for the novel  Coronavirus (2019 nCoV)    Coronavirus HKU1 NOT DETECTED NOT DETECTED Final   Coronavirus NL63 NOT DETECTED NOT DETECTED Final   Coronavirus OC43 NOT DETECTED NOT DETECTED Final   Metapneumovirus NOT DETECTED NOT DETECTED Final    Rhinovirus / Enterovirus NOT DETECTED NOT DETECTED Final   Influenza A NOT DETECTED NOT DETECTED Final   Influenza B NOT DETECTED NOT DETECTED Final   Parainfluenza Virus 1 NOT DETECTED NOT DETECTED Final   Parainfluenza Virus 2 NOT DETECTED NOT DETECTED Final   Parainfluenza Virus 3 NOT DETECTED NOT DETECTED Final   Parainfluenza Virus 4 NOT DETECTED NOT DETECTED Final   Respiratory Syncytial Virus DETECTED (A) NOT DETECTED Final   Bordetella pertussis NOT DETECTED NOT DETECTED Final   Bordetella Parapertussis NOT DETECTED NOT DETECTED Final   Chlamydophila pneumoniae NOT DETECTED NOT DETECTED Final   Mycoplasma pneumoniae NOT DETECTED NOT DETECTED Final    Comment: Performed at [facility], [address], [place], [place] [address]  Radiology Studies: DG Chest 2 View  Result Date: 06/22/2021 CLINICAL DATA:  Dyspnea EXAM: CHEST - 2 VIEW COMPARISON:  03/18/2017 chest radiograph. FINDINGS: Chronic marked elevation of the left hemidiaphragm, unchanged. Stable cardiomediastinal silhouette with normal heart size. No pneumothorax. No pleural effusion. Left basilar passive atelectasis, chronic. No pulmonary edema. No acute consolidative airspace disease. IMPRESSION: Chronic marked elevation of the left hemidiaphragm with left basilar passive atelectasis. No acute cardiopulmonary disease. Electronically Signed   By: Delbert Phenix M.D.   On: 06/22/2021 17:13   CT Angio Chest PE W and/or Wo Contrast  Result Date: 06/22/2021 CLINICAL DATA:  Short of breath, cough, left pleural effusion, abnormal chest x-ray EXAM: CT ANGIOGRAPHY CHEST WITH CONTRAST TECHNIQUE: Multidetector CT imaging of the chest was performed using the standard protocol during bolus administration of intravenous contrast. Multiplanar CT image reconstructions and MIPs were obtained to evaluate the vascular anatomy. CONTRAST:  75mL OMNIPAQUE IOHEXOL 350 MG/ML SOLN COMPARISON:  06/22/2021, 08/17/2004 FINDINGS:  Cardiovascular: This is a technically adequate evaluation of the pulmonary vasculature. No filling defects or pulmonary emboli. The heart is unremarkable without pericardial effusion. Marked ectasia of the thoracic aorta without evidence of aneurysm or dissection. Mediastinum/Nodes: No enlarged mediastinal, hilar, or axillary lymph nodes. Thyroid gland, trachea, and esophagus demonstrate no significant findings. Lungs/Pleura: There is elevation of the left hemidiaphragm with compressive atelectasis involving the lingula and left lower lobe. This likely accounts for the chest x-ray finding. No acute airspace disease, effusion, or pneumothorax. Central airways are patent. Upper Abdomen: No acute abnormality. Musculoskeletal: No acute or destructive bony lesions. Reconstructed images demonstrate no additional findings. Review of the MIP images confirms the above findings. IMPRESSION: 1. No evidence of pulmonary embolus. 2. Elevated left hemidiaphragm, with compressive atelectasis at the left lung base. No acute airspace disease or effusion. Electronically Signed   By: Sharlet Salina M.D.   On: 06/22/2021 18:28   ECHOCARDIOGRAM COMPLETE  Result Date: 06/23/2021    ECHOCARDIOGRAM REPORT   Patient Name:   Morgan Abbott Date of Exam: 06/23/2021 Medical Rec #:  [id number]     Height:       63.0 in Accession #:    [id number]    Weight:       170.1 lb Date of Birth:  Sep 20, 1945     BSA:          1.805 m Patient Age:    75 years      BP:           119/71 mmHg Patient Gender: F             HR:           80 bpm. Exam Location:  [facility] Procedure: 2D Echo Indications:     Elevated Troponin  History:         Patient has no prior history of Echocardiogram examinations.  Sonographer:     Overton Mam RDCS Referring Phys:  [id number] [facility] Diagnosing Phys: Adrian Blackwater  Sonographer Comments: Suboptimal parasternal window and Technically difficult study due to poor echo windows. Image acquisition challenging due to respiratory  motion. IMPRESSIONS  1. Left ventricular ejection fraction, by estimation, is 55 to 60%. The left ventricle has normal function. The left ventricle has no regional wall motion abnormalities. There is mild concentric left ventricular hypertrophy. Left ventricular diastolic parameters are consistent with Grade I diastolic dysfunction (impaired relaxation).  2. Right ventricular systolic function is normal. The right ventricular size is normal.  3.  Left atrial size was mild to moderately dilated.  4. Right atrial size was mild to moderately dilated.  5. The mitral valve is normal in structure. No evidence of mitral valve regurgitation. No evidence of mitral stenosis.  6. The aortic valve is normal in structure. Aortic valve regurgitation is not visualized. No aortic stenosis is present.  7. The inferior vena cava is normal in size with greater than 50% respiratory variability, suggesting right atrial pressure of 3 mmHg. FINDINGS  Left Ventricle: Left ventricular ejection fraction, by estimation, is 55 to 60%. The left ventricle has normal function. The left ventricle has no regional wall motion abnormalities. The left ventricular internal cavity size was normal in size. There is  mild concentric left ventricular hypertrophy. Left ventricular diastolic parameters are consistent with Grade I diastolic dysfunction (impaired relaxation). Right Ventricle: The right ventricular size is normal. No increase in right ventricular wall thickness. Right ventricular systolic function is normal. Left Atrium: Left atrial size was mild to moderately dilated. Right Atrium: Right atrial size was mild to moderately dilated. Pericardium: There is no evidence of pericardial effusion. Mitral Valve: The mitral valve is normal in structure. No evidence of mitral valve regurgitation. No evidence of mitral valve stenosis. Tricuspid Valve: The tricuspid valve is normal in structure. Tricuspid valve regurgitation is trivial. No evidence of  tricuspid stenosis. Aortic Valve: The aortic valve is normal in structure. Aortic valve regurgitation is not visualized. No aortic stenosis is present. Aortic valve peak gradient measures 8.9 mmHg. Pulmonic Valve: The pulmonic valve was normal in structure. Pulmonic valve regurgitation is not visualized. No evidence of pulmonic stenosis. Aorta: The aortic root is normal in size and structure. Venous: The inferior vena cava is normal in size with greater than 50% respiratory variability, suggesting right atrial pressure of 3 mmHg. IAS/Shunts: No atrial level shunt detected by color flow Doppler.  LEFT VENTRICLE PLAX 2D LVIDd:         3.88 cm Diastology LVIDs:         2.71 cm LV e' medial:    7.51 cm/s LV PW:         1.12 cm LV E/e' medial:  11.1 LV IVS:        1.14 cm LV e' lateral:   8.59 cm/s                        LV E/e' lateral: 9.7  RIGHT VENTRICLE RV Basal diam:  3.80 cm TAPSE (M-mode): 1.9 cm LEFT ATRIUM             Index        RIGHT ATRIUM          Index LA Vol (A2C):   34.8 ml 19.28 ml/m  RA Area:     8.89 cm LA Vol (A4C):   17.9 ml 9.92 ml/m   RA Volume:   19.40 ml 10.75 ml/m LA Biplane Vol: 26.9 ml 14.90 ml/m  AORTIC VALVE AV Vmax:      149.00 cm/s AV Peak Grad: 8.9 mmHg LVOT Vmax:    140.00 cm/s LVOT Vmean:   90.400 cm/s LVOT VTI:     0.265 m MITRAL VALVE MV Area (PHT): 3.89 cm    SHUNTS MV Decel Time: 195 msec    Systemic VTI: 0.26 m MV E velocity: 83.60 cm/s MV A velocity: 82.70 cm/s MV E/A ratio:  1.01 Shaukat Khan Electronically signed by Adrian Blackwater Signature Date/Time: 06/23/2021/8:57:53 AM    Final  Scheduled Meds:  ipratropium-albuterol  3 mL Nebulization Q6H   oxyCODONE-acetaminophen  1 tablet Oral Q12H   And   oxyCODONE  5 mg Oral Q12H   pantoprazole  40 mg Oral Daily   sertraline  100 mg Oral Daily   Continuous Infusions:  azithromycin Stopped (06/22/21 1928)   cefTRIAXone (ROCEPHIN)  IV Stopped (06/22/21 1933)   heparin 850 Units/hr (06/22/21 1859)   lactated  ringers 75 mL/hr at 06/23/21 0913     LOS: 1 day    Time spent: 35 minutes    Tresa Moore, MD [facility]   If 7PM-7AM, please contact night-coverage  06/23/2021, 11:29 AM

## 2021-06-23 NOTE — Plan of Care (Signed)
  Problem: Education: Goal: Knowledge of General Education information will improve Description: Including pain rating scale, medication(s)/side effects and non-pharmacologic comfort measures 06/23/2021 1144 by Ansel Bong, RN Outcome: Progressing 06/23/2021 1143 by Ansel Bong, RN Outcome: Progressing   Problem: Health Behavior/Discharge Planning: Goal: Ability to manage health-related needs will improve 06/23/2021 1144 by Ansel Bong, RN Outcome: Progressing 06/23/2021 1143 by Ansel Bong, RN Outcome: Progressing   Problem: Clinical Measurements: Goal: Ability to maintain clinical measurements within normal limits will improve 06/23/2021 1144 by Ansel Bong, RN Outcome: Progressing 06/23/2021 1143 by Ansel Bong, RN Outcome: Progressing Goal: Will remain free from infection 06/23/2021 1144 by Ansel Bong, RN Outcome: Progressing 06/23/2021 1143 by Ansel Bong, RN Outcome: Progressing Goal: Diagnostic test results will improve 06/23/2021 1144 by Ansel Bong, RN Outcome: Progressing 06/23/2021 1143 by Ansel Bong, RN Outcome: Progressing Goal: Respiratory complications will improve 06/23/2021 1144 by Ansel Bong, RN Outcome: Progressing 06/23/2021 1143 by Ansel Bong, RN Outcome: Progressing Goal: Cardiovascular complication will be avoided 06/23/2021 1144 by Ansel Bong, RN Outcome: Progressing 06/23/2021 1143 by Ansel Bong, RN Outcome: Progressing   Problem: Activity: Goal: Risk for activity intolerance will decrease 06/23/2021 1144 by Ansel Bong, RN Outcome: Progressing 06/23/2021 1143 by Ansel Bong, RN Outcome: Progressing   Problem: Nutrition: Goal: Adequate nutrition will be maintained 06/23/2021 1144 by Ansel Bong, RN Outcome: Progressing 06/23/2021 1143 by Ansel Bong, RN Outcome: Progressing   Problem: Coping: Goal: Level of anxiety will decrease 06/23/2021 1144 by Ansel Bong, RN Outcome:  Progressing 06/23/2021 1143 by Ansel Bong, RN Outcome: Progressing   Problem: Elimination: Goal: Will not experience complications related to bowel motility 06/23/2021 1144 by Ansel Bong, RN Outcome: Progressing 06/23/2021 1143 by Ansel Bong, RN Outcome: Progressing Goal: Will not experience complications related to urinary retention 06/23/2021 1144 by Ansel Bong, RN Outcome: Progressing 06/23/2021 1143 by Ansel Bong, RN Outcome: Progressing   Problem: Pain Managment: Goal: General experience of comfort will improve 06/23/2021 1144 by Ansel Bong, RN Outcome: Progressing 06/23/2021 1143 by Ansel Bong, RN Outcome: Progressing   Problem: Safety: Goal: Ability to remain free from injury will improve 06/23/2021 1144 by Ansel Bong, RN Outcome: Progressing 06/23/2021 1143 by Ansel Bong, RN Outcome: Progressing   Problem: Skin Integrity: Goal: Risk for impaired skin integrity will decrease 06/23/2021 1144 by Ansel Bong, RN Outcome: Progressing 06/23/2021 1143 by Ansel Bong, RN Outcome: Progressing

## 2021-06-24 DIAGNOSIS — J9601 Acute respiratory failure with hypoxia: Secondary | ICD-10-CM | POA: Diagnosis not present

## 2021-06-24 LAB — URINE CULTURE: Culture: NO GROWTH

## 2021-06-24 MED ORDER — GUAIFENESIN-DM 100-10 MG/5ML PO SYRP: 5.0000 mL | ORAL_SOLUTION | ORAL | Status: DC | PRN

## 2021-06-24 MED ORDER — BENZONATATE 100 MG PO CAPS
200.0000 mg | ORAL_CAPSULE | Freq: Three times a day (TID) | ORAL | Status: DC
  Administered 2021-06-24 – 2021-06-26 (×7): 200 mg via ORAL
  Filled 2021-06-24 (×7): qty 2

## 2021-06-24 MED ORDER — DIAZEPAM 5 MG PO TABS: 5.0000 mg | ORAL_TABLET | Freq: Two times a day (BID) | ORAL | Status: DC | PRN

## 2021-06-24 MED ORDER — LOPERAMIDE HCL 2 MG PO CAPS
2.0000 mg | ORAL_CAPSULE | ORAL | Status: DC | PRN
  Administered 2021-06-24 (×2): 2 mg via ORAL
  Filled 2021-06-24 (×2): qty 1

## 2021-06-24 MED ORDER — ENOXAPARIN SODIUM 40 MG/0.4ML IJ SOSY
40.0000 mg | PREFILLED_SYRINGE | INTRAMUSCULAR | Status: DC
  Administered 2021-06-24 – 2021-06-25 (×2): 40 mg via SUBCUTANEOUS
  Filled 2021-06-24 (×2): qty 0.4

## 2021-06-24 MED ORDER — METHYLPREDNISOLONE SODIUM SUCC 40 MG IJ SOLR
40.0000 mg | Freq: Two times a day (BID) | INTRAMUSCULAR | Status: AC
Start: 2021-06-24 — End: 2021-06-25
  Administered 2021-06-24 – 2021-06-25 (×4): 40 mg via INTRAVENOUS
  Filled 2021-06-24 (×4): qty 1

## 2021-06-24 MED ORDER — SUMATRIPTAN SUCCINATE 50 MG PO TABS
50.0000 mg | ORAL_TABLET | ORAL | Status: DC | PRN
  Filled 2021-06-24 (×2): qty 1

## 2021-06-24 NOTE — Plan of Care (Signed)
  Problem: Education: Goal: Knowledge of General Education information will improve Description: Including pain rating scale, medication(s)/side effects and non-pharmacologic comfort measures 06/24/2021 1037 by Ansel Bong, RN Outcome: Progressing 06/24/2021 1037 by Ansel Bong, RN Outcome: Progressing   Problem: Health Behavior/Discharge Planning: Goal: Ability to manage health-related needs will improve 06/24/2021 1037 by Ansel Bong, RN Outcome: Progressing 06/24/2021 1037 by Ansel Bong, RN Outcome: Progressing   Problem: Clinical Measurements: Goal: Ability to maintain clinical measurements within normal limits will improve 06/24/2021 1037 by Ansel Bong, RN Outcome: Progressing 06/24/2021 1037 by Ansel Bong, RN Outcome: Progressing Goal: Will remain free from infection 06/24/2021 1037 by Ansel Bong, RN Outcome: Progressing 06/24/2021 1037 by Ansel Bong, RN Outcome: Progressing Goal: Diagnostic test results will improve 06/24/2021 1037 by Ansel Bong, RN Outcome: Progressing 06/24/2021 1037 by Ansel Bong, RN Outcome: Progressing Goal: Respiratory complications will improve 06/24/2021 1037 by Ansel Bong, RN Outcome: Progressing 06/24/2021 1037 by Ansel Bong, RN Outcome: Progressing Goal: Cardiovascular complication will be avoided 06/24/2021 1037 by Ansel Bong, RN Outcome: Progressing 06/24/2021 1037 by Ansel Bong, RN Outcome: Progressing   Problem: Activity: Goal: Risk for activity intolerance will decrease 06/24/2021 1037 by Ansel Bong, RN Outcome: Progressing 06/24/2021 1037 by Ansel Bong, RN Outcome: Progressing   Problem: Nutrition: Goal: Adequate nutrition will be maintained 06/24/2021 1037 by Ansel Bong, RN Outcome: Progressing 06/24/2021 1037 by Ansel Bong, RN Outcome: Progressing   Problem: Coping: Goal: Level of anxiety will decrease 06/24/2021 1037 by Ansel Bong, RN Outcome:  Progressing 06/24/2021 1037 by Ansel Bong, RN Outcome: Progressing   Problem: Elimination: Goal: Will not experience complications related to bowel motility 06/24/2021 1037 by Ansel Bong, RN Outcome: Progressing 06/24/2021 1037 by Ansel Bong, RN Outcome: Progressing Goal: Will not experience complications related to urinary retention 06/24/2021 1037 by Ansel Bong, RN Outcome: Progressing 06/24/2021 1037 by Ansel Bong, RN Outcome: Progressing   Problem: Pain Managment: Goal: General experience of comfort will improve 06/24/2021 1037 by Ansel Bong, RN Outcome: Progressing 06/24/2021 1037 by Ansel Bong, RN Outcome: Progressing   Problem: Safety: Goal: Ability to remain free from injury will improve 06/24/2021 1037 by Ansel Bong, RN Outcome: Progressing 06/24/2021 1037 by Ansel Bong, RN Outcome: Progressing   Problem: Skin Integrity: Goal: Risk for impaired skin integrity will decrease 06/24/2021 1037 by Ansel Bong, RN Outcome: Progressing 06/24/2021 1037 by Ansel Bong, RN Outcome: Progressing

## 2021-06-24 NOTE — Progress Notes (Signed)
PROGRESS NOTE    Morgan Abbott  ZOX:[id number] DOB: [birth year] DOA: 06/22/2021 PCP: Sallyanne Kuster, NP    Brief Narrative:  75 y.o. female with medical history significant for hx of remote colon cancer s/p colectomy asthma, GERD, anxiety depression, chronic pain on opioids who presents with concerns of cough and congestion.   History provided mostly by granddaughter at bedside since patient was lethargic and ill-appearing.  Patient lives with her granddaughter and works in Designer, industrial/product job.  About 2 days ago she was otherwise in her normal state of health and went to work but then that evening developed an acute wet cough with chest congestion.  Cough continue to get worse and she also experienced headache daily.  She then went to urgent care and had checks x-ray Dr. Charna Busman left-sided pleural effusion and presented here to the ED.   Follow-up chest CT with contrast did not demonstrate any effusion or consolidation.  Respiratory viral panel positive for respiratory syncytial virus.   Assessment & Plan:   Principal Problem:   Acute respiratory failure with hypoxia (HCC) Active Problems:   Generalized anxiety disorder   Elevated troponin   Chronic pain   Depression   Asthma, chronic, unspecified asthma severity, with acute exacerbation  Respiratory syncytial virus infection Acute hypoxic respiratory failure CTA chest unrevealing for pneumonia Wheezing on exam RVP positive for RSV Procalcitonin negative Antibiotics stopped Plan: Trial of steroids IV fluids Bronchodilators Antitussives Wean oxygen as tolerated  Headache Likely in the setting of coughing and viral infection Fioricet ineffective Sumatriptan 50 mg p.o. every 2 hours as needed  Elevated troponin Unlikely to represent ACS NSTEMI essentially ruled out Troponins peaked, now downtrending Plan: Continue empiric and for GGT for 24 hours total, then discontinue Telemetry monitoring for 24 hours, then  discontinue  Chronic pain Continue home narcotic regimen  GERD PPI  Depression Zoloft    DVT prophylaxis: SQ Lovenox Code Status: Full Family Communication: Morgan Abbott [phone] on 10/8, family member at bedside 10/9 Disposition Plan: Status is: Inpatient  Remains inpatient appropriate because:Inpatient level of care appropriate due to severity of illness  Dispo: The patient is from: Home              Anticipated d/c is to: Home              Patient currently is not medically stable to d/c.   Difficult to place patient No       Level of care: Progressive Cardiac  Consultants:  None  Procedures:  None  Antimicrobials:   Subjective: Patient seen and examined.  Endorses headache and cough.  Objective: Vitals:   06/24/21 0720 06/24/21 0811 06/24/21 1000 06/24/21 1034  BP:  (!) 157/74    Pulse:  94    Resp:  17    Temp:  98.3 F (36.8 C)    TempSrc:      SpO2: 97% 90% (!) 86% 92%  Weight:      Height:        Intake/Output Summary (Last 24 hours) at 06/24/2021 1208 Last data filed at 06/24/2021 0245 Gross per 24 hour  Intake 1150.97 ml  Output 1100 ml  Net 50.97 ml   Filed Weights   06/22/21 1628 06/22/21 2047 06/23/21 0138  Weight: 75.8 kg 75.8 kg 77.2 kg    Examination:  General exam: No acute distress.  Fatigued Respiratory system: Scattered crackles.  Normal work of breathing.  Room air Cardiovascular system: S1-S2, RRR, no murmurs, no pedal edema Gastrointestinal  system: Soft, NT/ND, normal bowel sounds Central nervous system: Alert and oriented. No focal neurological deficits. Extremities: Symmetric 5 x 5 power. Skin: No rashes, lesions or ulcers Psychiatry: Judgement and insight appear normal. Mood & affect appropriate.     Data Reviewed: I have personally reviewed following labs and imaging studies  CBC: Recent Labs  Lab 06/22/21 1635 06/23/21 0251  WBC 7.8 10.0  HGB 12.5 11.3*  HCT 36.3 33.1*  MCV 90.8 93.0   PLT 183 138*   Basic Metabolic Panel: Recent Labs  Lab 06/22/21 1635 06/23/21 0251  NA 133* 132*  K 4.4 4.8  CL 97* 97*  CO2 25 28  GLUCOSE 121* 122*  BUN 21 16  CREATININE 0.99 0.82  CALCIUM 8.8* 8.1*   GFR: Estimated Creatinine Clearance: 59.2 mL/min (by C-G formula based on SCr of 0.82 mg/dL). Liver Function Tests: No results for input(s): AST, ALT, ALKPHOS, BILITOT, PROT, ALBUMIN in the last 168 hours. No results for input(s): LIPASE, AMYLASE in the last 168 hours. No results for input(s): AMMONIA in the last 168 hours. Coagulation Profile: Recent Labs  Lab 06/22/21 1636  INR 1.1   Cardiac Enzymes: No results for input(s): CKTOTAL, CKMB, CKMBINDEX, TROPONINI in the last 168 hours. BNP (last 3 results) No results for input(s): PROBNP in the last 8760 hours. HbA1C: No results for input(s): HGBA1C in the last 72 hours. CBG: No results for input(s): GLUCAP in the last 168 hours. Lipid Profile: No results for input(s): CHOL, HDL, LDLCALC, TRIG, CHOLHDL, LDLDIRECT in the last 72 hours. Thyroid Function Tests: No results for input(s): TSH, T4TOTAL, FREET4, T3FREE, THYROIDAB in the last 72 hours. Anemia Panel: No results for input(s): VITAMINB12, FOLATE, FERRITIN, TIBC, IRON, RETICCTPCT in the last 72 hours. Sepsis Labs: Recent Labs  Lab 06/22/21 1635 06/22/21 1739 06/23/21 0740  PROCALCITON  --   --  <0.10  LATICACIDVEN 1.3 1.1  --     Recent Results (from the past 240 hour(s))  Resp Panel by RT-PCR (Flu A&B, Covid) Nasopharyngeal Swab     Status: None   Collection Time: 06/22/21  5:39 PM   Specimen: Nasopharyngeal Swab; Nasopharyngeal(NP) swabs in vial transport medium  Result Value Ref Range Status   SARS Coronavirus 2 by RT PCR NEGATIVE NEGATIVE Final    Comment: (NOTE) SARS-CoV-2 target nucleic acids are NOT DETECTED.  The SARS-CoV-2 RNA is generally detectable in upper respiratory specimens during the acute phase of infection. The  lowest concentration of SARS-CoV-2 viral copies this assay can detect is 138 copies/mL. A negative result does not preclude SARS-Cov-2 infection and should not be used as the sole basis for treatment or other patient management decisions. A negative result may occur with  improper specimen collection/handling, submission of specimen other than nasopharyngeal swab, presence of viral mutation(s) within the areas targeted by this assay, and inadequate number of viral copies(<138 copies/mL). A negative result must be combined with clinical observations, patient history, and epidemiological information. The expected result is Negative.  Fact Sheet for Patients:  BloggerCourse.com  Fact Sheet for Healthcare Providers:  SeriousBroker.it  This test is no t yet approved or cleared by the [place] FDA and  has been authorized for detection and/or diagnosis of SARS-CoV-2 by FDA under an Emergency Use Authorization (EUA). This EUA will remain  in effect (meaning this test can be used) for the duration of the COVID-19 declaration under Section 564(b)(1) of the Act, 21 U.S.C.section 360bbb-3(b)(1), unless the authorization is terminated  or revoked sooner.  Influenza A by PCR NEGATIVE NEGATIVE Final   Influenza B by PCR NEGATIVE NEGATIVE Final    Comment: (NOTE) The Xpert Xpress SARS-CoV-2/FLU/RSV plus assay is intended as an aid in the diagnosis of influenza from Nasopharyngeal swab specimens and should not be used as a sole basis for treatment. Nasal washings and aspirates are unacceptable for Xpert Xpress SARS-CoV-2/FLU/RSV testing.  Fact Sheet for Patients: BloggerCourse.com  Fact Sheet for Healthcare Providers: SeriousBroker.it  This test is not yet approved or cleared by the [place] FDA and has been authorized for detection and/or diagnosis of SARS-CoV-2 by FDA under  an Emergency Use Authorization (EUA). This EUA will remain in effect (meaning this test can be used) for the duration of the COVID-19 declaration under Section 564(b)(1) of the Act, 21 U.S.C. section 360bbb-3(b)(1), unless the authorization is terminated or revoked.  Performed at [facility], [address] [place], [place] [address]   Blood Culture (routine x 2)     Status: None (Preliminary result)   Collection Time: 06/22/21  5:39 PM   Specimen: BLOOD  Result Value Ref Range Status   Specimen Description BLOOD BLOOD RIGHT FOREARM  Final   Special Requests   Final    BOTTLES DRAWN AEROBIC AND ANAEROBIC Blood Culture results may not be optimal due to an inadequate volume of blood received in culture bottles   Culture   Final    NO GROWTH 2 DAYS Performed at [facility], [address], [place], [place] [address]    Report Status PENDING  Incomplete  Blood Culture (routine x 2)     Status: None (Preliminary result)   Collection Time: 06/22/21  5:39 PM   Specimen: BLOOD  Result Value Ref Range Status   Specimen Description BLOOD LEFT ANTECUBITAL  Final   Special Requests   Final    BOTTLES DRAWN AEROBIC AND ANAEROBIC Blood Culture adequate volume   Culture   Final    NO GROWTH 2 DAYS Performed at [facility], [address], [place], [place] [address]    Report Status PENDING  Incomplete  Urine Culture     Status: None   Collection Time: 06/22/21  5:39 PM   Specimen: In/Out Cath Urine  Result Value Ref Range Status   Specimen Description   Final    IN/OUT CATH URINE Performed at [facility], [address] [place], [place] [address]    Special Requests   Final    NONE Performed at [facility], [address] [place], [place] [address]    Culture   Final    NO GROWTH Performed at [facility], [address], [place], [place] [address]    Report Status 06/24/2021 FINAL  Final  Respiratory (~20 pathogens)  panel by PCR     Status: Abnormal   Collection Time: 06/22/21  5:39 PM   Specimen: Nasopharyngeal Swab; Respiratory  Result Value Ref Range Status   Adenovirus NOT DETECTED NOT DETECTED Final   Coronavirus 229E NOT DETECTED NOT DETECTED Final    Comment: (NOTE) The Coronavirus on the Respiratory Panel, DOES NOT test for the novel  Coronavirus (2019 nCoV)    Coronavirus HKU1 NOT DETECTED NOT DETECTED Final   Coronavirus NL63 NOT DETECTED NOT DETECTED Final   Coronavirus OC43 NOT DETECTED NOT DETECTED Final   Metapneumovirus NOT DETECTED NOT DETECTED Final   Rhinovirus / Enterovirus NOT DETECTED NOT DETECTED Final   Influenza A NOT DETECTED NOT DETECTED Final  Influenza B NOT DETECTED NOT DETECTED Final   Parainfluenza Virus 1 NOT DETECTED NOT DETECTED Final   Parainfluenza Virus 2 NOT DETECTED NOT DETECTED Final   Parainfluenza Virus 3 NOT DETECTED NOT DETECTED Final   Parainfluenza Virus 4 NOT DETECTED NOT DETECTED Final   Respiratory Syncytial Virus DETECTED (A) NOT DETECTED Final   Bordetella pertussis NOT DETECTED NOT DETECTED Final   Bordetella Parapertussis NOT DETECTED NOT DETECTED Final   Chlamydophila pneumoniae NOT DETECTED NOT DETECTED Final   Mycoplasma pneumoniae NOT DETECTED NOT DETECTED Final    Comment: Performed at [facility], [address], [place], [place] [address]         Radiology Studies: DG Chest 2 View  Result Date: 06/22/2021 CLINICAL DATA:  Dyspnea EXAM: CHEST - 2 VIEW COMPARISON:  03/18/2017 chest radiograph. FINDINGS: Chronic marked elevation of the left hemidiaphragm, unchanged. Stable cardiomediastinal silhouette with normal heart size. No pneumothorax. No pleural effusion. Left basilar passive atelectasis, chronic. No pulmonary edema. No acute consolidative airspace disease. IMPRESSION: Chronic marked elevation of the left hemidiaphragm with left basilar passive atelectasis. No acute cardiopulmonary disease. Electronically Signed   By:  Delbert Phenix M.D.   On: 06/22/2021 17:13   CT Angio Chest PE W and/or Wo Contrast  Result Date: 06/22/2021 CLINICAL DATA:  Short of breath, cough, left pleural effusion, abnormal chest x-ray EXAM: CT ANGIOGRAPHY CHEST WITH CONTRAST TECHNIQUE: Multidetector CT imaging of the chest was performed using the standard protocol during bolus administration of intravenous contrast. Multiplanar CT image reconstructions and MIPs were obtained to evaluate the vascular anatomy. CONTRAST:  8mL OMNIPAQUE IOHEXOL 350 MG/ML SOLN COMPARISON:  06/22/2021, 08/17/2004 FINDINGS: Cardiovascular: This is a technically adequate evaluation of the pulmonary vasculature. No filling defects or pulmonary emboli. The heart is unremarkable without pericardial effusion. Marked ectasia of the thoracic aorta without evidence of aneurysm or dissection. Mediastinum/Nodes: No enlarged mediastinal, hilar, or axillary lymph nodes. Thyroid gland, trachea, and esophagus demonstrate no significant findings. Lungs/Pleura: There is elevation of the left hemidiaphragm with compressive atelectasis involving the lingula and left lower lobe. This likely accounts for the chest x-ray finding. No acute airspace disease, effusion, or pneumothorax. Central airways are patent. Upper Abdomen: No acute abnormality. Musculoskeletal: No acute or destructive bony lesions. Reconstructed images demonstrate no additional findings. Review of the MIP images confirms the above findings. IMPRESSION: 1. No evidence of pulmonary embolus. 2. Elevated left hemidiaphragm, with compressive atelectasis at the left lung base. No acute airspace disease or effusion. Electronically Signed   By: Sharlet Salina M.D.   On: 06/22/2021 18:28   ECHOCARDIOGRAM COMPLETE  Result Date: 06/23/2021    ECHOCARDIOGRAM REPORT   Patient Name:   Morgan Abbott Date of Exam: 06/23/2021 Medical Rec #:  [id number]     Height:       63.0 in Accession #:    [id number]    Weight:       170.1 lb Date of Birth:   1945-10-18     BSA:          1.805 m Patient Age:    75 years      BP:           119/71 mmHg Patient Gender: F             HR:           80 bpm. Exam Location:  [facility] Procedure: 2D Echo Indications:     Elevated Troponin  History:  Patient has no prior history of Echocardiogram examinations.  Sonographer:     Overton Mam RDCS Referring Phys:  [id number] [facility] Diagnosing Phys: Adrian Blackwater  Sonographer Comments: Suboptimal parasternal window and Technically difficult study due to poor echo windows. Image acquisition challenging due to respiratory motion. IMPRESSIONS  1. Left ventricular ejection fraction, by estimation, is 55 to 60%. The left ventricle has normal function. The left ventricle has no regional wall motion abnormalities. There is mild concentric left ventricular hypertrophy. Left ventricular diastolic parameters are consistent with Grade I diastolic dysfunction (impaired relaxation).  2. Right ventricular systolic function is normal. The right ventricular size is normal.  3. Left atrial size was mild to moderately dilated.  4. Right atrial size was mild to moderately dilated.  5. The mitral valve is normal in structure. No evidence of mitral valve regurgitation. No evidence of mitral stenosis.  6. The aortic valve is normal in structure. Aortic valve regurgitation is not visualized. No aortic stenosis is present.  7. The inferior vena cava is normal in size with greater than 50% respiratory variability, suggesting right atrial pressure of 3 mmHg. FINDINGS  Left Ventricle: Left ventricular ejection fraction, by estimation, is 55 to 60%. The left ventricle has normal function. The left ventricle has no regional wall motion abnormalities. The left ventricular internal cavity size was normal in size. There is  mild concentric left ventricular hypertrophy. Left ventricular diastolic parameters are consistent with Grade I diastolic dysfunction (impaired relaxation). Right Ventricle: The right  ventricular size is normal. No increase in right ventricular wall thickness. Right ventricular systolic function is normal. Left Atrium: Left atrial size was mild to moderately dilated. Right Atrium: Right atrial size was mild to moderately dilated. Pericardium: There is no evidence of pericardial effusion. Mitral Valve: The mitral valve is normal in structure. No evidence of mitral valve regurgitation. No evidence of mitral valve stenosis. Tricuspid Valve: The tricuspid valve is normal in structure. Tricuspid valve regurgitation is trivial. No evidence of tricuspid stenosis. Aortic Valve: The aortic valve is normal in structure. Aortic valve regurgitation is not visualized. No aortic stenosis is present. Aortic valve peak gradient measures 8.9 mmHg. Pulmonic Valve: The pulmonic valve was normal in structure. Pulmonic valve regurgitation is not visualized. No evidence of pulmonic stenosis. Aorta: The aortic root is normal in size and structure. Venous: The inferior vena cava is normal in size with greater than 50% respiratory variability, suggesting right atrial pressure of 3 mmHg. IAS/Shunts: No atrial level shunt detected by color flow Doppler.  LEFT VENTRICLE PLAX 2D LVIDd:         3.88 cm Diastology LVIDs:         2.71 cm LV e' medial:    7.51 cm/s LV PW:         1.12 cm LV E/e' medial:  11.1 LV IVS:        1.14 cm LV e' lateral:   8.59 cm/s                        LV E/e' lateral: 9.7  RIGHT VENTRICLE RV Basal diam:  3.80 cm TAPSE (M-mode): 1.9 cm LEFT ATRIUM             Index        RIGHT ATRIUM          Index LA Vol (A2C):   34.8 ml 19.28 ml/m  RA Area:     8.89 cm LA Vol (A4C):  17.9 ml 9.92 ml/m   RA Volume:   19.40 ml 10.75 ml/m LA Biplane Vol: 26.9 ml 14.90 ml/m  AORTIC VALVE AV Vmax:      149.00 cm/s AV Peak Grad: 8.9 mmHg LVOT Vmax:    140.00 cm/s LVOT Vmean:   90.400 cm/s LVOT VTI:     0.265 m MITRAL VALVE MV Area (PHT): 3.89 cm    SHUNTS MV Decel Time: 195 msec    Systemic VTI: 0.26 m MV E  velocity: 83.60 cm/s MV A velocity: 82.70 cm/s MV E/A ratio:  1.01 Shaukat Khan Electronically signed by Adrian Blackwater Signature Date/Time: 06/23/2021/8:57:53 AM    Final         Scheduled Meds:  benzonatate  200 mg Oral TID   ipratropium-albuterol  3 mL Nebulization Q6H   methylPREDNISolone (SOLU-MEDROL) injection  40 mg Intravenous Q12H   oxyCODONE-acetaminophen  1 tablet Oral Q12H   And   oxyCODONE  5 mg Oral Q12H   pantoprazole  40 mg Oral Daily   sertraline  100 mg Oral Daily   Continuous Infusions:     LOS: 2 days    Time spent: 25 minutes    Tresa Moore, MD [facility]   If 7PM-7AM, please contact night-coverage  06/24/2021, 12:08 PM

## 2021-06-25 DIAGNOSIS — J9601 Acute respiratory failure with hypoxia: Secondary | ICD-10-CM | POA: Diagnosis not present

## 2021-06-25 LAB — BASIC METABOLIC PANEL
Anion gap: 7 (ref 5–15)
BUN: 13 mg/dL (ref 8–23)
CO2: 32 mmol/L (ref 22–32)
Calcium: 9 mg/dL (ref 8.9–10.3)
Chloride: 100 mmol/L (ref 98–111)
Creatinine, Ser: 0.8 mg/dL (ref 0.44–1.00)
GFR, Estimated: >60 mL/min (ref >60)
Glucose, Bld: 141 mg/dL — ABNORMAL HIGH (ref 70–99)
Potassium: 3.7 mmol/L (ref 3.5–5.1)
Sodium: 139 mmol/L (ref 135–145)

## 2021-06-25 LAB — CBC WITH DIFFERENTIAL/PLATELET
Abs Immature Granulocytes: 0.05 10*3/uL (ref 0.00–0.07)
Basophils Absolute: 0 10*3/uL (ref 0.0–0.1)
Basophils Relative: 0 %
Eosinophils Absolute: 0 10*3/uL (ref 0.0–0.5)
Eosinophils Relative: 0 %
HCT: 33.1 % — ABNORMAL LOW (ref 36.0–46.0)
Hemoglobin: 11.2 g/dL — ABNORMAL LOW (ref 12.0–15.0)
Immature Granulocytes: 1 %
Lymphocytes Relative: 5 %
Lymphs Abs: 0.4 10*3/uL — ABNORMAL LOW (ref 0.7–4.0)
MCH: 29.8 pg (ref 26.0–34.0)
MCHC: 33.8 g/dL (ref 30.0–36.0)
MCV: 88 fL (ref 80.0–100.0)
Monocytes Absolute: 0.3 10*3/uL (ref 0.1–1.0)
Monocytes Relative: 3 %
Neutro Abs: 7.7 10*3/uL (ref 1.7–7.7)
Neutrophils Relative %: 91 %
Platelets: 144 10*3/uL — ABNORMAL LOW (ref 150–400)
RBC: 3.76 MIL/uL — ABNORMAL LOW (ref 3.87–5.11)
RDW: 12.6 % (ref 11.5–15.5)
WBC: 8.4 10*3/uL (ref 4.0–10.5)
nRBC: 0 % (ref 0.0–0.2)

## 2021-06-25 NOTE — Progress Notes (Signed)
Received call from lab re: pt's sodium level going up significantly. They will do a re-check. Dr. Georgeann Oppenheim notified.

## 2021-06-25 NOTE — Evaluation (Signed)
Physical Therapy Evaluation Patient Details Name: VERNECIA UMBLE MRN: [id number] DOB: June 27, 1946 Today's Date: 06/25/2021  History of Present Illness  75 y.o. female with medical history significant for hx of remote colon cancer s/p colectomy asthma, GERD, anxiety depression, chronic pain on opioids who presents with concerns of cough and congestion. Per MD, "Chest x-ray and CTA chest was unrevealing for pneumonia but patient clearly ill-appearing with wheezing on lung exam." Follow-up chest CT with contrast did not demonstrate any effusion or consolidation.  Respiratory viral panel positive for respiratory syncytial virus.   Clinical Impression  Pt is a pleasant 75 year old female who presents to PT evaluation after admission for respiratory syncytial virus. Prior to admission, pt reported that she intermittently used a SPC for household mobility distances and requires intermittent assistance with IADLs from her granddaughter. Currently, pt is mod I for bed mobility, SPV/minA for transfers, and SBA for ambulation of 160 ft with usage of RW. Trialed short distance ambulation without RW and pt reaching out to touch objects. Oxygen monitored throughout ambulation and remained at 90% on room air throughout. Recommending home with home health PT at this time due to decreased activity tolerance and generalized weakness due to hospitalization. Will continue to work with patient during hospitalization to improve strength, balance, and endurance.     Recommendations for follow up therapy are one component of a multi-disciplinary discharge planning process, led by the attending physician.  Recommendations may be updated based on patient status, additional functional criteria and insurance authorization.  Follow Up Recommendations Home health PT;Supervision - Intermittent    Equipment Recommendations  Rolling walker with 5" wheels    Recommendations for Other Services       Precautions / Restrictions  Restrictions Weight Bearing Restrictions: No      Mobility  Bed Mobility Overal bed mobility: Needs Assistance Bed Mobility: Supine to Sit;Sit to Supine     Supine to sit: Modified independent (Device/Increase time) Sit to supine: Modified independent (Device/Increase time)   General bed mobility comments: Additional time to complete but no physical assistance required    Transfers Overall transfer level: Needs assistance Equipment used: Rolling walker (2 wheeled) Transfers: Sit to/from Stand Sit to Stand: Supervision         General transfer comment: SPV from higher surface (ie. BSC) and minA from low bed surface with cues for hand placment to assist to standing  Ambulation/Gait Ambulation/Gait assistance: Supervision Gait Distance (Feet): 160 Feet Assistive device: Rolling walker (2 wheeled) Gait Pattern/deviations: Step-through pattern;Decreased stride length;Trunk flexed     General Gait Details: Step through pattern throughout gait training. Oxygen monitored >90% throughout, on 1L oxygen pt >95% during ambulation  Stairs            Wheelchair Mobility    Modified Rankin (Stroke Patients Only)       Balance Overall balance assessment: Needs assistance Sitting-balance support: Single extremity supported Sitting balance-Leahy Scale: Fair Sitting balance - Comments: 1 UE to brace in sitting with dynamic sitting balance   Standing balance support: No upper extremity supported;During functional activity Standing balance-Leahy Scale: Fair Standing balance comment: Able to take a few steps without UE support however, reaching out to touch objects. Improved balance with B UE support on RW                             Pertinent Vitals/Pain Pain Assessment: Faces Faces Pain Scale: Hurts a little bit Pain Location:  no location given just reports arthritis Pain Intervention(s): Limited activity within patient's tolerance;Monitored during  session;Utilized relaxation techniques;Repositioned    Home Living Family/patient expects to be discharged to:: Private residence Living Arrangements: Other (Comment) (Granddaughter) Available Help at Discharge: Family;Available 24 hours/day;Available PRN/intermittently Type of Home: Apartment Home Access: Stairs to enter Entrance Stairs-Rails: Can reach both;Right;Left Entrance Stairs-Number of Steps: 5 Home Layout: Two level;Able to live on main level with bedroom/bathroom;Full bath on main level Home Equipment: Cane - single point;Walker - 4 wheels Additional Comments: Pt reports that her daughter lives with her in their two story apartment and that she may be able to provide assistance as needed.    Prior Function Level of Independence: Independent;Needs assistance   Gait / Transfers Assistance Needed: Independent for household mobility  ADL's / Homemaking Assistance Needed: Independent with dressing and bathing; assistance with meal preparation from granddaughter  Comments: Pt reports that she was mainly a household ambulator due to chronic pain/arthritis     Hand Dominance        Extremity/Trunk Assessment   Upper Extremity Assessment Upper Extremity Assessment: Overall WFL for tasks assessed    Lower Extremity Assessment Lower Extremity Assessment: Overall WFL for tasks assessed;Generalized weakness;RLE deficits/detail;LLE deficits/detail RLE Deficits / Details: 3/5 MMT hip flexion, 4/5 MMT for R knee flexion and plantar flexion RLE Sensation: WNL LLE Deficits / Details: 3/5 MMT hip flexion, 4/5 MMT knee flexion and plantar flexion LLE Sensation: WNL    Cervical / Trunk Assessment Cervical / Trunk Assessment: Kyphotic (Increased thoracic kyphosis and increased forward head posture)  Communication   Communication: No difficulties  Cognition Arousal/Alertness: Awake/alert Behavior During Therapy: Flat affect Overall Cognitive Status: No family/caregiver present to  determine baseline cognitive functioning                                 General Comments: Pt slightly disoriented to time and seemed lethargic/flat affect throughout evaluation      General Comments      Exercises     Assessment/Plan    PT Assessment Patient needs continued PT services  PT Problem List Decreased strength;Decreased activity tolerance;Decreased balance;Decreased mobility;Decreased knowledge of use of DME       PT Treatment Interventions Gait training;DME instruction;Stair training;Functional mobility training;Therapeutic activities;Therapeutic exercise;Neuromuscular re-education;Balance training;Patient/family education;Modalities;Manual techniques    PT Goals (Current goals can be found in the Care Plan section)  Acute Rehab PT Goals Patient Stated Goal: to feel better PT Goal Formulation: With patient Time For Goal Achievement: 07/09/21 Potential to Achieve Goals: Good    Frequency Min 2X/week   Barriers to discharge        Co-evaluation               AM-PAC PT "6 Clicks" Mobility  Outcome Measure Help needed turning from your back to your side while in a flat bed without using bedrails?: None Help needed moving from lying on your back to sitting on the side of a flat bed without using bedrails?: None Help needed moving to and from a bed to a chair (including a wheelchair)?: None Help needed standing up from a chair using your arms (e.g., wheelchair or bedside chair)?: None Help needed to walk in hospital room?: A Little Help needed climbing 3-5 steps with a railing? : A Lot 6 Click Score: 21    End of Session Equipment Utilized During Treatment: Gait belt;Oxygen Activity Tolerance: Patient tolerated treatment well;Patient  limited by fatigue Patient left: in bed;with call bell/phone within reach Nurse Communication: Mobility status PT Visit Diagnosis: Unsteadiness on feet (R26.81);Muscle weakness (generalized) (M62.81)     Time: 6384-6659 PT Time Calculation (min) (ACUTE ONLY): 30 min   Charges:   PT Evaluation $PT Eval Low Complexity: 1 Low PT Treatments $Gait Training: 8-22 mins        Verl Blalock, SPT   Verl Blalock 06/25/2021, 3:10 PM

## 2021-06-25 NOTE — Progress Notes (Signed)
PROGRESS NOTE    Morgan Abbott  WEX:[id number] DOB: 19-Jan-1946 DOA: 06/22/2021 PCP: Sallyanne Kuster, NP    Brief Narrative:  75 y.o. female with medical history significant for hx of remote colon cancer s/p colectomy asthma, GERD, anxiety depression, chronic pain on opioids who presents with concerns of cough and congestion.   History provided mostly by granddaughter at bedside since patient was lethargic and ill-appearing.  Patient lives with her granddaughter and works in Design[employer], industrial/product job.  About 2 days ago she was otherwise in her normal state of health and went to work but then that evening developed an acute wet cough with chest congestion.  Cough continue to get worse and she also experienced headache daily.  She then went to urgent care and had checks x-ray Dr. Charna Busman left-sided pleural effusion and presented here to the ED.    Follow-up chest CT with contrast did not demonstrate any effusion or consolidation.  Respiratory viral panel positive for respiratory syncytial virus.   Assessment & Plan:   Principal Problem:   Acute respiratory failure with hypoxia (HCC) Active Problems:   Generalized anxiety disorder   Elevated troponin   Chronic pain   Depression   Asthma, chronic, unspecified asthma severity, with acute exacerbation  Respiratory syncytial virus infection Acute hypoxic respiratory failure CTA chest unrevealing for pneumonia Wheezing on exam RVP positive for RSV Procalcitonin negative Antibiotics stopped Plan: IV Solu-Medrol, last dose today No further IV fluids, encourage p.o. fluid intake Bronchodilators Antitussives Wean oxygen as tolerated, patient on room air as of 10/10 If patient continues to improve anticipate discharge 10/11   Headache Likely in the setting of coughing and viral infection Fioricet ineffective Sumatriptan 50 mg p.o. every 2 hours as needed Was effective   Elevated troponin Unlikely to represent ACS NSTEMI essentially ruled  out Troponins peaked, now downtrending Plan: Heparin stopped.  ACS ruled out   Chronic pain Continue home narcotic regimen   GERD PPI   Depression Zoloft   DVT prophylaxis: SQ Lovenox Code Status: Full Family Communication: Family member at bedside 10/10 Disposition Plan: Status is: Inpatient  Remains inpatient appropriate because:Inpatient level of care appropriate due to severity of illness  Dispo: The patient is from: Home              Anticipated d/c is to: Home              Patient currently is not medically stable to d/c.   Difficult to place patient No       Level of care: Progressive Cardiac  Consultants:  None  Procedures:  None  Antimicrobials: None   Subjective: Seen and examined.  Reports improvement in symptoms.  No pain complaints  Objective: Vitals:   06/25/21 0157 06/25/21 0440 06/25/21 0757 06/25/21 0759  BP:  (!) 106/54  (!) 154/80  Pulse:  72 82 83  Resp:  20 16 18   Temp:  98.4 F (36.9 C)  98.3 F (36.8 C)  TempSrc:      SpO2: 97% 95% 91% 91%  Weight:      Height:       No intake or output data in the 24 hours ending 06/25/21 1042 Filed Weights   06/22/21 1628 06/22/21 2047 06/23/21 0138  Weight: 75.8 kg 75.8 kg 77.2 kg    Examination:  General exam: Appears calm and comfortable  Respiratory system: Mild bibasilar crackles.  Normal work of breathing.  Room air Cardiovascular system: S1-S2, RRR, no murmurs, no pedal edema Gastrointestinal  system: Soft, NT/ND, normal bowel sounds Central nervous system: Alert and oriented. No focal neurological deficits. Extremities: Symmetric 5 x 5 power. Skin: No rashes, lesions or ulcers Psychiatry: Judgement and insight appear normal. Mood & affect appropriate.     Data Reviewed: I have personally reviewed following labs and imaging studies  CBC: Recent Labs  Lab 06/22/21 1635 06/23/21 0251 06/25/21 0746  WBC 7.8 10.0 8.4  NEUTROABS  --   --  7.7  HGB 12.5 11.3* 11.2*   HCT 36.3 33.1* 33.1*  MCV 90.8 93.0 88.0  PLT 183 138* 144*   Basic Metabolic Panel: Recent Labs  Lab 06/22/21 1635 06/23/21 0251 06/25/21 0746  NA 133* 132* 139  K 4.4 4.8 3.7  CL 97* 97* 100  CO2 25 28 32  GLUCOSE 121* 122* 141*  BUN 21 16 13   CREATININE 0.99 0.82 0.80  CALCIUM 8.8* 8.1* 9.0   GFR: Estimated Creatinine Clearance: 60.7 mL/min (by C-G formula based on SCr of 0.8 mg/dL). Liver Function Tests: No results for input(s): AST, ALT, ALKPHOS, BILITOT, PROT, ALBUMIN in the last 168 hours. No results for input(s): LIPASE, AMYLASE in the last 168 hours. No results for input(s): AMMONIA in the last 168 hours. Coagulation Profile: Recent Labs  Lab 06/22/21 1636  INR 1.1   Cardiac Enzymes: No results for input(s): CKTOTAL, CKMB, CKMBINDEX, TROPONINI in the last 168 hours. BNP (last 3 results) No results for input(s): PROBNP in the last 8760 hours. HbA1C: No results for input(s): HGBA1C in the last 72 hours. CBG: No results for input(s): GLUCAP in the last 168 hours. Lipid Profile: No results for input(s): CHOL, HDL, LDLCALC, TRIG, CHOLHDL, LDLDIRECT in the last 72 hours. Thyroid Function Tests: No results for input(s): TSH, T4TOTAL, FREET4, T3FREE, THYROIDAB in the last 72 hours. Anemia Panel: No results for input(s): VITAMINB12, FOLATE, FERRITIN, TIBC, IRON, RETICCTPCT in the last 72 hours. Sepsis Labs: Recent Labs  Lab 06/22/21 1635 06/22/21 1739 06/23/21 0740  PROCALCITON  --   --  <0.10  LATICACIDVEN 1.3 1.1  --     Recent Results (from the past 240 hour(s))  Resp Panel by RT-PCR (Flu A&B, Covid) Nasopharyngeal Swab     Status: None   Collection Time: 06/22/21  5:39 PM   Specimen: Nasopharyngeal Swab; Nasopharyngeal(NP) swabs in vial transport medium  Result Value Ref Range Status   SARS Coronavirus 2 by RT PCR NEGATIVE NEGATIVE Final    Comment: (NOTE) SARS-CoV-2 target nucleic acids are NOT DETECTED.  The SARS-CoV-2 RNA is generally  detectable in upper respiratory specimens during the acute phase of infection. The lowest concentration of SARS-CoV-2 viral copies this assay can detect is 138 copies/mL. A negative result does not preclude SARS-Cov-2 infection and should not be used as the sole basis for treatment or other patient management decisions. A negative result may occur with  improper specimen collection/handling, submission of specimen other than nasopharyngeal swab, presence of viral mutation(s) within the areas targeted by this assay, and inadequate number of viral copies(<138 copies/mL). A negative result must be combined with clinical observations, patient history, and epidemiological information. The expected result is Negative.  Fact Sheet for Patients:  08/22/21  Fact Sheet for Healthcare Providers:  BloggerCourse.com  This test is no t yet approved or cleared by the [place] FDA and  has been authorized for detection and/or diagnosis of SARS-CoV-2 by FDA under an Emergency Use Authorization (EUA). This EUA will remain  in effect (meaning this test can be used)  for the duration of the COVID-19 declaration under Section 564(b)(1) of the Act, 21 U.S.C.section 360bbb-3(b)(1), unless the authorization is terminated  or revoked sooner.       Influenza A by PCR NEGATIVE NEGATIVE Final   Influenza B by PCR NEGATIVE NEGATIVE Final    Comment: (NOTE) The Xpert Xpress SARS-CoV-2/FLU/RSV plus assay is intended as an aid in the diagnosis of influenza from Nasopharyngeal swab specimens and should not be used as a sole basis for treatment. Nasal washings and aspirates are unacceptable for Xpert Xpress SARS-CoV-2/FLU/RSV testing.  Fact Sheet for Patients: BloggerCourse.com  Fact Sheet for Healthcare Providers: SeriousBroker.it  This test is not yet approved or cleared by the [place] FDA  and has been authorized for detection and/or diagnosis of SARS-CoV-2 by FDA under an Emergency Use Authorization (EUA). This EUA will remain in effect (meaning this test can be used) for the duration of the COVID-19 declaration under Section 564(b)(1) of the Act, 21 U.S.C. section 360bbb-3(b)(1), unless the authorization is terminated or revoked.  Performed at [facility], [address] [place], [place] [address]   Blood Culture (routine x 2)     Status: None (Preliminary result)   Collection Time: 06/22/21  5:39 PM   Specimen: BLOOD  Result Value Ref Range Status   Specimen Description BLOOD BLOOD RIGHT FOREARM  Final   Special Requests   Final    BOTTLES DRAWN AEROBIC AND ANAEROBIC Blood Culture results may not be optimal due to an inadequate volume of blood received in culture bottles   Culture   Final    NO GROWTH 3 DAYS Performed at [facility], [address], [place], [place] [address]    Report Status PENDING  Incomplete  Blood Culture (routine x 2)     Status: None (Preliminary result)   Collection Time: 06/22/21  5:39 PM   Specimen: BLOOD  Result Value Ref Range Status   Specimen Description BLOOD LEFT ANTECUBITAL  Final   Special Requests   Final    BOTTLES DRAWN AEROBIC AND ANAEROBIC Blood Culture adequate volume   Culture   Final    NO GROWTH 3 DAYS Performed at [facility], [address] [place], [place] [address]    Report Status PENDING  Incomplete  Urine Culture     Status: None   Collection Time: 06/22/21  5:39 PM   Specimen: In/Out Cath Urine  Result Value Ref Range Status   Specimen Description   Final    IN/OUT CATH URINE Performed at [facility], [address] [place], [place] [address]    Special Requests   Final    NONE Performed at [facility], [address], [place], [place] [address]    Culture   Final    NO GROWTH Performed at [facility], [address], [place],  [place] [address]    Report Status 06/24/2021 FINAL  Final  Respiratory (~20 pathogens) panel by PCR     Status: Abnormal   Collection Time: 06/22/21  5:39 PM   Specimen: Nasopharyngeal Swab; Respiratory  Result Value Ref Range Status   Adenovirus NOT DETECTED NOT DETECTED Final   Coronavirus 229E NOT DETECTED NOT DETECTED Final    Comment: (NOTE) The Coronavirus on the Respiratory Panel, DOES NOT test for the novel  Coronavirus (2019 nCoV)    Coronavirus HKU1 NOT DETECTED NOT DETECTED Final   Coronavirus NL63 NOT DETECTED NOT DETECTED Final   Coronavirus OC43 NOT DETECTED  NOT DETECTED Final   Metapneumovirus NOT DETECTED NOT DETECTED Final   Rhinovirus / Enterovirus NOT DETECTED NOT DETECTED Final   Influenza A NOT DETECTED NOT DETECTED Final   Influenza B NOT DETECTED NOT DETECTED Final   Parainfluenza Virus 1 NOT DETECTED NOT DETECTED Final   Parainfluenza Virus 2 NOT DETECTED NOT DETECTED Final   Parainfluenza Virus 3 NOT DETECTED NOT DETECTED Final   Parainfluenza Virus 4 NOT DETECTED NOT DETECTED Final   Respiratory Syncytial Virus DETECTED (A) NOT DETECTED Final   Bordetella pertussis NOT DETECTED NOT DETECTED Final   Bordetella Parapertussis NOT DETECTED NOT DETECTED Final   Chlamydophila pneumoniae NOT DETECTED NOT DETECTED Final   Mycoplasma pneumoniae NOT DETECTED NOT DETECTED Final    Comment: Performed at [facility], [address] [place], [place] [address]         Radiology Studies: No results found.      Scheduled Meds:  benzonatate  200 mg Oral TID   enoxaparin (LOVENOX) injection  40 mg Subcutaneous Q24H   ipratropium-albuterol  3 mL Nebulization Q6H   methylPREDNISolone (SOLU-MEDROL) injection  40 mg Intravenous Q12H   oxyCODONE-acetaminophen  1 tablet Oral Q12H   And   oxyCODONE  5 mg Oral Q12H   pantoprazole  40 mg Oral Daily   sertraline  100 mg Oral Daily   Continuous Infusions:   LOS: 3 days    Time spent: 25 minutes    Tresa Moore, MD [facility]   If 7PM-7AM, please contact night-coverage  06/25/2021, 10:42 AM

## 2021-06-26 DIAGNOSIS — J9601 Acute respiratory failure with hypoxia: Secondary | ICD-10-CM | POA: Diagnosis not present

## 2021-06-26 MED ORDER — GUAIFENESIN ER 600 MG PO TB12
600.0000 mg | ORAL_TABLET | Freq: Two times a day (BID) | ORAL | Status: DC
  Administered 2021-06-26 – 2021-06-27 (×3): 600 mg via ORAL
  Filled 2021-06-26 (×3): qty 1

## 2021-06-26 MED ORDER — IPRATROPIUM-ALBUTEROL 0.5-2.5 (3) MG/3ML IN SOLN
3.0000 mL | Freq: Three times a day (TID) | RESPIRATORY_TRACT | Status: DC
  Administered 2021-06-27 (×2): 3 mL via RESPIRATORY_TRACT
  Filled 2021-06-26 (×2): qty 3

## 2021-06-26 MED ORDER — PREDNISONE 20 MG PO TABS
40.0000 mg | ORAL_TABLET | Freq: Every day | ORAL | Status: DC
  Administered 2021-06-26 – 2021-06-27 (×2): 40 mg via ORAL
  Filled 2021-06-26: qty 2

## 2021-06-26 NOTE — Progress Notes (Signed)
Mobility Specialist - Progress Note   06/26/21 1600  Mobility  Activity Ambulated in room  Level of Assistance Minimal assist, patient does 75% or more  Assistive Device Other (Comment) (HHA)  Distance Ambulated (ft) 100 ft  Mobility Ambulated with assistance in room  Mobility Response Tolerated well  Mobility performed by Mobility specialist  $Mobility charge 1 Mobility    Pre-mobility: 55 HR, 98% SpO2 During mobility: 85 HR, 90-96% SpO2 Post-mobility: 75 HR, 97% SpO2   Pt lying in bed upon arrival, utilizing 2L. Pt voiced fatigue, but agreeable to activity. Pt weaned to RA during ambulation, sats >/= 90%. Denied SOB. Pt left in bed on 2L with alarm set, family at bedside. RN notified.   Morgan Abbott Mobility Specialist 06/26/21, 4:56 PM

## 2021-06-26 NOTE — TOC Initial Note (Signed)
Transition of Care [facility]) - Initial/Assessment Note    Patient Details  Name: Morgan Abbott MRN: [id number] Date of Birth: Nov 23, 1945  Transition of Care [facility]) CM/SW Contact:    Chapman Fitch, RN Phone Number: 06/26/2021, 1:43 PM  Clinical Narrative:                   Patient admitted from home with RSV Patient states that she lives in her own home and her granddaughter lives with her.   [facility] States that at baseline she is able to take her self to appointments  Patient has cane and rollator in the home Patient currently requiring 2L acute O2  Patient agreeable to home health services.  States that she does not have a preference of home health agency.  Referral made and accepted by Barbara Cower with [facility]   Expected Discharge Plan: Home w Home Health Services Barriers to Discharge: Continued Medical Work up   Patient Goals and CMS Choice        Expected Discharge Plan and Services Expected Discharge Plan: Home w Home Health Services   Discharge Planning Services: CM Consult   Living arrangements for the past 2 months: Single Family Home                           HH Arranged: RN, OT, PT [facility] Agency: [facility] Date HH Agency Contacted: 06/26/21   Representative spoke with at [facility] Agency: Barbara Cower  Prior Living Arrangements/Services Living arrangements for the past 2 months: Single Family Home Lives with:: Relatives Patient language and need for interpreter reviewed:: Yes Do you feel safe going back to the place where you live?: Yes      Need for Family Participation in Patient Care: Yes (Comment) Care giver support system in place?: Yes (comment) Current home services: DME Criminal Activity/Legal Involvement Pertinent to Current Situation/Hospitalization: No - Comment as needed  Activities of Daily Living Home Assistive Devices/Equipment: None ADL Screening (condition at time of admission) Patient's  cognitive ability adequate to safely complete daily activities?: Yes Is the patient deaf or have difficulty hearing?: No Does the patient have difficulty seeing, even when wearing glasses/contacts?: No Does the patient have difficulty concentrating, remembering, or making decisions?: No Patient able to express need for assistance with ADLs?: No Does the patient have difficulty dressing or bathing?: No Independently performs ADLs?: Yes (appropriate for developmental age) Does the patient have difficulty walking or climbing stairs?: No Weakness of Legs: Both Weakness of Arms/Hands: Both  Permission Sought/Granted                  Emotional Assessment       Orientation: : Oriented to Self, Oriented to Place, Oriented to  Time, Oriented to Situation Alcohol / Substance Use: Not Applicable Psych Involvement: No (comment)  Admission diagnosis:  Acute respiratory failure with hypoxia (HCC) [J96.01] Patient Active Problem List   Diagnosis Date Noted   Acute respiratory failure with hypoxia (HCC) 06/22/2021   Elevated troponin 06/22/2021   Chronic pain 06/22/2021   Depression 06/22/2021   Asthma, chronic, unspecified asthma severity, with acute exacerbation 06/22/2021   Encounter for general adult medical examination with abnormal findings 09/03/2020   Grief reaction 07/18/2020   Encounter for screening mammogram for malignant neoplasm of breast 05/06/2020   Need for vaccination against Streptococcus pneumoniae using pneumococcal conjugate vaccine 13 05/06/2020   Dysuria 05/06/2020   Other  fatigue 01/01/2020   Depression, major, single episode, moderate (HCC) 01/01/2020   Mild intermittent asthma without complication 02/22/2019   Gastroesophageal reflux disease without esophagitis 02/22/2019   Generalized anxiety disorder 02/22/2019   Urinary tract infection without hematuria 10/26/2017   B12 deficiency 10/26/2017   Vitamin D deficiency 10/26/2017   Nausea 10/26/2017   Iron  deficiency anemia 10/26/2017   Acute upper respiratory infection, unspecified 09/25/2017   Cough 09/25/2017   PCP:  Sallyanne Kuster, NP [facility] [phone] [place], [place] - [address] [place] [address] Phone: [phone] Fax: [phone]     Social Determinants of Health (SDOH) Interventions    Readmission Risk Interventions No flowsheet data found.

## 2021-06-26 NOTE — Progress Notes (Signed)
PROGRESS NOTE    Morgan Abbott  KVQ:[id number] DOB: [birth year] DOA: 06/22/2021 PCP: Sallyanne Kuster, NP    Brief Narrative:  75 y.o. female with medical history significant for hx of remote colon cancer s/p colectomy asthma, GERD, anxiety depression, chronic pain on opioids who presents with concerns of cough and congestion.   History provided mostly by granddaughter at bedside since patient was lethargic and ill-appearing.  Patient lives with her granddaughter and works in Designer, industrial/product job.  About 2 days ago she was otherwise in her normal state of health and went to work but then that evening developed an acute wet cough with chest congestion.  Cough continue to get worse and she also experienced headache daily.  She then went to urgent care and had checks x-ray Dr. Charna Busman left-sided pleural effusion and presented here to the ED.    Follow-up chest CT with contrast did not demonstrate any effusion or consolidation.  Respiratory viral panel positive for respiratory syncytial virus.   Assessment & Plan:   Principal Problem:   Acute respiratory failure with hypoxia (HCC) Active Problems:   Generalized anxiety disorder   Elevated troponin   Chronic pain   Depression   Asthma, chronic, unspecified asthma severity, with acute exacerbation  Respiratory syncytial virus infection Acute hypoxic respiratory failure CTA chest unrevealing for pneumonia Wheezing on exam RVP positive for RSV Procalcitonin negative Antibiotics stopped IV steroids stopped Plan: Prednisone 40 mg a day to start today.  Limit to 5-day course No further IV fluids, encourage p.o. fluid intake Continue bronchodilators Antitussives Wean oxygen as tolerated, patient on room air as of 10/10 Patient's continues to improve clinically anticipate discharge 10/12   Headache Likely in the setting of coughing and viral infection Fioricet ineffective Sumatriptan 50 mg p.o. every 2 hours as needed Was effective, no  further headache   Elevated troponin Unlikely to represent ACS NSTEMI essentially ruled out Troponins peaked, now downtrending Plan: Heparin stopped.  ACS ruled out   Chronic pain Continue home narcotic regimen   GERD PPI   Depression Zoloft   DVT prophylaxis: SQ Lovenox Code Status: Full Family Communication: Family member at bedside 10/10 Disposition Plan: Status is: Inpatient  Remains inpatient appropriate because:Inpatient level of care appropriate due to severity of illness  Dispo: The patient is from: Home              Anticipated d/c is to: Home              Patient currently is not medically stable to d/c.   Difficult to place patient No  Anticipated date of discharge 7/12     Level of care: Progressive Cardiac  Consultants:  None  Procedures:  None  Antimicrobials: None   Subjective: Seen and examined.  Continues to feel very weak and fatigued.  Objective: Vitals:   06/26/21 0240 06/26/21 0454 06/26/21 0736 06/26/21 1122  BP:  123/64 107/62 (!) 106/55  Pulse:  66 61 82  Resp:  16 18 18   Temp:  97.7 F (36.5 C) 98.4 F (36.9 C) 99.1 F (37.3 C)  TempSrc:   Oral   SpO2: 96% 93% 93% 97%  Weight:      Height:        Intake/Output Summary (Last 24 hours) at 06/26/2021 1425 Last data filed at 06/26/2021 0500 Gross per 24 hour  Intake 240 ml  Output --  Net 240 ml   Filed Weights   06/22/21 1628 06/22/21 2047 06/23/21 0138  Weight: 75.8  kg 75.8 kg 77.2 kg    Examination:  General exam: No acute distress.  Appears fatigued Respiratory system: Scattered crackles and wheeze.  Normal work of breathing.  Room air Cardiovascular system: S1-S2, RRR, no murmurs, no pedal edema Gastrointestinal system: Soft, NT/ND, normal bowel sounds Central nervous system: Alert and oriented. No focal neurological deficits. Extremities: Symmetric 5 x 5 power. Skin: No rashes, lesions or ulcers Psychiatry: Judgement and insight appear normal. Mood &  affect appropriate.     Data Reviewed: I have personally reviewed following labs and imaging studies  CBC: Recent Labs  Lab 06/22/21 1635 06/23/21 0251 06/25/21 0746  WBC 7.8 10.0 8.4  NEUTROABS  --   --  7.7  HGB 12.5 11.3* 11.2*  HCT 36.3 33.1* 33.1*  MCV 90.8 93.0 88.0  PLT 183 138* 144*   Basic Metabolic Panel: Recent Labs  Lab 06/22/21 1635 06/23/21 0251 06/25/21 0746  NA 133* 132* 139  K 4.4 4.8 3.7  CL 97* 97* 100  CO2 25 28 32  GLUCOSE 121* 122* 141*  BUN 21 16 13   CREATININE 0.99 0.82 0.80  CALCIUM 8.8* 8.1* 9.0   GFR: Estimated Creatinine Clearance: 60.7 mL/min (by C-G formula based on SCr of 0.8 mg/dL). Liver Function Tests: No results for input(s): AST, ALT, ALKPHOS, BILITOT, PROT, ALBUMIN in the last 168 hours. No results for input(s): LIPASE, AMYLASE in the last 168 hours. No results for input(s): AMMONIA in the last 168 hours. Coagulation Profile: Recent Labs  Lab 06/22/21 1636  INR 1.1   Cardiac Enzymes: No results for input(s): CKTOTAL, CKMB, CKMBINDEX, TROPONINI in the last 168 hours. BNP (last 3 results) No results for input(s): PROBNP in the last 8760 hours. HbA1C: No results for input(s): HGBA1C in the last 72 hours. CBG: No results for input(s): GLUCAP in the last 168 hours. Lipid Profile: No results for input(s): CHOL, HDL, LDLCALC, TRIG, CHOLHDL, LDLDIRECT in the last 72 hours. Thyroid Function Tests: No results for input(s): TSH, T4TOTAL, FREET4, T3FREE, THYROIDAB in the last 72 hours. Anemia Panel: No results for input(s): VITAMINB12, FOLATE, FERRITIN, TIBC, IRON, RETICCTPCT in the last 72 hours. Sepsis Labs: Recent Labs  Lab 06/22/21 1635 06/22/21 1739 06/23/21 0740  PROCALCITON  --   --  <0.10  LATICACIDVEN 1.3 1.1  --     Recent Results (from the past 240 hour(s))  Resp Panel by RT-PCR (Flu A&B, Covid) Nasopharyngeal Swab     Status: None   Collection Time: 06/22/21  5:39 PM   Specimen: Nasopharyngeal Swab;  Nasopharyngeal(NP) swabs in vial transport medium  Result Value Ref Range Status   SARS Coronavirus 2 by RT PCR NEGATIVE NEGATIVE Final    Comment: (NOTE) SARS-CoV-2 target nucleic acids are NOT DETECTED.  The SARS-CoV-2 RNA is generally detectable in upper respiratory specimens during the acute phase of infection. The lowest concentration of SARS-CoV-2 viral copies this assay can detect is 138 copies/mL. A negative result does not preclude SARS-Cov-2 infection and should not be used as the sole basis for treatment or other patient management decisions. A negative result may occur with  improper specimen collection/handling, submission of specimen other than nasopharyngeal swab, presence of viral mutation(s) within the areas targeted by this assay, and inadequate number of viral copies(<138 copies/mL). A negative result must be combined with clinical observations, patient history, and epidemiological information. The expected result is Negative.  Fact Sheet for Patients:  08/22/21  Fact Sheet for Healthcare Providers:  BloggerCourse.com  This test is no  t yet approved or cleared by the [place] and  has been authorized for detection and/or diagnosis of SARS-CoV-2 by FDA under an Emergency Use Authorization (EUA). This EUA will remain  in effect (meaning this test can be used) for the duration of the COVID-19 declaration under Section 564(b)(1) of the Act, 21 U.S.C.section 360bbb-3(b)(1), unless the authorization is terminated  or revoked sooner.       Influenza A by PCR NEGATIVE NEGATIVE Final   Influenza B by PCR NEGATIVE NEGATIVE Final    Comment: (NOTE) The Xpert Xpress SARS-CoV-2/FLU/RSV plus assay is intended as an aid in the diagnosis of influenza from Nasopharyngeal swab specimens and should not be used as a sole basis for treatment. Nasal washings and aspirates are unacceptable for Xpert Xpress  SARS-CoV-2/FLU/RSV testing.  Fact Sheet for Patients: BloggerCourse.com  Fact Sheet for Healthcare Providers: SeriousBroker.it  This test is not yet approved or cleared by the [place] FDA and has been authorized for detection and/or diagnosis of SARS-CoV-2 by FDA under an Emergency Use Authorization (EUA). This EUA will remain in effect (meaning this test can be used) for the duration of the COVID-19 declaration under Section 564(b)(1) of the Act, 21 U.S.C. section 360bbb-3(b)(1), unless the authorization is terminated or revoked.  Performed at [facility], [address] [place], [place] [address]   Blood Culture (routine x 2)     Status: None (Preliminary result)   Collection Time: 06/22/21  5:39 PM   Specimen: BLOOD  Result Value Ref Range Status   Specimen Description BLOOD BLOOD RIGHT FOREARM  Final   Special Requests   Final    BOTTLES DRAWN AEROBIC AND ANAEROBIC Blood Culture results may not be optimal due to an inadequate volume of blood received in culture bottles   Culture   Final    NO GROWTH 4 DAYS Performed at [facility], [address] [place], [place] [address]    Report Status PENDING  Incomplete  Blood Culture (routine x 2)     Status: None (Preliminary result)   Collection Time: 06/22/21  5:39 PM   Specimen: BLOOD  Result Value Ref Range Status   Specimen Description BLOOD LEFT ANTECUBITAL  Final   Special Requests   Final    BOTTLES DRAWN AEROBIC AND ANAEROBIC Blood Culture adequate volume   Culture   Final    NO GROWTH 4 DAYS Performed at [facility], [address], [place], [place] [address]    Report Status PENDING  Incomplete  Urine Culture     Status: None   Collection Time: 06/22/21  5:39 PM   Specimen: In/Out Cath Urine  Result Value Ref Range Status   Specimen Description   Final    IN/OUT CATH URINE Performed at [facility], [address] [place], [place] [address]    Special Requests   Final    NONE Performed at [facility], [address], [place], [place] [address]    Culture   Final    NO GROWTH Performed at [facility], [address], [place], [place] [address]    Report Status 06/24/2021 FINAL  Final  Respiratory (~20 pathogens) panel by PCR     Status: Abnormal   Collection Time: 06/22/21  5:39 PM   Specimen: Nasopharyngeal Swab; Respiratory  Result Value Ref Range Status   Adenovirus NOT DETECTED NOT DETECTED Final   Coronavirus 229E NOT DETECTED NOT DETECTED Final    Comment: (  NOTE) The Coronavirus on the Respiratory Panel, DOES NOT test for the novel  Coronavirus (2019 nCoV)    Coronavirus HKU1 NOT DETECTED NOT DETECTED Final   Coronavirus NL63 NOT DETECTED NOT DETECTED Final   Coronavirus OC43 NOT DETECTED NOT DETECTED Final   Metapneumovirus NOT DETECTED NOT DETECTED Final   Rhinovirus / Enterovirus NOT DETECTED NOT DETECTED Final   Influenza A NOT DETECTED NOT DETECTED Final   Influenza B NOT DETECTED NOT DETECTED Final   Parainfluenza Virus 1 NOT DETECTED NOT DETECTED Final   Parainfluenza Virus 2 NOT DETECTED NOT DETECTED Final   Parainfluenza Virus 3 NOT DETECTED NOT DETECTED Final   Parainfluenza Virus 4 NOT DETECTED NOT DETECTED Final   Respiratory Syncytial Virus DETECTED (A) NOT DETECTED Final   Bordetella pertussis NOT DETECTED NOT DETECTED Final   Bordetella Parapertussis NOT DETECTED NOT DETECTED Final   Chlamydophila pneumoniae NOT DETECTED NOT DETECTED Final   Mycoplasma pneumoniae NOT DETECTED NOT DETECTED Final    Comment: Performed at [facility], [address] [place], [place] [address]         Radiology Studies: No results found.      Scheduled Meds:  enoxaparin (LOVENOX) injection  40 mg Subcutaneous Q24H   guaiFENesin  600 mg Oral BID   ipratropium-albuterol  3 mL Nebulization Q6H   oxyCODONE-acetaminophen  1 tablet Oral Q12H   And    oxyCODONE  5 mg Oral Q12H   pantoprazole  40 mg Oral Daily   [START ON 06/27/2021] predniSONE  40 mg Oral Q breakfast   sertraline  100 mg Oral Daily   Continuous Infusions:   LOS: 4 days    Time spent: 25 minutes    Tresa Moore, MD [facility]   If 7PM-7AM, please contact night-coverage  06/26/2021, 2:25 PM

## 2021-06-26 NOTE — Progress Notes (Signed)
Physical Therapy Treatment Patient Details Name: Morgan Abbott MRN: [id number] DOB: [birth year] Today's Date: 06/26/2021   History of Present Illness 75 y.o. female with medical history significant for hx of remote colon cancer s/p colectomy asthma, GERD, anxiety depression, chronic pain on opioids who presents with concerns of cough and congestion. Per MD, "Chest x-ray and CTA chest was unrevealing for pneumonia but patient clearly ill-appearing with wheezing on lung exam." Follow-up chest CT with contrast did not demonstrate any effusion or consolidation.  Respiratory viral panel positive for respiratory syncytial virus.    PT Comments    Pt continuing to make progress towards therapy goals. Pt remains with a flat affect and disoriented intermittently throughout session. Pt able to ambulate 200 ft with RW and SPV for safety with 2L of oxygen maintaining >93% SpO2. Pt able to climb 5 steps with usage of L railing only and required SPV. Pt demonstrating increased safety and balance with usage of RW. Recommending usage of RW at discharge and HHPT to further mobility and decreased assistive device usage as appropriate. Will continue to progress mobility. Pt left seated in chair with all needs within reach.   Recommendations for follow up therapy are one component of a multi-disciplinary discharge planning process, led by the attending physician.  Recommendations may be updated based on patient status, additional functional criteria and insurance authorization.  Follow Up Recommendations  Home health PT;Supervision - Intermittent     Equipment Recommendations  Rolling walker with 5" wheels    Recommendations for Other Services       Precautions / Restrictions Restrictions Weight Bearing Restrictions: No     Mobility  Bed Mobility Overal bed mobility: Independent Bed Mobility: Supine to Sit     Supine to sit: Modified independent (Device/Increase time)     General bed mobility  comments: Increased time to complete with usage of bed rails    Transfers Overall transfer level: Needs assistance Equipment used: Rolling walker (2 wheeled) Transfers: Sit to/from Stand Sit to Stand: Supervision         General transfer comment: SPV for both low and high surfaces  Ambulation/Gait Ambulation/Gait assistance: Supervision Gait Distance (Feet): 200 Feet Assistive device: Rolling walker (2 wheeled) Gait Pattern/deviations: Step-through pattern;Trunk flexed Gait velocity: decreased   General Gait Details: Decreased step length throughout and cues for upright posture. Able to navigate through narrow spaces in room without verbal cueing but did require additional time to complete.   Stairs Stairs: Yes Stairs assistance: Supervision Stair Management: One rail Left;Alternating pattern Number of Stairs: 5 General stair comments: Ascended with alternating step pattern. Descended stairs with lateral step to pattern with B UE support on L rail   Wheelchair Mobility    Modified Rankin (Stroke Patients Only)       Balance Overall balance assessment: Needs assistance Sitting-balance support: No upper extremity supported;Feet supported Sitting balance-Leahy Scale: Good     Standing balance support: Bilateral upper extremity supported;During functional activity Standing balance-Leahy Scale: Fair Standing balance comment: Able to take a few steps without UE support. Benefits from use of RW to improve balance                            Cognition Arousal/Alertness: Awake/alert Behavior During Therapy: Flat affect Overall Cognitive Status: No family/caregiver present to determine baseline cognitive functioning  General Comments: Pt seems slightly disoriented still to time and presents with garbled speech      Exercises Other Exercises Other Exercises: Pt transfered to [facility] with SPV prior to ambulation  trial.    General Comments        Pertinent Vitals/Pain Pain Assessment: Faces Faces Pain Scale: Hurts a little bit Pain Location: no location given just reports arthritis Pain Intervention(s): Limited activity within patient's tolerance;Monitored during session;Repositioned    Home Living                      Prior Function            PT Goals (current goals can now be found in the care plan section) Acute Rehab PT Goals Patient Stated Goal: to feel better PT Goal Formulation: With patient Time For Goal Achievement: 07/09/21 Potential to Achieve Goals: Good Progress towards PT goals: Progressing toward goals    Frequency    Min 2X/week      PT Plan Current plan remains appropriate    Co-evaluation              AM-PAC PT "6 Clicks" Mobility   Outcome Measure  Help needed turning from your back to your side while in a flat bed without using bedrails?: None Help needed moving from lying on your back to sitting on the side of a flat bed without using bedrails?: None Help needed moving to and from a bed to a chair (including a wheelchair)?: A Little Help needed standing up from a chair using your arms (e.g., wheelchair or bedside chair)?: A Little Help needed to walk in hospital room?: A Little Help needed climbing 3-5 steps with a railing? : A Little 6 Click Score: 20    End of Session Equipment Utilized During Treatment: Gait belt;Oxygen Activity Tolerance: Patient tolerated treatment well Patient left: in chair;with call bell/phone within reach;with chair alarm set Nurse Communication: Mobility status PT Visit Diagnosis: Unsteadiness on feet (R26.81);Muscle weakness (generalized) (M62.81)     Time: 2620-3559 PT Time Calculation (min) (ACUTE ONLY): 38 min  Charges:  $Gait Training: 23-37 mins $Therapeutic Activity: 8-22 mins                     Verl Blalock, SPT    Verl Blalock 06/26/2021, 12:55 PM

## 2021-06-27 DIAGNOSIS — J9601 Acute respiratory failure with hypoxia: Secondary | ICD-10-CM

## 2021-06-27 LAB — CULTURE, BLOOD (ROUTINE X 2)
Culture: NO GROWTH
Culture: NO GROWTH
Special Requests: ADEQUATE

## 2021-06-27 MED ORDER — PREDNISONE 20 MG PO TABS: 40.0000 mg | ORAL_TABLET | Freq: Every day | ORAL | 0 refills | Status: AC

## 2021-06-27 MED ORDER — ALBUTEROL SULFATE HFA 108 (90 BASE) MCG/ACT IN AERS: 2.0000 | INHALATION_SPRAY | Freq: Four times a day (QID) | RESPIRATORY_TRACT | 0 refills | Status: DC | PRN

## 2021-06-27 NOTE — Discharge Summary (Signed)
Morgan Abbott WUJ:[id number] PUCILLOAug-1947 DOA: 06/22/2021  PCP: Sallyanne Kuster, NP  Admit date: 06/22/2021 Discharge date: 06/27/2021  Admitted From: home Disposition:  home  Recommendations for Outpatient Follow-up:  Follow up with PCP in 1 week Please obtain BMP/CBC in one week   Home Health:yes   Discharge Condition:Stable CODE STATUS:Full  Diet recommendation: Heart Healthy   Brief/Interim Summary: Per HPI: Morgan Abbott is a 75 y.o. female with medical history significant for hx of remote colon cancer s/p colectomy asthma, GERD, anxiety depression, chronic pain on opioids who presents with concerns of cough and congestion.   History provided mostly by granddaughter at bedside since patient was lethargic and ill-appearing.  Patient lives with her granddaughter and works in Designer, industrial/product job.  About 2 days ago she was otherwise in her normal state of health and went to work but then that evening developed an acute wet cough with chest congestion.  Cough continue to get worse and she also experienced headache daily.  She then went to urgent care and had checks x-ray Dr. Charna Busman left-sided pleural effusion and presented here to the ED.  She denies any fever at home.  No nausea, vomiting or diarrhea.  Denies any neck stiffness or vision changes with her headache. \ However, granddaughter does note that at baseline she has some shortness of breath with exertion like going up the stairs or in hot weather.  No lower extremity edema.   ED Course: She had a temperature of 100F, heart rate of 94, hypoxic down to 87% and was placed on 2 L.  CBC showed no leukocytosis or anemia.  Lactate of 1.3.  Sodium of 133, potassium of 4.4, chloride of 97, creatinine of 0.99, BG 121. Troponin was eleveated. EKG reviewed by me shows normal sinus rhythm.  Chest x-ray was negative for any acute process.  CTA chest was negative for PE, pulmonary edema or pneumonia.  COVID and flu PCR negative. RSV was positive  on respiratory panel.    Respiratory syncytial virus infection Acute hypoxic respiratory failure CTA chest unrevealing for pneumonia, some atelectasis , No PE RVP positive for RSV Procalcitonin negative Antibiotics initially was started empirically and then stop stopped Placed on Prednisone short course as she was wheezing initially. Prednisone 40 mg a day to start today.  Limit to 5-day course Started on bronchodilators. Antitussives Weaned off 02 to RA.    Headache Likely in the setting of coughing and viral infection improved   Elevated troponin ACS/NSTEMI ruled out Tp trended down Likely demand ischemia Echo EF 55 to 60%.  No regional wall motion abnormalities.    Chronic pain Continue home narcotic regimen   GERD PPI   Depression Zoloft      Discharge Diagnoses:  Principal Problem:   Acute respiratory failure with hypoxia (HCC) Active Problems:   Generalized anxiety disorder   Elevated troponin   Chronic pain   Depression   Asthma, chronic, unspecified asthma severity, with acute exacerbation    Discharge Instructions  Discharge Instructions     Call MD for:  difficulty breathing, headache or visual disturbances   Complete by: As directed    Diet - low sodium heart healthy   Complete by: As directed    Discharge instructions   Complete by: As directed    Use incentive spirometer 10x per hour   Increase activity slowly   Complete by: As directed       Allergies as of 06/27/2021   No Known Allergies  Medication List     STOP taking these medications    ibuprofen 800 MG tablet Commonly known as: ADVIL   methylPREDNISolone 4 MG Tbpk tablet Commonly known as: MEDROL DOSEPAK       TAKE these medications    albuterol 108 (90 Base) MCG/ACT inhaler Commonly known as: VENTOLIN HFA Inhale 2 puffs into the lungs every 6 (six) hours as needed for up to 7 days for wheezing or shortness of breath.   betamethasone dipropionate 0.05 %  cream Apply topically 2 (two) times daily.   dextromethorphan-guaiFENesin 30-600 MG 12hr tablet Commonly known as: MUCINEX DM Take 1 tablet by mouth 2 (two) times daily.   diazepam 5 MG tablet Commonly known as: VALIUM TAKE 1 TABLET (5 MG TOTAL) BY MOUTH EVERY 12 (TWELVE) HOURS AS NEEDED FOR ANXIETY. OR SLEEP   ergocalciferol 1.25 MG (50000 UT) capsule Commonly known as: VITAMIN D2 Take 1 capsule (50,000 Units total) by mouth every 14 (fourteen) days.   omeprazole 40 MG capsule Commonly known as: PRILOSEC TAKE 1 CAPSULE (40 MG TOTAL) BY MOUTH DAILY.   oxyCODONE-acetaminophen 10-325 MG tablet Commonly known as: PERCOCET Take by mouth. 1 TAB BY MOUTH EVERY 12 HRS X 10 DAYS, 1 TAB EVERY 8 HRS X 10 DAYS THEN 1 TAB EVERY 6 HRS X 10 DAYS   predniSONE 20 MG tablet Commonly known as: DELTASONE Take 2 tablets (40 mg total) by mouth daily with breakfast for 2 days. Start taking on: June 28, 2021   sertraline 100 MG tablet Commonly known as: ZOLOFT TAKE 1 TABLET BY MOUTH EVERY DAY               Durable Medical Equipment  (From admission, onward)           Start     Ordered   06/27/21 1304  For home use only DME Walker rolling  Once       Question Answer Comment  Walker: With 5 Inch Wheels   Patient needs a walker to treat with the following condition Weakness      06/27/21 1303   06/26/21 0734  For home use only DME Walker rolling  Once       Question Answer Comment  Walker: With 5 Inch Wheels   Patient needs a walker to treat with the following condition Weakness      06/26/21 0733            Follow-up Information     Abernathy, Arlyss Repress, NP Follow up in 1 week(s).   Specialty: Nurse Practitioner Contact information: [address] [place] [address] [phone]                No Known Allergies  Consultations:    Procedures/Studies: DG Chest 2 View  Result Date: 06/22/2021 CLINICAL DATA:  Dyspnea EXAM: CHEST - 2 VIEW COMPARISON:   03/18/2017 chest radiograph. FINDINGS: Chronic marked elevation of the left hemidiaphragm, unchanged. Stable cardiomediastinal silhouette with normal heart size. No pneumothorax. No pleural effusion. Left basilar passive atelectasis, chronic. No pulmonary edema. No acute consolidative airspace disease. IMPRESSION: Chronic marked elevation of the left hemidiaphragm with left basilar passive atelectasis. No acute cardiopulmonary disease. Electronically Signed   By: Delbert Phenix M.D.   On: 06/22/2021 17:13   CT Angio Chest PE W and/or Wo Contrast  Result Date: 06/22/2021 CLINICAL DATA:  Short of breath, cough, left pleural effusion, abnormal chest x-ray EXAM: CT ANGIOGRAPHY CHEST WITH CONTRAST TECHNIQUE: Multidetector CT imaging of the chest was  performed using the standard protocol during bolus administration of intravenous contrast. Multiplanar CT image reconstructions and MIPs were obtained to evaluate the vascular anatomy. CONTRAST:  75mL OMNIPAQUE IOHEXOL 350 MG/ML SOLN COMPARISON:  06/22/2021, 08/17/2004 FINDINGS: Cardiovascular: This is a technically adequate evaluation of the pulmonary vasculature. No filling defects or pulmonary emboli. The heart is unremarkable without pericardial effusion. Marked ectasia of the thoracic aorta without evidence of aneurysm or dissection. Mediastinum/Nodes: No enlarged mediastinal, hilar, or axillary lymph nodes. Thyroid gland, trachea, and esophagus demonstrate no significant findings. Lungs/Pleura: There is elevation of the left hemidiaphragm with compressive atelectasis involving the lingula and left lower lobe. This likely accounts for the chest x-ray finding. No acute airspace disease, effusion, or pneumothorax. Central airways are patent. Upper Abdomen: No acute abnormality. Musculoskeletal: No acute or destructive bony lesions. Reconstructed images demonstrate no additional findings. Review of the MIP images confirms the above findings. IMPRESSION: 1. No evidence of  pulmonary embolus. 2. Elevated left hemidiaphragm, with compressive atelectasis at the left lung base. No acute airspace disease or effusion. Electronically Signed   By: Sharlet Salina M.D.   On: 06/22/2021 18:28   ECHOCARDIOGRAM COMPLETE  Result Date: 06/23/2021    ECHOCARDIOGRAM REPORT   Patient Name:   Morgan Abbott Date of Exam: 06/23/2021 Medical Rec #:  [id number]     Height:       63.0 in Accession #:    [id number]    Weight:       170.1 lb Date of Birth:  11-23-1945     BSA:          1.805 m Patient Age:    75 years      BP:           119/71 mmHg Patient Gender: F             HR:           80 bpm. Exam Location:  [facility] Procedure: 2D Echo Indications:     Elevated Troponin  History:         Patient has no prior history of Echocardiogram examinations.  Sonographer:     Overton Mam RDCS Referring Phys:  [id number] [facility] Diagnosing Phys: Adrian Blackwater  Sonographer Comments: Suboptimal parasternal window and Technically difficult study due to poor echo windows. Image acquisition challenging due to respiratory motion. IMPRESSIONS  1. Left ventricular ejection fraction, by estimation, is 55 to 60%. The left ventricle has normal function. The left ventricle has no regional wall motion abnormalities. There is mild concentric left ventricular hypertrophy. Left ventricular diastolic parameters are consistent with Grade I diastolic dysfunction (impaired relaxation).  2. Right ventricular systolic function is normal. The right ventricular size is normal.  3. Left atrial size was mild to moderately dilated.  4. Right atrial size was mild to moderately dilated.  5. The mitral valve is normal in structure. No evidence of mitral valve regurgitation. No evidence of mitral stenosis.  6. The aortic valve is normal in structure. Aortic valve regurgitation is not visualized. No aortic stenosis is present.  7. The inferior vena cava is normal in size with greater than 50% respiratory variability, suggesting right atrial  pressure of 3 mmHg. FINDINGS  Left Ventricle: Left ventricular ejection fraction, by estimation, is 55 to 60%. The left ventricle has normal function. The left ventricle has no regional wall motion abnormalities. The left ventricular internal cavity size was normal in size. There is  mild concentric left ventricular hypertrophy. Left ventricular  diastolic parameters are consistent with Grade I diastolic dysfunction (impaired relaxation). Right Ventricle: The right ventricular size is normal. No increase in right ventricular wall thickness. Right ventricular systolic function is normal. Left Atrium: Left atrial size was mild to moderately dilated. Right Atrium: Right atrial size was mild to moderately dilated. Pericardium: There is no evidence of pericardial effusion. Mitral Valve: The mitral valve is normal in structure. No evidence of mitral valve regurgitation. No evidence of mitral valve stenosis. Tricuspid Valve: The tricuspid valve is normal in structure. Tricuspid valve regurgitation is trivial. No evidence of tricuspid stenosis. Aortic Valve: The aortic valve is normal in structure. Aortic valve regurgitation is not visualized. No aortic stenosis is present. Aortic valve peak gradient measures 8.9 mmHg. Pulmonic Valve: The pulmonic valve was normal in structure. Pulmonic valve regurgitation is not visualized. No evidence of pulmonic stenosis. Aorta: The aortic root is normal in size and structure. Venous: The inferior vena cava is normal in size with greater than 50% respiratory variability, suggesting right atrial pressure of 3 mmHg. IAS/Shunts: No atrial level shunt detected by color flow Doppler.  LEFT VENTRICLE PLAX 2D LVIDd:         3.88 cm Diastology LVIDs:         2.71 cm LV e' medial:    7.51 cm/s LV PW:         1.12 cm LV E/e' medial:  11.1 LV IVS:        1.14 cm LV e' lateral:   8.59 cm/s                        LV E/e' lateral: 9.7  RIGHT VENTRICLE RV Basal diam:  3.80 cm TAPSE (M-mode): 1.9 cm LEFT  ATRIUM             Index        RIGHT ATRIUM          Index LA Vol (A2C):   34.8 ml 19.28 ml/m  RA Area:     8.89 cm LA Vol (A4C):   17.9 ml 9.92 ml/m   RA Volume:   19.40 ml 10.75 ml/m LA Biplane Vol: 26.9 ml 14.90 ml/m  AORTIC VALVE AV Vmax:      149.00 cm/s AV Peak Grad: 8.9 mmHg LVOT Vmax:    140.00 cm/s LVOT Vmean:   90.400 cm/s LVOT VTI:     0.265 m MITRAL VALVE MV Area (PHT): 3.89 cm    SHUNTS MV Decel Time: 195 msec    Systemic VTI: 0.26 m MV E velocity: 83.60 cm/s MV A velocity: 82.70 cm/s MV E/A ratio:  1.01 Shaukat Khan Electronically signed by Adrian Blackwater Signature Date/Time: 06/23/2021/8:57:53 AM    Final       Subjective: Has no shortness of breath or chest pain this AM.  No dizziness or headaches.  No complaints overall  Discharge Exam: Vitals:   06/27/21 1207 06/27/21 1225  BP: (!) 123/59   Pulse: (!) 58   Resp: 18 18  Temp: 97.6 F (36.4 C)   SpO2: 92%    Vitals:   06/27/21 0908 06/27/21 1000 06/27/21 1207 06/27/21 1225  BP:   (!) 123/59   Pulse:   (!) 58   Resp: 18 20 18 18   Temp:   97.6 F (36.4 C)   TempSrc:      SpO2:   92%   Weight:      Height:  General: Pt is alert, awake, not in acute distress Cardiovascular: RRR, S1/S2 +, no rubs, no gallops Respiratory: CTA bilaterally, no wheezing, no rhonchi Abdominal: Soft, NT, ND, bowel sounds + Extremities: no edema, no cyanosis    The results of significant diagnostics from this hospitalization (including imaging, microbiology, ancillary and laboratory) are listed below for reference.     Microbiology: Recent Results (from the past 240 hour(s))  Resp Panel by RT-PCR (Flu A&B, Covid) Nasopharyngeal Swab     Status: None   Collection Time: 06/22/21  5:39 PM   Specimen: Nasopharyngeal Swab; Nasopharyngeal(NP) swabs in vial transport medium  Result Value Ref Range Status   SARS Coronavirus 2 by RT PCR NEGATIVE NEGATIVE Final    Comment: (NOTE) SARS-CoV-2 target nucleic acids are NOT  DETECTED.  The SARS-CoV-2 RNA is generally detectable in upper respiratory specimens during the acute phase of infection. The lowest concentration of SARS-CoV-2 viral copies this assay can detect is 138 copies/mL. A negative result does not preclude SARS-Cov-2 infection and should not be used as the sole basis for treatment or other patient management decisions. A negative result may occur with  improper specimen collection/handling, submission of specimen other than nasopharyngeal swab, presence of viral mutation(s) within the areas targeted by this assay, and inadequate number of viral copies(<138 copies/mL). A negative result must be combined with clinical observations, patient history, and epidemiological information. The expected result is Negative.  Fact Sheet for Patients:  BloggerCourse.com  Fact Sheet for Healthcare Providers:  SeriousBroker.it  This test is no t yet approved or cleared by the [place] FDA and  has been authorized for detection and/or diagnosis of SARS-CoV-2 by FDA under an Emergency Use Authorization (EUA). This EUA will remain  in effect (meaning this test can be used) for the duration of the COVID-19 declaration under Section 564(b)(1) of the Act, 21 U.S.C.section 360bbb-3(b)(1), unless the authorization is terminated  or revoked sooner.       Influenza A by PCR NEGATIVE NEGATIVE Final   Influenza B by PCR NEGATIVE NEGATIVE Final    Comment: (NOTE) The Xpert Xpress SARS-CoV-2/FLU/RSV plus assay is intended as an aid in the diagnosis of influenza from Nasopharyngeal swab specimens and should not be used as a sole basis for treatment. Nasal washings and aspirates are unacceptable for Xpert Xpress SARS-CoV-2/FLU/RSV testing.  Fact Sheet for Patients: BloggerCourse.com  Fact Sheet for Healthcare Providers: SeriousBroker.it  This test is not yet  approved or cleared by the [place] FDA and has been authorized for detection and/or diagnosis of SARS-CoV-2 by FDA under an Emergency Use Authorization (EUA). This EUA will remain in effect (meaning this test can be used) for the duration of the COVID-19 declaration under Section 564(b)(1) of the Act, 21 U.S.C. section 360bbb-3(b)(1), unless the authorization is terminated or revoked.  Performed at [facility], [address] [place], [place] [address]   Blood Culture (routine x 2)     Status: None   Collection Time: 06/22/21  5:39 PM   Specimen: BLOOD  Result Value Ref Range Status   Specimen Description BLOOD BLOOD RIGHT FOREARM  Final   Special Requests   Final    BOTTLES DRAWN AEROBIC AND ANAEROBIC Blood Culture results may not be optimal due to an inadequate volume of blood received in culture bottles   Culture   Final    NO GROWTH 5 DAYS Performed at [facility], [address] [place], [place] [address]    Report Status 06/27/2021  FINAL  Final  Blood Culture (routine x 2)     Status: None   Collection Time: 06/22/21  5:39 PM   Specimen: BLOOD  Result Value Ref Range Status   Specimen Description BLOOD LEFT ANTECUBITAL  Final   Special Requests   Final    BOTTLES DRAWN AEROBIC AND ANAEROBIC Blood Culture adequate volume   Culture   Final    NO GROWTH 5 DAYS Performed at [facility], [address], [place], [place] [address]    Report Status 06/27/2021 FINAL  Final  Urine Culture     Status: None   Collection Time: 06/22/21  5:39 PM   Specimen: In/Out Cath Urine  Result Value Ref Range Status   Specimen Description   Final    IN/OUT CATH URINE Performed at [facility], [address] [place], [place] [address]    Special Requests   Final    NONE Performed at [facility], [address], [place], [place] [address]    Culture   Final    NO GROWTH Performed at [facility], [address],  [place], [place] [address]    Report Status 06/24/2021 FINAL  Final  Respiratory (~20 pathogens) panel by PCR     Status: Abnormal   Collection Time: 06/22/21  5:39 PM   Specimen: Nasopharyngeal Swab; Respiratory  Result Value Ref Range Status   Adenovirus NOT DETECTED NOT DETECTED Final   Coronavirus 229E NOT DETECTED NOT DETECTED Final    Comment: (NOTE) The Coronavirus on the Respiratory Panel, DOES NOT test for the novel  Coronavirus (2019 nCoV)    Coronavirus HKU1 NOT DETECTED NOT DETECTED Final   Coronavirus NL63 NOT DETECTED NOT DETECTED Final   Coronavirus OC43 NOT DETECTED NOT DETECTED Final   Metapneumovirus NOT DETECTED NOT DETECTED Final   Rhinovirus / Enterovirus NOT DETECTED NOT DETECTED Final   Influenza A NOT DETECTED NOT DETECTED Final   Influenza B NOT DETECTED NOT DETECTED Final   Parainfluenza Virus 1 NOT DETECTED NOT DETECTED Final   Parainfluenza Virus 2 NOT DETECTED NOT DETECTED Final   Parainfluenza Virus 3 NOT DETECTED NOT DETECTED Final   Parainfluenza Virus 4 NOT DETECTED NOT DETECTED Final   Respiratory Syncytial Virus DETECTED (A) NOT DETECTED Final   Bordetella pertussis NOT DETECTED NOT DETECTED Final   Bordetella Parapertussis NOT DETECTED NOT DETECTED Final   Chlamydophila pneumoniae NOT DETECTED NOT DETECTED Final   Mycoplasma pneumoniae NOT DETECTED NOT DETECTED Final    Comment: Performed at [facility], [address], [place], [place] [address]     Labs: BNP (last 3 results) No results for input(s): BNP in the last 8760 hours. Basic Metabolic Panel: Recent Labs  Lab 06/22/21 1635 06/23/21 0251 06/25/21 0746  NA 133* 132* 139  K 4.4 4.8 3.7  CL 97* 97* 100  CO2 25 28 32  GLUCOSE 121* 122* 141*  BUN 21 16 13   CREATININE 0.99 0.82 0.80  CALCIUM 8.8* 8.1* 9.0   Liver Function Tests: No results for input(s): AST, ALT, ALKPHOS, BILITOT, PROT, ALBUMIN in the last 168 hours. No results for input(s): LIPASE, AMYLASE in the last 168  hours. No results for input(s): AMMONIA in the last 168 hours. CBC: Recent Labs  Lab 06/22/21 1635 06/23/21 0251 06/25/21 0746  WBC 7.8 10.0 8.4  NEUTROABS  --   --  7.7  HGB 12.5 11.3* 11.2*  HCT 36.3 33.1* 33.1*  MCV 90.8 93.0 88.0  PLT 183 138* 144*   Cardiac Enzymes: No results for input(s): CKTOTAL, CKMB, CKMBINDEX, TROPONINI in the last 168 hours. BNP: Invalid input(s): POCBNP CBG: No results for input(s): GLUCAP in the last 168 hours. D-Dimer No results for input(s): DDIMER in the last 72 hours. Hgb A1c No results for input(s): HGBA1C in the last 72 hours. Lipid Profile No results for input(s): CHOL, HDL, LDLCALC, TRIG, CHOLHDL, LDLDIRECT in the last 72 hours. Thyroid function studies No results for input(s): TSH, T4TOTAL, T3FREE, THYROIDAB in the last 72 hours.  Invalid input(s): FREET3 Anemia work up No results for input(s): VITAMINB12, FOLATE, FERRITIN, TIBC, IRON, RETICCTPCT in the last 72 hours. Urinalysis    Component Value Date/Time   COLORURINE YELLOW (A) 06/22/2021 1739   APPEARANCEUR CLEAR (A) 06/22/2021 1739   LABSPEC >1.046 (H) 06/22/2021 1739   PHURINE 6.0 06/22/2021 1739   GLUCOSEU NEGATIVE 06/22/2021 1739   HGBUR MODERATE (A) 06/22/2021 1739   BILIRUBINUR NEGATIVE 06/22/2021 1739   BILIRUBINUR Small 04/07/2020 1525   KETONESUR 5 (A) 06/22/2021 1739   PROTEINUR 30 (A) 06/22/2021 1739   UROBILINOGEN 0.2 04/07/2020 1525   NITRITE NEGATIVE 06/22/2021 1739   LEUKOCYTESUR NEGATIVE 06/22/2021 1739   Sepsis Labs Invalid input(s): PROCALCITONIN,  WBC,  LACTICIDVEN Microbiology Recent Results (from the past 240 hour(s))  Resp Panel by RT-PCR (Flu A&B, Covid) Nasopharyngeal Swab     Status: None   Collection Time: 06/22/21  5:39 PM   Specimen: Nasopharyngeal Swab; Nasopharyngeal(NP) swabs in vial transport medium  Result Value Ref Range Status   SARS Coronavirus 2 by RT PCR NEGATIVE NEGATIVE Final    Comment: (NOTE) SARS-CoV-2 target nucleic  acids are NOT DETECTED.  The SARS-CoV-2 RNA is generally detectable in upper respiratory specimens during the acute phase of infection. The lowest concentration of SARS-CoV-2 viral copies this assay can detect is 138 copies/mL. A negative result does not preclude SARS-Cov-2 infection and should not be used as the sole basis for treatment or other patient management decisions. A negative result may occur with  improper specimen collection/handling, submission of specimen other than nasopharyngeal swab, presence of viral mutation(s) within the areas targeted by this assay, and inadequate number of viral copies(<138 copies/mL). A negative result must be combined with clinical observations, patient history, and epidemiological information. The expected result is Negative.  Fact Sheet for Patients:  BloggerCourse.com  Fact Sheet for Healthcare Providers:  SeriousBroker.it  This test is no t yet approved or cleared by the [place] FDA and  has been authorized for detection and/or diagnosis of SARS-CoV-2 by FDA under an Emergency Use Authorization (EUA). This EUA will remain  in effect (meaning this test can be used) for the duration of the COVID-19 declaration under Section 564(b)(1) of the Act, 21 U.S.C.section 360bbb-3(b)(1), unless the authorization is terminated  or revoked sooner.       Influenza A by PCR NEGATIVE NEGATIVE Final   Influenza B by PCR NEGATIVE NEGATIVE Final    Comment: (NOTE) The Xpert Xpress SARS-CoV-2/FLU/RSV plus assay is intended as an aid in the diagnosis of influenza from Nasopharyngeal swab specimens and should not be used as a sole basis for treatment. Nasal washings and aspirates are unacceptable for Xpert Xpress SARS-CoV-2/FLU/RSV testing.  Fact Sheet for Patients: BloggerCourse.com  Fact Sheet for Healthcare Providers: SeriousBroker.it  This  test is not yet approved or cleared by the [place] FDA and has been authorized for detection and/or diagnosis of SARS-CoV-2 by FDA under an Emergency Use Authorization (EUA).  This EUA will remain in effect (meaning this test can be used) for the duration of the COVID-19 declaration under Section 564(b)(1) of the Act, 21 U.S.C. section 360bbb-3(b)(1), unless the authorization is terminated or revoked.  Performed at [facility], [address] [place], [place] [address]   Blood Culture (routine x 2)     Status: None   Collection Time: 06/22/21  5:39 PM   Specimen: BLOOD  Result Value Ref Range Status   Specimen Description BLOOD BLOOD RIGHT FOREARM  Final   Special Requests   Final    BOTTLES DRAWN AEROBIC AND ANAEROBIC Blood Culture results may not be optimal due to an inadequate volume of blood received in culture bottles   Culture   Final    NO GROWTH 5 DAYS Performed at [facility], [address] [place], [place] [address]    Report Status 06/27/2021 FINAL  Final  Blood Culture (routine x 2)     Status: None   Collection Time: 06/22/21  5:39 PM   Specimen: BLOOD  Result Value Ref Range Status   Specimen Description BLOOD LEFT ANTECUBITAL  Final   Special Requests   Final    BOTTLES DRAWN AEROBIC AND ANAEROBIC Blood Culture adequate volume   Culture   Final    NO GROWTH 5 DAYS Performed at [facility], [address], [place], [place] [address]    Report Status 06/27/2021 FINAL  Final  Urine Culture     Status: None   Collection Time: 06/22/21  5:39 PM   Specimen: In/Out Cath Urine  Result Value Ref Range Status   Specimen Description   Final    IN/OUT CATH URINE Performed at [facility], [address] [place], [place] [address]    Special Requests   Final    NONE Performed at [facility], [address] [place], [place] [address]    Culture   Final    NO GROWTH Performed at [facility],  [address], [place], [place] [address]    Report Status 06/24/2021 FINAL  Final  Respiratory (~20 pathogens) panel by PCR     Status: Abnormal   Collection Time: 06/22/21  5:39 PM   Specimen: Nasopharyngeal Swab; Respiratory  Result Value Ref Range Status   Adenovirus NOT DETECTED NOT DETECTED Final   Coronavirus 229E NOT DETECTED NOT DETECTED Final    Comment: (NOTE) The Coronavirus on the Respiratory Panel, DOES NOT test for the novel  Coronavirus (2019 nCoV)    Coronavirus HKU1 NOT DETECTED NOT DETECTED Final   Coronavirus NL63 NOT DETECTED NOT DETECTED Final   Coronavirus OC43 NOT DETECTED NOT DETECTED Final   Metapneumovirus NOT DETECTED NOT DETECTED Final   Rhinovirus / Enterovirus NOT DETECTED NOT DETECTED Final   Influenza A NOT DETECTED NOT DETECTED Final   Influenza B NOT DETECTED NOT DETECTED Final   Parainfluenza Virus 1 NOT DETECTED NOT DETECTED Final   Parainfluenza Virus 2 NOT DETECTED NOT DETECTED Final   Parainfluenza Virus 3 NOT DETECTED NOT DETECTED Final   Parainfluenza Virus 4 NOT DETECTED NOT DETECTED Final   Respiratory Syncytial Virus DETECTED (A) NOT DETECTED Final   Bordetella pertussis NOT DETECTED NOT DETECTED Final   Bordetella Parapertussis NOT DETECTED NOT DETECTED Final   Chlamydophila pneumoniae NOT DETECTED NOT DETECTED Final   Mycoplasma pneumoniae NOT DETECTED NOT DETECTED Final    Comment: Performed at [facility], [address], [place], [place] [address]  Time coordinating discharge: Over 30 minutes  SIGNED:   Lynn Ito, MD  [facility] 06/27/2021, 1:08 PM Pager   If 7PM-7AM, please contact night-coverage [email or web address] Password [email or web address]

## 2021-06-27 NOTE — Care Management Important Message (Signed)
Important Message  Patient Details  Name: Morgan Abbott MRN: [id number] Date of Birth: 1945-10-06   Medicare Important Message Given:  Yes (spoke with via telephone, explained letter, no additional copy needed.)     Corey Harold 06/27/2021, 1:41 PM

## 2021-06-27 NOTE — TOC Transition Note (Signed)
Transition of Care [facility]) - CM/SW Discharge Note   Patient Details  Name: Morgan Abbott MRN: [id number] Date of Birth: 06/14/1946  [facility] CM/SW Contact:  Chapman Fitch, RN Phone Number: 06/27/2021, 12:25 PM   Clinical Narrative:    Patient to discharge today Barbara Cower with [facility] notified of discharge Referral made to [facility] with [employer]. To be delivered to room prior to discharge  Family to transport    Final next level of care: Home w Home Health Services Barriers to Discharge: Barriers Resolved   Patient Goals and CMS Choice        Discharge Placement                       Discharge Plan and Services   Discharge Planning Services: CM Consult                      HH Arranged: PT [facility] Agency: [facility] Date [facility] Agency Contacted: 06/27/21   Representative spoke with at [facility] Agency: Barbara Cower  Social Determinants of Health (SDOH) Interventions     Readmission Risk Interventions No flowsheet data found.

## 2021-06-28 ENCOUNTER — Telehealth: Payer: Self-pay

## 2021-06-28 DIAGNOSIS — J069 Acute upper respiratory infection, unspecified: Secondary | ICD-10-CM | POA: Diagnosis not present

## 2021-06-28 DIAGNOSIS — G8929 Other chronic pain: Secondary | ICD-10-CM | POA: Diagnosis not present

## 2021-06-28 DIAGNOSIS — Z9181 History of falling: Secondary | ICD-10-CM | POA: Diagnosis not present

## 2021-06-28 DIAGNOSIS — G43909 Migraine, unspecified, not intractable, without status migrainosus: Secondary | ICD-10-CM | POA: Diagnosis not present

## 2021-06-28 DIAGNOSIS — M1990 Unspecified osteoarthritis, unspecified site: Secondary | ICD-10-CM | POA: Diagnosis not present

## 2021-06-28 DIAGNOSIS — F411 Generalized anxiety disorder: Secondary | ICD-10-CM | POA: Diagnosis not present

## 2021-06-28 DIAGNOSIS — E785 Hyperlipidemia, unspecified: Secondary | ICD-10-CM | POA: Diagnosis not present

## 2021-06-28 DIAGNOSIS — Z85038 Personal history of other malignant neoplasm of large intestine: Secondary | ICD-10-CM | POA: Diagnosis not present

## 2021-06-28 DIAGNOSIS — F32A Depression, unspecified: Secondary | ICD-10-CM | POA: Diagnosis not present

## 2021-06-28 DIAGNOSIS — J441 Chronic obstructive pulmonary disease with (acute) exacerbation: Secondary | ICD-10-CM | POA: Diagnosis not present

## 2021-06-28 DIAGNOSIS — B974 Respiratory syncytial virus as the cause of diseases classified elsewhere: Secondary | ICD-10-CM | POA: Diagnosis not present

## 2021-06-28 DIAGNOSIS — K219 Gastro-esophageal reflux disease without esophagitis: Secondary | ICD-10-CM | POA: Diagnosis not present

## 2021-06-28 DIAGNOSIS — J9601 Acute respiratory failure with hypoxia: Secondary | ICD-10-CM | POA: Diagnosis not present

## 2021-06-28 DIAGNOSIS — Z79891 Long term (current) use of opiate analgesic: Secondary | ICD-10-CM | POA: Diagnosis not present

## 2021-06-28 DIAGNOSIS — Z7952 Long term (current) use of systemic steroids: Secondary | ICD-10-CM | POA: Diagnosis not present

## 2021-06-28 NOTE — Telephone Encounter (Signed)
Left vm for patient to return call to schedule 06/27/21 hospital discharge-Toni

## 2021-06-28 NOTE — Telephone Encounter (Signed)
Gave verbal order to [facility]  kim [phone]  for nursing  for once a week for 8 week and 2 prn

## 2021-07-01 ENCOUNTER — Other Ambulatory Visit: Payer: Self-pay

## 2021-07-01 DIAGNOSIS — Z1231 Encounter for screening mammogram for malignant neoplasm of breast: Secondary | ICD-10-CM

## 2021-07-03 NOTE — Telephone Encounter (Signed)
Patient scheduled with [facility] 07/17/21 @ 12:30-Morgan Abbott

## 2021-07-05 ENCOUNTER — Other Ambulatory Visit: Payer: Self-pay

## 2021-07-05 ENCOUNTER — Ambulatory Visit (INDEPENDENT_AMBULATORY_CARE_PROVIDER_SITE_OTHER): Payer: Medicare Other | Admitting: Nurse Practitioner

## 2021-07-05 ENCOUNTER — Encounter: Payer: Self-pay | Admitting: Nurse Practitioner

## 2021-07-05 VITALS — BP 111/69 | HR 100 | Temp 98.6°F | Resp 16 | Ht 63.0 in | Wt 164.8 lb

## 2021-07-05 DIAGNOSIS — F411 Generalized anxiety disorder: Secondary | ICD-10-CM

## 2021-07-05 DIAGNOSIS — D509 Iron deficiency anemia, unspecified: Secondary | ICD-10-CM | POA: Diagnosis not present

## 2021-07-05 DIAGNOSIS — G479 Sleep disorder, unspecified: Secondary | ICD-10-CM

## 2021-07-05 DIAGNOSIS — J9601 Acute respiratory failure with hypoxia: Secondary | ICD-10-CM | POA: Diagnosis not present

## 2021-07-05 DIAGNOSIS — B338 Other specified viral diseases: Secondary | ICD-10-CM

## 2021-07-05 DIAGNOSIS — M064 Inflammatory polyarthropathy: Secondary | ICD-10-CM | POA: Diagnosis not present

## 2021-07-05 DIAGNOSIS — E871 Hypo-osmolality and hyponatremia: Secondary | ICD-10-CM

## 2021-07-05 MED ORDER — DIAZEPAM 5 MG PO TABS: 5.0000 mg | ORAL_TABLET | Freq: Two times a day (BID) | ORAL | 2 refills | Status: DC | PRN

## 2021-07-05 NOTE — Progress Notes (Addendum)
[facility] [address] [place] [id number] [facility] Discharge Acute Issues Care Follow Up                                                                        Patient Demographics  Morgan Abbott, is a 75 y.o. female  DOB 07/29/1946  MRN [id number].  Primary Care Provider:  Jonetta Osgood, NP   Reason for TCC follow Up - Acute hypoxic respiratory failure; RSV infection   Past Medical History:  Diagnosis Date   Allergic rhinitis    Arthritis    Bronchitis    Hyperlipidemia    Migraines    RSV (acute bronchiolitis due to respiratory syncytial virus)     Past Surgical History:  Procedure Laterality Date   CHOLECYSTECTOMY  1996   COLON RESECTION     dilatation and curettage     TONSILLECTOMY Bilateral        Recent HPI and Hospital Course  Morgan Abbott is a 75 y.o. female with medical history significant for hx of remote colon cancer s/p colectomy, asthma, GERD, anxiety depression, chronic pain on opioids who presents with concerns of cough and congestion.   History provided mostly by granddaughter at bedside since patient was lethargic and ill-appearing.  Patient lives with her granddaughter and works in Data processing manager job.  About 2 days ago she was otherwise in her normal state of health and went to work but then that evening developed an acute wet cough with chest congestion.  Cough continued to get worse and she experienced headache.  She then went to urgent care and had chest x-ray that showed left-sided pleural effusion and presented here to the ED.  She denies any fever at home.  No nausea, vomiting or diarrhea.  Denies any neck stiffness or vision changes with her headache. However, granddaughter does note that, at baseline, she has some shortness of breath with exertion like going up the stairs or in hot weather.  No lower extremity edema.   ED Course:  She had a temperature of 100F, heart rate of 94, hypoxic down to 87% and was placed on 2 L.  CBC showed no leukocytosis or anemia.  Lactate of 1.3.  Sodium of 133, potassium of 4.4, chloride of 97, creatinine of 0.99, BG 121. Troponin was eleveated. EKG reviewed by ED provider showed normal sinus rhythm.  Chest x-ray was negative for any acute process.  CTA chest was negative for PE, pulmonary edema or pneumonia.  COVID and flu PCR negative. RSV was positive on respiratory panel.  CTA chest unrevealing for pneumonia, some atelectasis , No PE RVP positive for RSV Procalcitonin negative Antibiotics initially was started empirically and then stop stopped Placed on Prednisone short course as she was wheezing initially. Prednisone 40 mg a day to start today.  Limit to 5-day course Started on bronchodilators. Antitussives Weaned off 02 to RA.  [facility] Acute Care Issue to be followed in the Clinic  Respiratory syncytial virus infection  Acute hypoxic respiratory failure    Subjective:   Morgan Abbott today has shortness of breath and a slight lingering cough. She denies any acute distress or difficulty breathing. She denies headache, chest pain, abdominal pain, nausea, weakness, numbness or tingling. No headache, She has physical therapy and a home health nurse coming out to the house.    Assessment & Plan   1. Acute respiratory failure with hypoxia (HCC) Stable at this time, was transitioned off oxygen and back to room air prior to discharge.   2. RSV infection Treated in hospital.   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 Iron deficiency anemia, unspecified iron deficiency anemia type Reapt CBC - CBC with Differential/Platelet  5. Hyponatremia Repeat BMP - Basic Metabolic Panel (BMET)  6. Generalized  anxiety disorder Refill diaepam - diazepam (VALIUM) 5 MG tablet; Take 1 tablet (5 mg total) by mouth every 12 (twelve) hours as needed for anxiety. Or sleep  Dispense: 60 tablet; Refill: 2  7. Sleep disturbance Refill ordered - diazepam (VALIUM) 5 MG tablet; Take 1 tablet (5 mg total) by mouth every 12 (twelve) hours as needed for anxiety. Or sleep  Dispense: 60 tablet; Refill: 2   Reason for frequent admissions/ER visits: pneumonia, respiratory failure      Objective:   Vitals:   07/05/21 1434  BP: 111/69  Pulse: 100  Resp: 16  Temp: 98.6 F (37 C)  SpO2: 96%  Weight: 164 lb 12.8 oz (74.8 kg)  Height: 5' 3" (1.6 m)    Wt Readings from Last 3 Encounters:  07/12/21 163 lb 9.6 oz (74.2 kg)  07/05/21 164 lb 12.8 oz (74.8 kg)  06/23/21 170 lb 1.6 oz (77.2 kg)    Allergies as of 07/05/2021   No Known Allergies      Medication List        Accurate as of July 05, 2021 11:59 PM. If you have any questions, ask your nurse or doctor.          albuterol 108 (90 Base) MCG/ACT inhaler Commonly known as: VENTOLIN HFA Inhale 2 puffs into the lungs every 6 (six) hours as needed for up to 7 days for wheezing or shortness of breath.   betamethasone dipropionate 0.05 % cream Apply topically 2 (two) times daily.   dextromethorphan-guaiFENesin 30-600 MG 12hr tablet Commonly known as: MUCINEX DM Take 1 tablet by mouth 2 (two) times daily.   diazepam 5 MG tablet Commonly known as: VALIUM Take 1 tablet (5 mg total) by mouth every 12 (twelve) hours as needed for anxiety. Or sleep   ergocalciferol 1.25 MG (50000 UT) capsule Commonly known as: VITAMIN D2 Take 1 capsule (50,000 Units total) by mouth every 14 (fourteen) days.   omeprazole 40 MG capsule Commonly known as: PRILOSEC TAKE 1 CAPSULE (40 MG TOTAL) BY MOUTH DAILY.   oxyCODONE-acetaminophen 10-325 MG tablet Commonly known as: PERCOCET Take by mouth. 1 TAB BY MOUTH EVERY 12 HRS X 10 DAYS, 1 TAB EVERY 8 HRS X 10  DAYS THEN 1 TAB EVERY 6 HRS X 10 DAYS   sertraline 100 MG tablet Commonly known as: ZOLOFT TAKE 1 TABLET BY MOUTH EVERY DAY         Physical Exam: Constitutional: Patient appears well-developed and well-nourished. Not in obvious distress. HENT: Normocephalic, atraumatic, External right and left ear normal. Oropharynx  is clear and moist.  Eyes: Conjunctivae and EOM are normal. PERRLA, no scleral icterus. Neck: Normal ROM. Neck supple. No JVD. No tracheal deviation. No thyromegaly. CVS: RRR, S1/S2 +, no murmurs, no gallops, no carotid bruit.  Pulmonary: Effort and breath sounds abnormal, wheezing noted in the upper lung lobes bilaterally. no stridor, rhonchi, rales noted.  Abdominal: Soft. BS +, no distension, tenderness, rebound or guarding.  Musculoskeletal: Normal range of motion. No edema and no tenderness.  Lymphadenopathy: No lymphadenopathy noted, cervical, inguinal or axillary Neuro: Alert. Normal reflexes, muscle tone coordination. No cranial nerve deficit. Skin: Skin is warm and dry. No rash noted. Not diaphoretic. No erythema. No pallor. Psychiatric: Normal mood and affect. Behavior, judgment, thought content normal.   Data Review   Micro Results No results found for this or any previous visit (from the past 240 hour(s)).   CBC No results for input(s): WBC, HGB, HCT, PLT, MCV, MCH, MCHC, RDW, LYMPHSABS, MONOABS, EOSABS, BASOSABS, BANDABS in the last 168 hours.  Invalid input(s): NEUTRABS, BANDSABD  Chemistries  No results for input(s): NA, K, CL, CO2, GLUCOSE, BUN, CREATININE, CALCIUM, MG, AST, ALT, ALKPHOS, BILITOT in the last 168 hours.  Invalid input(s): GFRCGP ------------------------------------------------------------------------------------------------------------------ CrCl cannot be calculated (Patient's most recent lab result is older than the maximum 21 days  allowed.). ------------------------------------------------------------------------------------------------------------------ No results for input(s): HGBA1C in the last 72 hours. ------------------------------------------------------------------------------------------------------------------ No results for input(s): CHOL, HDL, LDLCALC, TRIG, CHOLHDL, LDLDIRECT in the last 72 hours. ------------------------------------------------------------------------------------------------------------------ No results for input(s): TSH, T4TOTAL, T3FREE, THYROIDAB in the last 72 hours.  Invalid input(s): FREET3 ------------------------------------------------------------------------------------------------------------------ No results for input(s): VITAMINB12, FOLATE, FERRITIN, TIBC, IRON, RETICCTPCT in the last 72 hours.  Coagulation profile No results for input(s): INR, PROTIME in the last 168 hours.  No results for input(s): DDIMER in the last 72 hours.  Cardiac Enzymes No results for input(s): CKMB, TROPONINI, MYOGLOBIN in the last 168 hours.  Invalid input(s): CK ------------------------------------------------------------------------------------------------------------------ Invalid input(s): POCBNP  Return in about 1 month (around 08/05/2021) for F/U, Alyssa PCP.  Time Spent in minutes  45 Time spent with patient included reviewing progress notes, labs, imaging studies, and discussing plan for follow up.   This patient was seen by Jonetta Osgood, FNP-C in collaboration with Dr. Clayborn Bigness as a part of collaborative care agreement.  Jonetta Osgood MSN, FNP-C on 07/24/2021 at 6:06 AM   **Disclaimer: This note may have been dictated with voice recognition software. Similar sounding words can inadvertently be transcribed and this note may contain transcription errors which may not have been corrected upon publication of note.**

## 2021-07-12 ENCOUNTER — Ambulatory Visit: Payer: Medicare Other | Admitting: Nurse Practitioner

## 2021-07-12 ENCOUNTER — Ambulatory Visit (INDEPENDENT_AMBULATORY_CARE_PROVIDER_SITE_OTHER): Payer: Medicare Other | Admitting: Nurse Practitioner

## 2021-07-12 ENCOUNTER — Other Ambulatory Visit: Payer: Self-pay

## 2021-07-12 ENCOUNTER — Encounter: Payer: Self-pay | Admitting: Nurse Practitioner

## 2021-07-12 VITALS — BP 133/70 | HR 100 | Temp 98.3°F | Resp 16 | Ht 63.0 in | Wt 163.6 lb

## 2021-07-12 DIAGNOSIS — B338 Other specified viral diseases: Secondary | ICD-10-CM

## 2021-07-12 DIAGNOSIS — J9601 Acute respiratory failure with hypoxia: Secondary | ICD-10-CM | POA: Diagnosis not present

## 2021-07-12 DIAGNOSIS — M064 Inflammatory polyarthropathy: Secondary | ICD-10-CM

## 2021-07-12 NOTE — Progress Notes (Signed)
[facility] [address] [place], [place] [address]  Internal MEDICINE  Office Visit Note  Patient Name: Morgan Abbott  [id number]  [id number]  Date of Service: 07/12/2021  Chief Complaint  Patient presents with   Follow-up    Wants to return to work, wants to have her lungs listened to    Hyperlipidemia    HPI Morgan Abbott presents for a follow up visit for discussing when to return to work. She was recently hospitalized for 5 days. She has been physically weak ever since and has physical therapy doing home visits at her house. She is worried her lungs sound worse again. She will need a note when she goes back to work but they have given her 90 day leave; PT told her she needs another weak or 2 at least.  She also went to the spine specialist in [place] she was referred to but they are expensive and sent her a large bill even after she was told that they take her medicare.  She still has not had her labs drawn that were ordered on 07/05/21. Patient reminded of this.    Current Medication: Outpatient Encounter Medications as of 07/12/2021  Medication Sig   betamethasone dipropionate 0.05 % cream Apply topically 2 (two) times daily.   dextromethorphan-guaiFENesin (MUCINEX DM) 30-600 MG 12hr tablet Take 1 tablet by mouth 2 (two) times daily.   diazepam (VALIUM) 5 MG tablet Take 1 tablet (5 mg total) by mouth every 12 (twelve) hours as needed for anxiety. Or sleep   ergocalciferol (VITAMIN D2) 1.25 MG (50000 UT) capsule Take 1 capsule (50,000 Units total) by mouth every 14 (fourteen) days.   omeprazole (PRILOSEC) 40 MG capsule TAKE 1 CAPSULE (40 MG TOTAL) BY MOUTH DAILY.   oxyCODONE-acetaminophen (PERCOCET) 10-325 MG tablet Take by mouth. 1 TAB BY MOUTH EVERY 12 HRS X 10 DAYS, 1 TAB EVERY 8 HRS X 10 DAYS THEN 1 TAB EVERY 6 HRS X 10 DAYS   sertraline (ZOLOFT) 100 MG tablet TAKE 1 TABLET BY MOUTH EVERY DAY   albuterol (VENTOLIN HFA) 108 (90 Base) MCG/ACT inhaler Inhale 2 puffs  into the lungs every 6 (six) hours as needed for up to 7 days for wheezing or shortness of breath.   No facility-administered encounter medications on file as of 07/12/2021.    Surgical History: Past Surgical History:  Procedure Laterality Date   CHOLECYSTECTOMY  1996   COLON RESECTION     dilatation and curettage     TONSILLECTOMY Bilateral     Medical History: Past Medical History:  Diagnosis Date   Allergic rhinitis    Arthritis    Bronchitis    Hyperlipidemia    Migraines    RSV (acute bronchiolitis due to respiratory syncytial virus)     Family History: Family History  Problem Relation Age of Onset   Hypertension Mother    Bladder Cancer Father        58 yrs old when passed away    Social History   Socioeconomic History   Marital status: Married    Spouse name: Not on file   Number of children: Not on file   Years of education: Not on file   Highest education level: Not on file  Occupational History   Not on file  Tobacco Use   Smoking status: Never   Smokeless tobacco: Never  Substance and Sexual Activity   Alcohol use: No   Drug use: No   Sexual activity: Not on file  Other Topics Concern   Not on file  Social History Narrative   Not on file   Social Determinants of Health   Financial Resource Strain: Not on file  Food Insecurity: Not on file  Transportation Needs: Not on file  Physical Activity: Not on file  Stress: Not on file  Social Connections: Not on file  Intimate Partner Violence: Not on file      Review of Systems  Constitutional:  Negative for chills, fatigue and unexpected weight change.  HENT:  Negative for congestion, rhinorrhea, sneezing and sore throat.   Eyes:  Negative for redness.  Respiratory:  Positive for cough (lingering), shortness of breath (intermittent) and wheezing (intermittent). Negative for chest tightness.   Cardiovascular:  Negative for chest pain and palpitations.  Gastrointestinal:  Negative for  abdominal pain, constipation, diarrhea, nausea and vomiting.  Genitourinary:  Negative for dysuria and frequency.  Musculoskeletal:  Negative for arthralgias, back pain, joint swelling and neck pain.  Skin:  Negative for rash.  Neurological: Negative.  Negative for tremors and numbness.  Hematological:  Negative for adenopathy. Does not bruise/bleed easily.  Psychiatric/Behavioral:  Negative for behavioral problems (Depression), sleep disturbance and suicidal ideas. The patient is not nervous/anxious.    Vital Signs: BP 133/70   Pulse 100   Temp 98.3 F (36.8 C)   Resp 16   Ht 5\' 3"  (1.6 m)   Wt 163 lb 9.6 oz (74.2 kg)   SpO2 96%   BMI 28.98 kg/m    Physical Exam Vitals reviewed.  Constitutional:      General: She is not in acute distress.    Appearance: Normal appearance. She is normal weight. She is not ill-appearing.  HENT:     Head: Normocephalic and atraumatic.  Eyes:     Pupils: Pupils are equal, round, and reactive to light.  Cardiovascular:     Rate and Rhythm: Normal rate and regular rhythm.  Pulmonary:     Effort: Pulmonary effort is normal. No respiratory distress.     Breath sounds: Normal breath sounds. No wheezing, rhonchi or rales.  Neurological:     Mental Status: She is alert and oriented to person, place, and time.     Cranial Nerves: No cranial nerve deficit.     Coordination: Coordination normal.     Gait: Gait normal.  Psychiatric:        Mood and Affect: Mood normal.        Behavior: Behavior normal.       Assessment/Plan: 1. Acute respiratory failure with hypoxia (HCC) Recent hospitalization, lungs are clear, patient has residual symptoms and remains on leave from work.   2. RSV infection Patient did have recent RSV infection that she was hospitalized for. Her lungs are clear but she is still on leave for work while recovering.   3. Inflammatory polyarthropathy (HCC) Severe chronic pain. Went to spine specialist but ended up with a large  bill. Will look for another specialist that takes her insurance.    General Counseling: Morgan Abbott understanding of the findings of todays visit and agrees with plan of treatment. I have discussed any further diagnostic evaluation that may be needed or ordered today. We also reviewed her medications today. she has been encouraged to call the office with any questions or concerns that should arise related to todays visit.    No orders of the defined types were placed in this encounter.   No orders of the defined types were placed in this encounter.  Return in about 1 month (around 08/12/2021) for F/U, Zaccheus Edmister PCP.   Total time spent:30 Minutes Time spent includes review of chart, medications, test results, and follow up plan with the patient.   [place] Controlled Substance Database was reviewed by me.  This patient was seen by Sallyanne Kuster, FNP-C in collaboration with Dr. Beverely Risen as a part of collaborative care agreement.   Vanetta Rule R. Tedd Sias, MSN, FNP-C Internal medicine

## 2021-07-17 ENCOUNTER — Ambulatory Visit: Payer: Medicare Other | Admitting: Nurse Practitioner

## 2021-07-24 ENCOUNTER — Encounter: Payer: Self-pay | Admitting: Nurse Practitioner

## 2021-07-28 DIAGNOSIS — E785 Hyperlipidemia, unspecified: Secondary | ICD-10-CM | POA: Diagnosis not present

## 2021-07-28 DIAGNOSIS — J441 Chronic obstructive pulmonary disease with (acute) exacerbation: Secondary | ICD-10-CM | POA: Diagnosis not present

## 2021-07-28 DIAGNOSIS — Z85038 Personal history of other malignant neoplasm of large intestine: Secondary | ICD-10-CM | POA: Diagnosis not present

## 2021-07-28 DIAGNOSIS — Z7952 Long term (current) use of systemic steroids: Secondary | ICD-10-CM | POA: Diagnosis not present

## 2021-07-28 DIAGNOSIS — F411 Generalized anxiety disorder: Secondary | ICD-10-CM | POA: Diagnosis not present

## 2021-07-28 DIAGNOSIS — J9601 Acute respiratory failure with hypoxia: Secondary | ICD-10-CM | POA: Diagnosis not present

## 2021-07-28 DIAGNOSIS — Z9181 History of falling: Secondary | ICD-10-CM | POA: Diagnosis not present

## 2021-07-28 DIAGNOSIS — G8929 Other chronic pain: Secondary | ICD-10-CM | POA: Diagnosis not present

## 2021-07-28 DIAGNOSIS — J069 Acute upper respiratory infection, unspecified: Secondary | ICD-10-CM | POA: Diagnosis not present

## 2021-07-28 DIAGNOSIS — M1990 Unspecified osteoarthritis, unspecified site: Secondary | ICD-10-CM | POA: Diagnosis not present

## 2021-07-28 DIAGNOSIS — Z79891 Long term (current) use of opiate analgesic: Secondary | ICD-10-CM | POA: Diagnosis not present

## 2021-07-28 DIAGNOSIS — G43909 Migraine, unspecified, not intractable, without status migrainosus: Secondary | ICD-10-CM | POA: Diagnosis not present

## 2021-07-28 DIAGNOSIS — F32A Depression, unspecified: Secondary | ICD-10-CM | POA: Diagnosis not present

## 2021-07-28 DIAGNOSIS — B974 Respiratory syncytial virus as the cause of diseases classified elsewhere: Secondary | ICD-10-CM | POA: Diagnosis not present

## 2021-07-28 DIAGNOSIS — K219 Gastro-esophageal reflux disease without esophagitis: Secondary | ICD-10-CM | POA: Diagnosis not present

## 2021-08-12 DIAGNOSIS — R062 Wheezing: Secondary | ICD-10-CM | POA: Diagnosis not present

## 2021-08-12 DIAGNOSIS — U071 COVID-19: Secondary | ICD-10-CM | POA: Diagnosis not present

## 2021-08-13 ENCOUNTER — Encounter: Payer: Self-pay | Admitting: Nurse Practitioner

## 2021-08-13 ENCOUNTER — Other Ambulatory Visit: Payer: Self-pay

## 2021-08-13 ENCOUNTER — Telehealth (INDEPENDENT_AMBULATORY_CARE_PROVIDER_SITE_OTHER): Payer: Medicare Other | Admitting: Nurse Practitioner

## 2021-08-13 VITALS — HR 85 | Temp 97.8°F | Resp 16 | Ht 63.0 in | Wt 162.0 lb

## 2021-08-13 DIAGNOSIS — R051 Acute cough: Secondary | ICD-10-CM | POA: Diagnosis not present

## 2021-08-13 DIAGNOSIS — U071 COVID-19: Secondary | ICD-10-CM | POA: Diagnosis not present

## 2021-08-13 DIAGNOSIS — J069 Acute upper respiratory infection, unspecified: Secondary | ICD-10-CM | POA: Diagnosis not present

## 2021-08-13 MED ORDER — HYDROCOD POLST-CPM POLST ER 10-8 MG/5ML PO SUER: 5.0000 mL | Freq: Two times a day (BID) | ORAL | 0 refills | Status: DC | PRN

## 2021-08-13 NOTE — Progress Notes (Signed)
[facility] [address] [place], [place] [address]  Internal MEDICINE  Telephone Visit  Patient Name: Morgan Abbott  [id number]  [id number]  Date of Service: 08/13/2021  I connected with the patient at 11:15 AM by telephone and verified the patients identity using two identifiers.   I discussed the limitations, risks, security and privacy concerns of performing an evaluation and management service by telephone and the availability of in person appointments. I also discussed with the patient that there may be a patient responsible charge related to the service.  The patient expressed understanding and agrees to proceed.    Chief Complaint  Patient presents with   Acute Visit    Tested pos. For covid 08-12-21, pt got meds from urgent care yesterday, has SOB yesterday   Telephone Assessment    [phone]    Telephone Screen    Video call   Cough   Headache   Sore Throat    HPI Morgan Abbott presents for a telehealth virtual visit for testing positive for covid on 08/12/21. She reports cough, headache, sore throat. She had SOB yesterday and went to urgent care. She has previously hospitalized with RSV. She received an IM injection of steroids and a prescription for paxlovid. She took paxlovid last night and got a bad headache. She reports that the cough syrup she was prescribed is not helping. She usually takes tussionex.    Current Medication: Outpatient Encounter Medications as of 08/13/2021  Medication Sig   betamethasone dipropionate 0.05 % cream Apply topically 2 (two) times daily.   brompheniramine-pseudoephedrine-DM 30-2-10 MG/5ML syrup Take by mouth.   chlorpheniramine-HYDROcodone (TUSSIONEX PENNKINETIC ER) 10-8 MG/5ML SUER Take 5 mLs by mouth every 12 (twelve) hours as needed for cough.   dextromethorphan-guaiFENesin (MUCINEX DM) 30-600 MG 12hr tablet Take 1 tablet by mouth 2 (two) times daily.   diazepam (VALIUM) 5 MG tablet Take 1 tablet (5 mg total) by mouth  every 12 (twelve) hours as needed for anxiety. Or sleep   ergocalciferol (VITAMIN D2) 1.25 MG (50000 UT) capsule Take 1 capsule (50,000 Units total) by mouth every 14 (fourteen) days.   omeprazole (PRILOSEC) 40 MG capsule TAKE 1 CAPSULE (40 MG TOTAL) BY MOUTH DAILY.   oxyCODONE-acetaminophen (PERCOCET) 10-325 MG tablet Take by mouth. 1 TAB BY MOUTH EVERY 12 HRS X 10 DAYS, 1 TAB EVERY 8 HRS X 10 DAYS THEN 1 TAB EVERY 6 HRS X 10 DAYS   PAXLOVID, 300/100, 20 x 150 MG & 10 x 100MG  TBPK Take by mouth.   sertraline (ZOLOFT) 100 MG tablet TAKE 1 TABLET BY MOUTH EVERY DAY   albuterol (VENTOLIN HFA) 108 (90 Base) MCG/ACT inhaler Inhale 2 puffs into the lungs every 6 (six) hours as needed for up to 7 days for wheezing or shortness of breath.   No facility-administered encounter medications on file as of 08/13/2021.    Surgical History: Past Surgical History:  Procedure Laterality Date   CHOLECYSTECTOMY  1996   COLON RESECTION     dilatation and curettage     TONSILLECTOMY Bilateral     Medical History: Past Medical History:  Diagnosis Date   Allergic rhinitis    Arthritis    Bronchitis    Hyperlipidemia    Migraines    RSV (acute bronchiolitis due to respiratory syncytial virus)     Family History: Family History  Problem Relation Age of Onset   Hypertension Mother    Bladder Cancer Father        100  yrs old when passed away    Social History   Socioeconomic History   Marital status: Married    Spouse name: Not on file   Number of children: Not on file   Years of education: Not on file   Highest education level: Not on file  Occupational History   Not on file  Tobacco Use   Smoking status: Never   Smokeless tobacco: Never  Substance and Sexual Activity   Alcohol use: No   Drug use: No   Sexual activity: Not on file  Other Topics Concern   Not on file  Social History Narrative   Not on file   Social Determinants of Health   Financial Resource Strain: Not on file   Food Insecurity: Not on file  Transportation Needs: Not on file  Physical Activity: Not on file  Stress: Not on file  Social Connections: Not on file  Intimate Partner Violence: Not on file      Review of Systems  Constitutional:  Positive for chills, fatigue and fever.  HENT:  Positive for congestion, postnasal drip and sore throat. Negative for ear pain.   Eyes:  Negative for pain.  Respiratory:  Positive for cough, chest tightness, shortness of breath and wheezing.   Cardiovascular: Negative.  Negative for chest pain and palpitations.  Gastrointestinal: Negative.  Negative for abdominal pain, constipation, diarrhea, nausea and vomiting.  Musculoskeletal: Negative.  Negative for myalgias.  Skin:  Negative for rash.  Neurological:  Positive for headaches.   Vital Signs: Pulse 85   Temp 97.8 F (36.6 C)   Resp 16   Ht 5\' 3"  (1.6 m)   Wt 162 lb (73.5 kg)   SpO2 95%   BMI 28.70 kg/m    Observation/Objective: She is alert and oriented and engages in conversation appropriately. She does not appear to be in any acute distress over video call.     Assessment/Plan: 1. Upper respiratory tract infection due to COVID-19 virus Paxlovid was prescribed by the urgent care clinic. Patient was encouraged to continue to take the paxlovid due to her extensive history of respiratory illnesses. Patinet instructed to take OTC tylenol 30 min to 1 hour prior to taking paxlovid to see if it helps alleviate the headache.  - PAXLOVID, 300/100, 20 x 150 MG & 10 x 100MG  TBPK; Take by mouth.  2. Acute cough Tussionex prescribed for symptomatic treatment of cough - chlorpheniramine-HYDROcodone (TUSSIONEX PENNKINETIC ER) 10-8 MG/5ML SUER; Take 5 mLs by mouth every 12 (twelve) hours as needed for cough.  Dispense: 473 mL; Refill: 0   General Counseling: Morgan Abbott verbalizes understanding of the findings of today's phone visit and agrees with plan of treatment. I have discussed any further diagnostic  evaluation that may be needed or ordered today. We also reviewed her medications today. she has been encouraged to call the office with any questions or concerns that should arise related to todays visit.  Return if symptoms worsen or fail to improve.   No orders of the defined types were placed in this encounter.   Meds ordered this encounter  Medications   chlorpheniramine-HYDROcodone (TUSSIONEX PENNKINETIC ER) 10-8 MG/5ML SUER    Sig: Take 5 mLs by mouth every 12 (twelve) hours as needed for cough.    Dispense:  473 mL    Refill:  0    Time spent:10 Minutes Time spent with patient included reviewing progress notes, labs, imaging studies, and discussing plan for follow up.   Controlled Substance Database was  reviewed by me for overdose risk score (ORS) if appropriate.  This patient was seen by Sallyanne Kuster, FNP-C in collaboration with Dr. Beverely Risen as a part of collaborative care agreement.  Avel Ogawa R. Tedd Sias, MSN, FNP-C Internal medicine

## 2021-08-23 ENCOUNTER — Ambulatory Visit: Payer: Medicare Other | Admitting: Nurse Practitioner

## 2021-08-30 ENCOUNTER — Encounter: Payer: Self-pay | Admitting: Nurse Practitioner

## 2021-08-30 ENCOUNTER — Ambulatory Visit (INDEPENDENT_AMBULATORY_CARE_PROVIDER_SITE_OTHER): Payer: Medicare Other | Admitting: Nurse Practitioner

## 2021-08-30 ENCOUNTER — Other Ambulatory Visit: Payer: Self-pay

## 2021-08-30 VITALS — BP 136/73 | HR 92 | Temp 98.3°F | Resp 16 | Ht 63.0 in | Wt 155.0 lb

## 2021-08-30 DIAGNOSIS — Z0001 Encounter for general adult medical examination with abnormal findings: Secondary | ICD-10-CM

## 2021-08-30 DIAGNOSIS — E538 Deficiency of other specified B group vitamins: Secondary | ICD-10-CM

## 2021-08-30 DIAGNOSIS — E559 Vitamin D deficiency, unspecified: Secondary | ICD-10-CM

## 2021-08-30 DIAGNOSIS — M1711 Unilateral primary osteoarthritis, right knee: Secondary | ICD-10-CM

## 2021-08-30 DIAGNOSIS — M797 Fibromyalgia: Secondary | ICD-10-CM

## 2021-08-30 DIAGNOSIS — R300 Dysuria: Secondary | ICD-10-CM

## 2021-08-30 DIAGNOSIS — E782 Mixed hyperlipidemia: Secondary | ICD-10-CM

## 2021-08-30 DIAGNOSIS — M064 Inflammatory polyarthropathy: Secondary | ICD-10-CM

## 2021-08-30 DIAGNOSIS — D509 Iron deficiency anemia, unspecified: Secondary | ICD-10-CM

## 2021-08-30 NOTE — Progress Notes (Signed)
[facility] [address] [place], [place] [address]  Internal MEDICINE  Office Visit Note  Patient Name: Morgan Abbott  [id number]  [id number]  Date of Service: 08/30/2021  Chief Complaint  Patient presents with   Medicare Wellness    Long term symptoms of covid, congestion   Hyperlipidemia    HPI Morgan Abbott presents for an annual well visit and physical exam. She is a well appearing 75 yo female with chronic arthritic pain in multiple joints. She is in the process of finding a pain medicine provider. [facility]. She was originally referred to [facility] in [place] and they assured her that they take her insurance but then found out later that they do not take her insurance.  Routine mammogram has been ordered and patient will call to schedule the mammogram.  Reports long term symptoms of covid but was also hospitalized in the past few months for an RSV infection.  Is in need of repeat labs    Current Medication: Outpatient Encounter Medications as of 08/30/2021  Medication Sig   betamethasone dipropionate 0.05 % cream Apply topically 2 (two) times daily.   chlorpheniramine-HYDROcodone (TUSSIONEX PENNKINETIC ER) 10-8 MG/5ML SUER Take 5 mLs by mouth every 12 (twelve) hours as needed for cough.   dextromethorphan-guaiFENesin (MUCINEX DM) 30-600 MG 12hr tablet Take 1 tablet by mouth 2 (two) times daily.   diazepam (VALIUM) 5 MG tablet Take 1 tablet (5 mg total) by mouth every 12 (twelve) hours as needed for anxiety. Or sleep   ergocalciferol (VITAMIN D2) 1.25 MG (50000 UT) capsule Take 1 capsule (50,000 Units total) by mouth every 14 (fourteen) days.   meloxicam (MOBIC) 7.5 MG tablet Take 7.5 mg by mouth daily.   omeprazole (PRILOSEC) 40 MG capsule TAKE 1 CAPSULE (40 MG TOTAL) BY MOUTH DAILY.   oxyCODONE-acetaminophen (PERCOCET) 10-325 MG tablet Take by mouth. 1 TAB BY MOUTH EVERY 12 HRS X 10 DAYS, 1 TAB EVERY 8 HRS X 10 DAYS  THEN 1 TAB EVERY 6 HRS X 10 DAYS   PAXLOVID, 300/100, 20 x 150 MG & 10 x 100MG  TBPK Take by mouth.   sertraline (ZOLOFT) 100 MG tablet TAKE 1 TABLET BY MOUTH EVERY DAY   albuterol (VENTOLIN HFA) 108 (90 Base) MCG/ACT inhaler Inhale 2 puffs into the lungs every 6 (six) hours as needed for up to 7 days for wheezing or shortness of breath.   No facility-administered encounter medications on file as of 08/30/2021.    Surgical History: Past Surgical History:  Procedure Laterality Date   CHOLECYSTECTOMY  1996   COLON RESECTION     dilatation and curettage     TONSILLECTOMY Bilateral     Medical History: Past Medical History:  Diagnosis Date   Allergic rhinitis    Arthritis    Bronchitis    Hyperlipidemia    Migraines    RSV (acute bronchiolitis due to respiratory syncytial virus)     Family History: Family History  Problem Relation Age of Onset   Hypertension Mother    Bladder Cancer Father        66 yrs old when passed away    Social History   Socioeconomic History   Marital status: Married    Spouse name: Not on file   Number of children: Not on file   Years of education: Not on file   Highest education level: Not on file  Occupational History   Not on file  Tobacco  Use   Smoking status: Never   Smokeless tobacco: Never  Substance and Sexual Activity   Alcohol use: No   Drug use: No   Sexual activity: Not on file  Other Topics Concern   Not on file  Social History Narrative   Not on file   Social Determinants of Health   Financial Resource Strain: Not on file  Food Insecurity: Not on file  Transportation Needs: Not on file  Physical Activity: Not on file  Stress: Not on file  Social Connections: Not on file  Intimate Partner Violence: Not on file      Review of Systems  Constitutional:  Negative for activity change, appetite change, chills, fatigue, fever and unexpected weight change.  HENT: Negative.  Negative for congestion, ear pain,  rhinorrhea, sore throat and trouble swallowing.   Eyes: Negative.   Respiratory: Negative.  Negative for cough, chest tightness, shortness of breath and wheezing.   Cardiovascular: Negative.  Negative for chest pain.  Gastrointestinal: Negative.  Negative for abdominal pain, blood in stool, constipation, diarrhea, nausea and vomiting.  Endocrine: Negative.   Genitourinary: Negative.  Negative for difficulty urinating, dysuria, frequency, hematuria and urgency.  Musculoskeletal: Negative.  Negative for arthralgias, back pain, joint swelling, myalgias and neck pain.  Skin: Negative.  Negative for rash and wound.  Allergic/Immunologic: Negative.  Negative for immunocompromised state.  Neurological: Negative.  Negative for dizziness, seizures, numbness and headaches.  Hematological: Negative.   Psychiatric/Behavioral: Negative.  Negative for behavioral problems, self-injury and suicidal ideas. The patient is not nervous/anxious.    Vital Signs: BP 136/73    Pulse 92    Temp 98.3 F (36.8 C)    Resp 16    Ht 5\' 3"  (1.6 m)    Wt 155 lb (70.3 kg)    SpO2 97%    BMI 27.46 kg/m    Physical Exam Vitals reviewed.  Constitutional:      General: She is awake. She is not in acute distress.    Appearance: Normal appearance. She is well-developed, well-groomed and normal weight. She is not ill-appearing or diaphoretic.  HENT:     Head: Normocephalic and atraumatic.     Right Ear: Tympanic membrane, ear canal and external ear normal.     Left Ear: Tympanic membrane, ear canal and external ear normal.     Nose: Nose normal. No congestion or rhinorrhea.     Mouth/Throat:     Lips: Pink.     Mouth: Mucous membranes are moist.     Pharynx: Oropharynx is clear. Uvula midline. No oropharyngeal exudate.  Eyes:     General: Lids are normal. Vision grossly intact. Gaze aligned appropriately. No scleral icterus.       Right eye: No discharge.        Left eye: No discharge.     Conjunctiva/sclera:  Conjunctivae normal.     Pupils: Pupils are equal, round, and reactive to light.     Funduscopic exam:    Right eye: Red reflex present.        Left eye: Red reflex present. Neck:     Thyroid: No thyromegaly.     Vascular: No JVD.     Trachea: No tracheal deviation.  Cardiovascular:     Rate and Rhythm: Normal rate and regular rhythm.     Pulses: Normal pulses.     Heart sounds: Normal heart sounds, S1 normal and S2 normal. No murmur heard.   No friction rub. No gallop.  Pulmonary:  Effort: Pulmonary effort is normal. No accessory muscle usage or respiratory distress.     Breath sounds: Normal breath sounds and air entry. No stridor. No wheezing or rales.  Chest:     Chest wall: No tenderness.     Comments: Declined clincal breast exam Abdominal:     General: Bowel sounds are normal. There is no distension.     Palpations: Abdomen is soft. There is no mass.     Tenderness: There is no abdominal tenderness. There is no guarding or rebound.  Musculoskeletal:        General: No tenderness or deformity. Normal range of motion.     Cervical back: Normal range of motion and neck supple.     Right lower leg: No edema.     Left lower leg: No edema.  Lymphadenopathy:     Cervical: No cervical adenopathy.  Skin:    General: Skin is warm and dry.     Capillary Refill: Capillary refill takes less than 2 seconds.     Coloration: Skin is not pale.     Findings: No erythema or rash.  Neurological:     Mental Status: She is alert and oriented to person, place, and time.     Cranial Nerves: No cranial nerve deficit.     Motor: No abnormal muscle tone.     Coordination: Coordination normal.     Gait: Gait normal.     Deep Tendon Reflexes: Reflexes are normal and symmetric.  Psychiatric:        Mood and Affect: Mood normal.        Behavior: Behavior normal. Behavior is cooperative.        Thought Content: Thought content normal.        Judgment: Judgment normal.        Assessment/Plan: 1. Encounter for general adult medical examination with abnormal findings Age-appropriate preventive screenings and vaccinations discussed, annual physical exam completed. Routine labs for health maintenance ordered, see below. PHM updated.   2. Inflammatory polyarthropathy (HCC) Referred to pain clinic  - Ambulatory referral to Pain Clinic  3. Fibromyalgia Referred to pain clinic  - Ambulatory referral to Pain Clinic  4. Iron deficiency anemia, unspecified iron deficiency anemia type Routine lab ordered - B12 and Folate Panel  5. Mixed hyperlipidemia Routine lab ordered - Lipid Profile  6. B12 deficiency Routine lab ordered - B12 and Folate Panel  7. Vitamin D deficiency Routine lab ordered - Vitamin D (25 hydroxy)  8. Primary osteoarthritis of right knee New referral for pain clinic - Ambulatory referral to Pain Clinic  9. Dysuria Routine urinalysis done - UA/M w/rflx Culture, Routine      General Counseling: Letitia Neri understanding of the findings of todays visit and agrees with plan of treatment. I have discussed any further diagnostic evaluation that may be needed or ordered today. We also reviewed her medications today. she has been encouraged to call the office with any questions or concerns that should arise related to todays visit.    Orders Placed This Encounter  Procedures   UA/M w/rflx Culture, Routine   Lipid Profile   B12 and Folate Panel   Vitamin D (25 hydroxy)   Ambulatory referral to Pain Clinic    No orders of the defined types were placed in this encounter.    Return in about 3 months (around 11/28/2021) for F/U, med refill, Annalysse Shoemaker PCP.   Total time spent:30 Minutes Time spent includes review of chart, medications, test results,  and follow up plan with the patient.   [place] Controlled Substance Database was reviewed by me.  This patient was seen by Jonetta Osgood, FNP-C in collaboration with Dr.  Clayborn Bigness as a part of collaborative care agreement.  Danelle Curiale R. Valetta Fuller, MSN, FNP-C Internal medicine

## 2021-08-31 ENCOUNTER — Telehealth: Payer: Self-pay

## 2021-08-31 NOTE — Telephone Encounter (Signed)
Awaiting 08/30/21 office notes for pain referral-Toni

## 2021-09-03 LAB — UA/M W/RFLX CULTURE, ROUTINE

## 2021-09-16 ENCOUNTER — Encounter: Payer: Self-pay | Admitting: Nurse Practitioner

## 2021-09-19 DIAGNOSIS — H2513 Age-related nuclear cataract, bilateral: Secondary | ICD-10-CM | POA: Diagnosis not present

## 2021-09-19 NOTE — Telephone Encounter (Signed)
Pain referral manually faxed to [facility] mgmt [phone]

## 2021-10-02 NOTE — Telephone Encounter (Signed)
Lvm for specialist office to return my call to confirm they received referral-Toni

## 2021-10-30 ENCOUNTER — Telehealth: Payer: Self-pay

## 2021-10-30 MED ORDER — HYDROCOD POLI-CHLORPHE POLI ER 10-8 MG/5ML PO SUER: 5.0000 mL | Freq: Two times a day (BID) | ORAL | 0 refills | Status: DC | PRN

## 2021-10-30 NOTE — Telephone Encounter (Signed)
Lmom that we send medication if she keep having appt she need call office and make appt

## 2021-11-12 DIAGNOSIS — G894 Chronic pain syndrome: Secondary | ICD-10-CM | POA: Diagnosis not present

## 2021-11-12 DIAGNOSIS — M47812 Spondylosis without myelopathy or radiculopathy, cervical region: Secondary | ICD-10-CM | POA: Diagnosis not present

## 2021-11-12 DIAGNOSIS — M47816 Spondylosis without myelopathy or radiculopathy, lumbar region: Secondary | ICD-10-CM | POA: Diagnosis not present

## 2021-11-12 DIAGNOSIS — M7551 Bursitis of right shoulder: Secondary | ICD-10-CM | POA: Diagnosis not present

## 2021-11-13 DIAGNOSIS — D509 Iron deficiency anemia, unspecified: Secondary | ICD-10-CM | POA: Diagnosis not present

## 2021-11-13 DIAGNOSIS — E782 Mixed hyperlipidemia: Secondary | ICD-10-CM | POA: Diagnosis not present

## 2021-11-13 DIAGNOSIS — E559 Vitamin D deficiency, unspecified: Secondary | ICD-10-CM | POA: Diagnosis not present

## 2021-11-13 DIAGNOSIS — E538 Deficiency of other specified B group vitamins: Secondary | ICD-10-CM | POA: Diagnosis not present

## 2021-11-14 LAB — CBC WITH DIFFERENTIAL/PLATELET
Basophils Absolute: 0.1 10*3/uL (ref 0.0–0.2)
Basos: 1 %
EOS (ABSOLUTE): 0.5 10*3/uL — ABNORMAL HIGH (ref 0.0–0.4)
Eos: 6 %
Hematocrit: 36.6 % (ref 34.0–46.6)
Hemoglobin: 12.5 g/dL (ref 11.1–15.9)
Immature Grans (Abs): 0 10*3/uL (ref 0.0–0.1)
Immature Granulocytes: 0 %
Lymphocytes Absolute: 2.2 10*3/uL (ref 0.7–3.1)
Lymphs: 32 %
MCH: 29.8 pg (ref 26.6–33.0)
MCHC: 34.2 g/dL (ref 31.5–35.7)
MCV: 87 fL (ref 79–97)
Monocytes Absolute: 0.6 10*3/uL (ref 0.1–0.9)
Monocytes: 9 %
Neutrophils Absolute: 3.6 10*3/uL (ref 1.4–7.0)
Neutrophils: 52 %
Platelets: 198 10*3/uL (ref 150–450)
RBC: 4.19 x10E6/uL (ref 3.77–5.28)
RDW: 13 % (ref 11.7–15.4)
WBC: 7 10*3/uL (ref 3.4–10.8)

## 2021-11-14 LAB — VITAMIN D 25 HYDROXY (VIT D DEFICIENCY, FRACTURES): Vit D, 25-Hydroxy: 58.6 ng/mL (ref 30.0–100.0)

## 2021-11-14 LAB — B12 AND FOLATE PANEL
Folate: 5.2 ng/mL (ref >3.0)
Vitamin B-12: 404 pg/mL (ref 232–1245)

## 2021-11-14 LAB — BASIC METABOLIC PANEL
BUN/Creatinine Ratio: 15 (ref 12–28)
BUN: 22 mg/dL (ref 8–27)
CO2: 21 mmol/L (ref 20–29)
Calcium: 9.1 mg/dL (ref 8.7–10.3)
Chloride: 105 mmol/L (ref 96–106)
Creatinine, Ser: 1.43 mg/dL — ABNORMAL HIGH (ref 0.57–1.00)
Glucose: 79 mg/dL (ref 70–99)
Potassium: 4.2 mmol/L (ref 3.5–5.2)
Sodium: 141 mmol/L (ref 134–144)
eGFR: 38 mL/min/{1.73_m2} — ABNORMAL LOW (ref >59)

## 2021-11-14 LAB — LIPID PANEL
Chol/HDL Ratio: 2.6 ratio (ref 0.0–4.4)
Cholesterol, Total: 167 mg/dL (ref 100–199)
HDL: 64 mg/dL (ref >39)
LDL Chol Calc (NIH): 89 mg/dL (ref 0–99)
Triglycerides: 75 mg/dL (ref 0–149)
VLDL Cholesterol Cal: 14 mg/dL (ref 5–40)

## 2021-11-14 LAB — RHEUMATOID FACTOR: Rheumatoid fact SerPl-aCnc: <10 IU/mL (ref <14.0)

## 2021-11-14 LAB — C-REACTIVE PROTEIN: CRP: <1 mg/L (ref 0–10)

## 2021-11-14 LAB — ANA W/REFLEX IF POSITIVE: Anti Nuclear Antibody (ANA): NEGATIVE

## 2021-11-14 LAB — SEDIMENTATION RATE: Sed Rate: 6 mm/hr (ref 0–40)

## 2021-11-16 NOTE — Progress Notes (Signed)
I have reviewed the lab results. There are no critically abnormal values requiring immediate intervention but there are some abnormals that will be discussed at the next office visit.  

## 2021-11-29 ENCOUNTER — Other Ambulatory Visit: Payer: Self-pay

## 2021-11-29 ENCOUNTER — Ambulatory Visit (INDEPENDENT_AMBULATORY_CARE_PROVIDER_SITE_OTHER): Payer: Medicare Other | Admitting: Nurse Practitioner

## 2021-11-29 ENCOUNTER — Encounter: Payer: Self-pay | Admitting: Nurse Practitioner

## 2021-11-29 VITALS — BP 140/65 | HR 70 | Temp 98.0°F | Resp 16 | Ht 63.0 in | Wt 152.8 lb

## 2021-11-29 DIAGNOSIS — K219 Gastro-esophageal reflux disease without esophagitis: Secondary | ICD-10-CM

## 2021-11-29 DIAGNOSIS — E538 Deficiency of other specified B group vitamins: Secondary | ICD-10-CM

## 2021-11-29 DIAGNOSIS — E559 Vitamin D deficiency, unspecified: Secondary | ICD-10-CM

## 2021-11-29 DIAGNOSIS — R7989 Other specified abnormal findings of blood chemistry: Secondary | ICD-10-CM

## 2021-11-29 DIAGNOSIS — R052 Subacute cough: Secondary | ICD-10-CM

## 2021-11-29 MED ORDER — CYANOCOBALAMIN 1000 MCG/ML IJ SOLN
1000.0000 ug | Freq: Once | INTRAMUSCULAR | Status: AC
  Administered 2021-11-29: 1000 ug via INTRAMUSCULAR

## 2021-11-29 MED ORDER — ERGOCALCIFEROL 1.25 MG (50000 UT) PO CAPS: 50000.0000 [IU] | ORAL_CAPSULE | ORAL | 3 refills | Status: DC

## 2021-11-29 MED ORDER — BETAMETHASONE DIPROPIONATE 0.05 % EX CREA: TOPICAL_CREAM | Freq: Two times a day (BID) | CUTANEOUS | 2 refills | Status: DC

## 2021-11-29 MED ORDER — OMEPRAZOLE 40 MG PO CPDR: 40.0000 mg | DELAYED_RELEASE_CAPSULE | Freq: Every day | ORAL | 1 refills | Status: DC

## 2021-11-29 MED ORDER — HYDROCOD POLI-CHLORPHE POLI ER 10-8 MG/5ML PO SUER: 5.0000 mL | Freq: Two times a day (BID) | ORAL | 0 refills | Status: DC | PRN

## 2021-11-29 NOTE — Progress Notes (Signed)
[facility] ?[address] ?[place] [address] ? ?Internal MEDICINE  ?Office Visit Note ? ?Patient Name: Morgan Abbott ? [id number]  ?[id number] ? ?Date of Service: 11/29/2021 ? ?Chief Complaint  ?Patient presents with  ? Follow-up  ? Results  ?  labs  ? ? ?HPI ?Malia presents for a follow up visit to discuss lab results. She is in need of medication refills. Her cholesterol levels are normal. Her metabolic panel was normal except for an elevated creatinine level of 1.43 and eGFR of 38. She has not had a renal ultrasound done yet.  ?All her other labs are within normal limits. CBC is grossly normal.  ? ? ? ?Current Medication: ?Outpatient Encounter Medications as of 11/29/2021  ?Medication Sig  ? dextromethorphan-guaiFENesin (MUCINEX DM) 30-600 MG 12hr tablet Take 1 tablet by mouth 2 (two) times daily.  ? diazepam (VALIUM) 5 MG tablet Take 1 tablet (5 mg total) by mouth every 12 (twelve) hours as needed for anxiety. Or sleep  ? meloxicam (MOBIC) 7.5 MG tablet Take 7.5 mg by mouth daily.  ? oxyCODONE-acetaminophen (PERCOCET) 10-325 MG tablet Take by mouth. 1 TAB BY MOUTH EVERY 12 HRS X 10 DAYS, 1 TAB EVERY 8 HRS X 10 DAYS THEN 1 TAB EVERY 6 HRS X 10 DAYS  ? PAXLOVID, 300/100, 20 x 150 MG & 10 x 100MG TBPK Take by mouth.  ? sertraline (ZOLOFT) 100 MG tablet TAKE 1 TABLET BY MOUTH EVERY DAY  ? [DISCONTINUED] betamethasone dipropionate 0.05 % cream Apply topically 2 (two) times daily.  ? [DISCONTINUED] chlorpheniramine-HYDROcodone (TUSSIONEX PENNKINETIC ER) 10-8 MG/5ML Take 5 mLs by mouth every 12 (twelve) hours as needed for cough.  ? [DISCONTINUED] ergocalciferol (VITAMIN D2) 1.25 MG (50000 UT) capsule Take 1 capsule (50,000 Units total) by mouth every 14 (fourteen) days.  ? [DISCONTINUED] omeprazole (PRILOSEC) 40 MG capsule TAKE 1 CAPSULE (40 MG TOTAL) BY MOUTH DAILY.  ? albuterol (VENTOLIN HFA) 108 (90 Base) MCG/ACT inhaler Inhale 2 puffs into the lungs every 6 (six) hours as needed for up to 7 days  for wheezing or shortness of breath.  ? betamethasone dipropionate 0.05 % cream Apply topically 2 (two) times daily.  ? chlorpheniramine-HYDROcodone (TUSSIONEX PENNKINETIC ER) 10-8 MG/5ML Take 5 mLs by mouth every 12 (twelve) hours as needed for cough.  ? ergocalciferol (VITAMIN D2) 1.25 MG (50000 UT) capsule Take 1 capsule (50,000 Units total) by mouth every 14 (fourteen) days.  ? omeprazole (PRILOSEC) 40 MG capsule Take 1 capsule (40 mg total) by mouth daily.  ? [EXPIRED] cyanocobalamin ((VITAMIN B-12)) injection 1,000 mcg   ? ?No facility-administered encounter medications on file as of 11/29/2021.  ? ? ?Surgical History: ?Past Surgical History:  ?Procedure Laterality Date  ? CHOLECYSTECTOMY  1996  ? COLON RESECTION    ? dilatation and curettage    ? TONSILLECTOMY Bilateral   ? ? ?Medical History: ?Past Medical History:  ?Diagnosis Date  ? Allergic rhinitis   ? Arthritis   ? Bronchitis   ? Hyperlipidemia   ? Migraines   ? RSV (acute bronchiolitis due to respiratory syncytial virus)   ? ? ?Family History: ?Family History  ?Problem Relation Age of Onset  ? Hypertension Mother   ? Bladder Cancer Father   ?     2 yrs old when passed away  ? ? ?Social History  ? ?Socioeconomic History  ? Marital status: Married  ?  Spouse name: Not on file  ? Number of children: Not on file  ?  Years of education: Not on file  ? Highest education level: Not on file  ?Occupational History  ? Not on file  ?Tobacco Use  ? Smoking status: Never  ? Smokeless tobacco: Never  ?Substance and Sexual Activity  ? Alcohol use: No  ? Drug use: No  ? Sexual activity: Not on file  ?Other Topics Concern  ? Not on file  ?Social History Narrative  ? Not on file  ? ?Social Determinants of Health  ? ?Financial Resource Strain: Not on file  ?Food Insecurity: Not on file  ?Transportation Needs: Not on file  ?Physical Activity: Not on file  ?Stress: Not on file  ?Social Connections: Not on file  ?Intimate Partner Violence: Not on file  ? ? ? ? ?Review of  Systems  ?Constitutional:  Negative for chills, fatigue and unexpected weight change.  ?HENT:  Negative for congestion, rhinorrhea, sneezing and sore throat.   ?Eyes:  Negative for redness.  ?Respiratory:  Negative for cough, chest tightness and shortness of breath.   ?Cardiovascular:  Negative for chest pain and palpitations.  ?Gastrointestinal:  Negative for abdominal pain, constipation, diarrhea, nausea and vomiting.  ?Genitourinary:  Negative for dysuria and frequency.  ?Musculoskeletal:  Negative for arthralgias, back pain, joint swelling and neck pain.  ?Skin:  Negative for rash.  ?Neurological: Negative.  Negative for tremors and numbness.  ?Hematological:  Negative for adenopathy. Does not bruise/bleed easily.  ?Psychiatric/Behavioral:  Negative for behavioral problems (Depression), sleep disturbance and suicidal ideas. The patient is not nervous/anxious.   ? ?Vital Signs: ?BP 140/65   Pulse 70   Temp 98 ?F (36.7 ?C)   Resp 16   Ht 5' 3"  (1.6 m)   Wt 152 lb 12.8 oz (69.3 kg)   SpO2 96%   BMI 27.07 kg/m?  ? ? ?Physical Exam ?Vitals reviewed.  ?Constitutional:   ?   General: She is not in acute distress. ?   Appearance: Normal appearance. She is normal weight. She is not ill-appearing.  ?HENT:  ?   Head: Normocephalic and atraumatic.  ?Eyes:  ?   Pupils: Pupils are equal, round, and reactive to light.  ?Cardiovascular:  ?   Rate and Rhythm: Normal rate and regular rhythm.  ?Pulmonary:  ?   Effort: Pulmonary effort is normal. No respiratory distress.  ?Neurological:  ?   Mental Status: She is alert and oriented to person, place, and time.  ?Psychiatric:     ?   Mood and Affect: Mood normal.     ?   Behavior: Behavior normal.  ? ? ? ? ? ?Assessment/Plan: ?1. Elevated serum creatinine ?Renal ultrasound ordered.  ?- US RENAL; Future ? ?2. Vitamin D deficiency ?Level is stable, patient wants to continue taking the weekly dose since her level was significantly low prior to starting the supplement.  ?-  ergocalciferol (VITAMIN D2) 1.25 MG (50000 UT) capsule; Take 1 capsule (50,000 Units total) by mouth every 14 (fourteen) days.  Dispense: 4 capsule; Refill: 3 ? ?3. B12 deficiency ?B12 injection done today in office ?- cyanocobalamin ((VITAMIN B-12)) injection 1,000 mcg ? ?4. Gastroesophageal reflux disease without esophagitis ?Stable with omeprazole, refills ordered. ?- omeprazole (PRILOSEC) 40 MG capsule; Take 1 capsule (40 mg total) by mouth daily.  Dispense: 90 capsule; Refill: 1 ? ?5. Subacute cough ?Tussionex reordered per patient request.  ? ? ?General Counseling: layal javid understanding of the findings of todays visit and agrees with plan of treatment. I have discussed any further diagnostic evaluation that  may be needed or ordered today. We also reviewed her medications today. she has been encouraged to call the office with any questions or concerns that should arise related to todays visit. ? ? ? ?Orders Placed This Encounter  ?Procedures  ? US RENAL  ? ? ?Meds ordered this encounter  ?Medications  ? ergocalciferol (VITAMIN D2) 1.25 MG (50000 UT) capsule  ?  Sig: Take 1 capsule (50,000 Units total) by mouth every 14 (fourteen) days.  ?  Dispense:  4 capsule  ?  Refill:  3  ? cyanocobalamin ((VITAMIN B-12)) injection 1,000 mcg  ? betamethasone dipropionate 0.05 % cream  ?  Sig: Apply topically 2 (two) times daily.  ?  Dispense:  30 g  ?  Refill:  2  ? chlorpheniramine-HYDROcodone (TUSSIONEX PENNKINETIC ER) 10-8 MG/5ML  ?  Sig: Take 5 mLs by mouth every 12 (twelve) hours as needed for cough.  ?  Dispense:  200 mL  ?  Refill:  0  ? omeprazole (PRILOSEC) 40 MG capsule  ?  Sig: Take 1 capsule (40 mg total) by mouth daily.  ?  Dispense:  90 capsule  ?  Refill:  1  ? ? ?Return in about 1 month (around 12/30/2021) for F/U, U/S @ [facility] Ngo PCP. ? ? ?Total time spent:30 Minutes ?Time spent includes review of chart, medications, test results, and follow up plan with the patient.  ? ?[place] Controlled Substance  Database was reviewed by me. ? ?This patient was seen by Jonetta Osgood, FNP-C in collaboration with Dr. Clayborn Bigness as a part of collaborative care agreement. ? ? ?Pepe Mineau R. Valetta Fuller, MSN, FNP-C ?Internal medicine

## 2021-12-10 DIAGNOSIS — M7551 Bursitis of right shoulder: Secondary | ICD-10-CM | POA: Diagnosis not present

## 2021-12-10 DIAGNOSIS — G894 Chronic pain syndrome: Secondary | ICD-10-CM | POA: Diagnosis not present

## 2021-12-10 DIAGNOSIS — M47816 Spondylosis without myelopathy or radiculopathy, lumbar region: Secondary | ICD-10-CM | POA: Diagnosis not present

## 2021-12-10 DIAGNOSIS — M47812 Spondylosis without myelopathy or radiculopathy, cervical region: Secondary | ICD-10-CM | POA: Diagnosis not present

## 2021-12-14 ENCOUNTER — Encounter: Payer: Self-pay | Admitting: Nurse Practitioner

## 2021-12-27 ENCOUNTER — Telehealth: Payer: Self-pay

## 2021-12-27 NOTE — Telephone Encounter (Signed)
Pt called requesting a refill on tussinex.  She is still having a dry cough and feels something is in throat and won't come up.  Still says left over from covid.  [facility] [address] [address].  Call back [phone] ?

## 2021-12-28 ENCOUNTER — Other Ambulatory Visit: Payer: Self-pay | Admitting: Physician Assistant

## 2021-12-28 ENCOUNTER — Telehealth: Payer: Self-pay

## 2021-12-28 DIAGNOSIS — R052 Subacute cough: Secondary | ICD-10-CM

## 2021-12-28 MED ORDER — HYDROCOD POLI-CHLORPHE POLI ER 10-8 MG/5ML PO SUER: 5.0000 mL | Freq: Two times a day (BID) | ORAL | 0 refills | Status: DC | PRN

## 2021-12-28 NOTE — Telephone Encounter (Signed)
Pt walked into the office at 2:35 pm for a cough medicine refill (tussinex).  We had Lauren send in the prescription.  We also advised pt that she can't get the cough medication all the time without being seen and she needed to be seen by the specialist pulmonologist DSK.  We did schedule pt an appointment and advised her that if cough not going away the she needs to be seen earlier  ?

## 2022-01-02 ENCOUNTER — Other Ambulatory Visit: Payer: Medicare Other

## 2022-01-07 ENCOUNTER — Other Ambulatory Visit: Payer: Medicare Other

## 2022-01-07 DIAGNOSIS — M47816 Spondylosis without myelopathy or radiculopathy, lumbar region: Secondary | ICD-10-CM | POA: Diagnosis not present

## 2022-01-07 DIAGNOSIS — M47812 Spondylosis without myelopathy or radiculopathy, cervical region: Secondary | ICD-10-CM | POA: Diagnosis not present

## 2022-01-07 DIAGNOSIS — G894 Chronic pain syndrome: Secondary | ICD-10-CM | POA: Diagnosis not present

## 2022-01-07 DIAGNOSIS — M7551 Bursitis of right shoulder: Secondary | ICD-10-CM | POA: Diagnosis not present

## 2022-01-08 ENCOUNTER — Telehealth: Payer: Self-pay

## 2022-01-08 NOTE — Telephone Encounter (Signed)
Left vm to confirm 01/14/22 appointment-Toni ?

## 2022-01-09 ENCOUNTER — Ambulatory Visit: Payer: Medicare Other | Admitting: Nurse Practitioner

## 2022-01-14 ENCOUNTER — Ambulatory Visit (INDEPENDENT_AMBULATORY_CARE_PROVIDER_SITE_OTHER): Payer: Medicare Other

## 2022-01-14 DIAGNOSIS — R7989 Other specified abnormal findings of blood chemistry: Secondary | ICD-10-CM

## 2022-01-16 ENCOUNTER — Ambulatory Visit: Payer: Medicare Other | Admitting: Nurse Practitioner

## 2022-01-21 ENCOUNTER — Other Ambulatory Visit: Payer: Medicare Other

## 2022-01-28 ENCOUNTER — Encounter: Payer: Self-pay | Admitting: Nurse Practitioner

## 2022-01-28 ENCOUNTER — Ambulatory Visit (INDEPENDENT_AMBULATORY_CARE_PROVIDER_SITE_OTHER): Payer: Medicare Other | Admitting: Nurse Practitioner

## 2022-01-28 VITALS — BP 140/77 | HR 70 | Temp 98.9°F | Resp 16 | Ht 63.0 in | Wt 147.6 lb

## 2022-01-28 DIAGNOSIS — E538 Deficiency of other specified B group vitamins: Secondary | ICD-10-CM | POA: Diagnosis not present

## 2022-01-28 DIAGNOSIS — R052 Subacute cough: Secondary | ICD-10-CM

## 2022-01-28 DIAGNOSIS — L309 Dermatitis, unspecified: Secondary | ICD-10-CM | POA: Diagnosis not present

## 2022-01-28 MED ORDER — BETAMETHASONE DIPROPIONATE 0.05 % EX CREA: TOPICAL_CREAM | Freq: Two times a day (BID) | CUTANEOUS | 2 refills | Status: DC

## 2022-01-28 MED ORDER — HYDROCOD POLI-CHLORPHE POLI ER 10-8 MG/5ML PO SUER: 5.0000 mL | Freq: Two times a day (BID) | ORAL | 0 refills | Status: DC | PRN

## 2022-01-28 MED ORDER — CYANOCOBALAMIN 1000 MCG/ML IJ SOLN: 1000.0000 ug | Freq: Once | INTRAMUSCULAR | Status: DC

## 2022-01-28 NOTE — Progress Notes (Signed)
[facility] [address] [place], [place] [address]  Internal MEDICINE  Office Visit Note  Patient Name: Morgan Abbott  [id number]  [id number]  Date of Service: 01/28/2022  Chief Complaint  Patient presents with   Follow-up   Hyperlipidemia   Results    HPI Karagan presents for follow-up visit to review the results of her renal ultrasound.  The renal ultrasound was ordered due to an elevated creatinine of 1.43 and a decreased GFR of 38.  Her renal ultrasound was normal.  Her kidneys and bladder were structurally normal with no kidney stones, lesions or other abnormalities noted. Patient reports she is concerned about weight loss since October of approximately 23 pounds, she reports she eats about 1 meal per day, she has had decreased appetite since she was in the hospital in the fall last year. We will check additional labs in August as well as repeat renal function labs She continues to have a cough especially at night since having RSV in the fall and COVID in December last year, she is scheduled to see Dr. Elliot Dally on June 13.  Tussionex continues to be the only cough suppressant that works well enough that she can sleep at night per patient report   Current Medication: Outpatient Encounter Medications as of 01/28/2022  Medication Sig   dextromethorphan-guaiFENesin (MUCINEX DM) 30-600 MG 12hr tablet Take 1 tablet by mouth 2 (two) times daily.   diazepam (VALIUM) 5 MG tablet Take 1 tablet (5 mg total) by mouth every 12 (twelve) hours as needed for anxiety. Or sleep   ergocalciferol (VITAMIN D2) 1.25 MG (50000 UT) capsule Take 1 capsule (50,000 Units total) by mouth every 14 (fourteen) days.   meloxicam (MOBIC) 7.5 MG tablet Take 7.5 mg by mouth daily.   omeprazole (PRILOSEC) 40 MG capsule Take 1 capsule (40 mg total) by mouth daily.   oxyCODONE-acetaminophen (PERCOCET) 10-325 MG tablet Take by mouth. 1 TAB BY MOUTH EVERY 12 HRS X 10 DAYS, 1 TAB EVERY 8 HRS X 10 DAYS THEN 1  TAB EVERY 6 HRS X 10 DAYS   PAXLOVID, 300/100, 20 x 150 MG & 10 x 100MG  TBPK Take by mouth.   [DISCONTINUED] betamethasone dipropionate 0.05 % cream Apply topically 2 (two) times daily.   [DISCONTINUED] chlorpheniramine-HYDROcodone (TUSSIONEX PENNKINETIC ER) 10-8 MG/5ML Take 5 mLs by mouth every 12 (twelve) hours as needed for cough.   [DISCONTINUED] sertraline (ZOLOFT) 100 MG tablet TAKE 1 TABLET BY MOUTH EVERY DAY   albuterol (VENTOLIN HFA) 108 (90 Base) MCG/ACT inhaler Inhale 2 puffs into the lungs every 6 (six) hours as needed for up to 7 days for wheezing or shortness of breath.   betamethasone dipropionate 0.05 % cream Apply topically 2 (two) times daily.   chlorpheniramine-HYDROcodone (TUSSIONEX PENNKINETIC ER) 10-8 MG/5ML Take 5 mLs by mouth every 12 (twelve) hours as needed for cough.   Facility-Administered Encounter Medications as of 01/28/2022  Medication   cyanocobalamin ((VITAMIN B-12)) injection 1,000 mcg    Surgical History: Past Surgical History:  Procedure Laterality Date   CHOLECYSTECTOMY  1996   COLON RESECTION     dilatation and curettage     TONSILLECTOMY Bilateral     Medical History: Past Medical History:  Diagnosis Date   Allergic rhinitis    Arthritis    Bronchitis    Hyperlipidemia    Migraines    RSV (acute bronchiolitis due to respiratory syncytial virus)     Family History: Family History  Problem Relation Age  of Onset   Hypertension Mother    Bladder Cancer Father        2 yrs old when passed away    Social History   Socioeconomic History   Marital status: Married    Spouse name: Not on file   Number of children: Not on file   Years of education: Not on file   Highest education level: Not on file  Occupational History   Not on file  Tobacco Use   Smoking status: Never   Smokeless tobacco: Never  Substance and Sexual Activity   Alcohol use: No   Drug use: No   Sexual activity: Not on file  Other Topics Concern   Not on file   Social History Narrative   Not on file   Social Determinants of Health   Financial Resource Strain: Not on file  Food Insecurity: Not on file  Transportation Needs: Not on file  Physical Activity: Not on file  Stress: Not on file  Social Connections: Not on file  Intimate Partner Violence: Not on file      Review of Systems  Constitutional:  Positive for fatigue. Negative for chills and unexpected weight change.  HENT:  Negative for congestion, rhinorrhea, sneezing and sore throat.   Eyes:  Negative for redness.  Respiratory:  Positive for cough. Negative for chest tightness, shortness of breath and wheezing.   Cardiovascular:  Negative for chest pain and palpitations.  Gastrointestinal:  Negative for abdominal pain, constipation, diarrhea, nausea and vomiting.  Genitourinary:  Negative for dysuria and frequency.  Musculoskeletal:  Negative for arthralgias, back pain, joint swelling and neck pain.  Skin:  Negative for rash.  Neurological: Negative.  Negative for tremors and numbness.  Psychiatric/Behavioral:  Negative for behavioral problems (Depression), sleep disturbance and suicidal ideas. The patient is not nervous/anxious.     Vital Signs: BP 140/77   Pulse 70   Temp 98.9 F (37.2 C)   Resp 16   Ht 5\' 3"  (1.6 m)   Wt 147 lb 9.6 oz (67 kg)   SpO2 97%   BMI 26.15 kg/m    Physical Exam Vitals reviewed.  Constitutional:      General: She is not in acute distress.    Appearance: Normal appearance. She is obese. She is not ill-appearing.  HENT:     Head: Normocephalic and atraumatic.  Eyes:     Pupils: Pupils are equal, round, and reactive to light.  Cardiovascular:     Rate and Rhythm: Normal rate and regular rhythm.     Heart sounds: Normal heart sounds. No murmur heard.    No friction rub. No gallop.  Pulmonary:     Effort: Pulmonary effort is normal. No respiratory distress.     Breath sounds: Normal breath sounds. No wheezing.  Neurological:      Mental Status: She is alert and oriented to person, place, and time.  Psychiatric:        Mood and Affect: Mood normal.        Behavior: Behavior normal.        Assessment/Plan: 1. Subacute cough Patient continues to have a cough that has been present for several weeks.  She continues to use the Tussionex cough syrup because nothing else controls the cough well enough to let her sleep at night, refill ordered.  She also will have an upcoming office visit with Dr. .  2. B12 deficiency B12 injection administered in office today. - cyanocobalamin ((VITAMIN B-12)) injection 1,000  mcg  3. Dermatitis due to unknown cause She has had some mild skin reactions possible allergic contact dermatitis over the years and keeps betamethasone cream on hand in case she has a rash.  Refill ordered. - betamethasone dipropionate 0.05 % cream; Apply topically 2 (two) times daily.  Dispense: 30 g; Refill: 2   General Counseling: Penne verbalizes understanding of the findings of todays visit and agrees with plan of treatment. I have discussed any further diagnostic evaluation that may be needed or ordered today. We also reviewed her medications today. she has been encouraged to call the office with any questions or concerns that should arise related to todays visit.    No orders of the defined types were placed in this encounter.   Meds ordered this encounter  Medications   chlorpheniramine-HYDROcodone (TUSSIONEX PENNKINETIC ER) 10-8 MG/5ML    Sig: Take 5 mLs by mouth every 12 (twelve) hours as needed for cough.    Dispense:  200 mL    Refill:  0   cyanocobalamin ((VITAMIN B-12)) injection 1,000 mcg   betamethasone dipropionate 0.05 % cream    Sig: Apply topically 2 (two) times daily.    Dispense:  30 g    Refill:  2    Return in about 3 months (around 04/30/2022) for F/U, Roselani Grajeda PCP.   Total time spent:30 Minutes Time spent includes review of chart, medications, test results, and  follow up plan with the patient.   [place] Controlled Substance Database was reviewed by me.  This patient was seen by Sallyanne KusterAlyssa Darly Massi, FNP-C in collaboration with Dr. Beverely RisenFozia Khan as a part of collaborative care agreement.   Zuzu Befort R. Tedd SiasAbernathy, MSN, FNP-C Internal medicine

## 2022-02-04 DIAGNOSIS — G894 Chronic pain syndrome: Secondary | ICD-10-CM | POA: Diagnosis not present

## 2022-02-04 DIAGNOSIS — M47816 Spondylosis without myelopathy or radiculopathy, lumbar region: Secondary | ICD-10-CM | POA: Diagnosis not present

## 2022-02-04 DIAGNOSIS — M47812 Spondylosis without myelopathy or radiculopathy, cervical region: Secondary | ICD-10-CM | POA: Diagnosis not present

## 2022-02-04 DIAGNOSIS — M7551 Bursitis of right shoulder: Secondary | ICD-10-CM | POA: Diagnosis not present

## 2022-02-21 ENCOUNTER — Telehealth: Payer: Self-pay

## 2022-02-21 NOTE — Telephone Encounter (Signed)
Lvm to see is patient would move to Alyssa's 10:00 slot on 02/26/22-Toni

## 2022-02-26 ENCOUNTER — Ambulatory Visit
Admission: RE | Admit: 2022-02-26 | Discharge: 2022-02-26 | Disposition: A | Discharge status: Home / Self Care | Payer: Medicare Other | Attending: Nurse Practitioner | Admitting: Nurse Practitioner

## 2022-02-26 ENCOUNTER — Ambulatory Visit (INDEPENDENT_AMBULATORY_CARE_PROVIDER_SITE_OTHER): Payer: Medicare Other | Admitting: Nurse Practitioner

## 2022-02-26 ENCOUNTER — Ambulatory Visit
Admission: RE | Admit: 2022-02-26 | Discharge: 2022-02-26 | Disposition: A | Discharge status: Home / Self Care | Payer: Medicare Other | Source: Ambulatory Visit | Attending: Nurse Practitioner | Admitting: Nurse Practitioner

## 2022-02-26 ENCOUNTER — Encounter: Payer: Self-pay | Admitting: Nurse Practitioner

## 2022-02-26 VITALS — BP 138/73 | HR 85 | Temp 98.6°F | Resp 16 | Ht 63.0 in | Wt 143.8 lb

## 2022-02-26 DIAGNOSIS — R053 Chronic cough: Secondary | ICD-10-CM | POA: Insufficient documentation

## 2022-02-26 DIAGNOSIS — E538 Deficiency of other specified B group vitamins: Secondary | ICD-10-CM | POA: Diagnosis not present

## 2022-02-26 DIAGNOSIS — Z8619 Personal history of other infectious and parasitic diseases: Secondary | ICD-10-CM | POA: Diagnosis not present

## 2022-02-26 DIAGNOSIS — J9811 Atelectasis: Secondary | ICD-10-CM | POA: Diagnosis not present

## 2022-02-26 DIAGNOSIS — R052 Subacute cough: Secondary | ICD-10-CM

## 2022-02-26 DIAGNOSIS — B974 Respiratory syncytial virus as the cause of diseases classified elsewhere: Secondary | ICD-10-CM | POA: Diagnosis not present

## 2022-02-26 MED ORDER — HYDROCOD POLI-CHLORPHE POLI ER 10-8 MG/5ML PO SUER: 5.0000 mL | Freq: Two times a day (BID) | ORAL | 0 refills | Status: DC | PRN

## 2022-02-26 MED ORDER — PREDNISONE 10 MG PO TABS: ORAL_TABLET | ORAL | 0 refills | Status: DC

## 2022-02-26 MED ORDER — CYANOCOBALAMIN 1000 MCG/ML IJ SOLN: 1000.0000 ug | Freq: Once | INTRAMUSCULAR | Status: DC

## 2022-02-26 NOTE — Progress Notes (Signed)
[facility] [address] [place], [place] [address]  Internal MEDICINE  Office Visit Note  Patient Name: Morgan Abbott  [id number]  [id number]  Date of Service: 02/26/2022  Chief Complaint  Patient presents with   Acute Visit   Cough    Since since October 2022     HPI Morgan Abbott presents for an acute sick visit for persistent cough, chronic cough since October 2022 when she was hospitalized for RSV. Cough is now chronic and has not improved.  Denies SOB, wheezing, chest tightness Has hoarseness, post nasal drip present.  Lost 4 lbs, does not eat a lot, never has.    Current Medication:  Outpatient Encounter Medications as of 02/26/2022  Medication Sig   betamethasone dipropionate 0.05 % cream Apply topically 2 (two) times daily.   dextromethorphan-guaiFENesin (MUCINEX DM) 30-600 MG 12hr tablet Take 1 tablet by mouth 2 (two) times daily.   diazepam (VALIUM) 5 MG tablet Take 1 tablet (5 mg total) by mouth every 12 (twelve) hours as needed for anxiety. Or sleep   ergocalciferol (VITAMIN D2) 1.25 MG (50000 UT) capsule Take 1 capsule (50,000 Units total) by mouth every 14 (fourteen) days.   meloxicam (MOBIC) 7.5 MG tablet Take 7.5 mg by mouth daily.   omeprazole (PRILOSEC) 40 MG capsule Take 1 capsule (40 mg total) by mouth daily.   oxyCODONE-acetaminophen (PERCOCET) 10-325 MG tablet Take by mouth. 1 TAB BY MOUTH EVERY 12 HRS X 10 DAYS, 1 TAB EVERY 8 HRS X 10 DAYS THEN 1 TAB EVERY 6 HRS X 10 DAYS   predniSONE (DELTASONE) 10 MG tablet Take one tab 3 x day for 3 days, then take one tab 2 x a day for 3 days and then take one tab a day for 3 days for copd   [DISCONTINUED] chlorpheniramine-HYDROcodone (TUSSIONEX PENNKINETIC ER) 10-8 MG/5ML Take 5 mLs by mouth every 12 (twelve) hours as needed for cough.   [DISCONTINUED] PAXLOVID, 300/100, 20 x 150 MG & 10 x 100MG  TBPK Take by mouth.   albuterol (VENTOLIN HFA) 108 (90 Base) MCG/ACT inhaler Inhale 2 puffs into the lungs every 6  (six) hours as needed for up to 7 days for wheezing or shortness of breath.   [DISCONTINUED] chlorpheniramine-HYDROcodone (TUSSIONEX PENNKINETIC ER) 10-8 MG/5ML Take 5 mLs by mouth every 12 (twelve) hours as needed for cough.   Facility-Administered Encounter Medications as of 02/26/2022  Medication   cyanocobalamin ((VITAMIN B-12)) injection 1,000 mcg   cyanocobalamin ((VITAMIN B-12)) injection 1,000 mcg      Medical History: Past Medical History:  Diagnosis Date   Allergic rhinitis    Arthritis    Bronchitis    Hyperlipidemia    Migraines    RSV (acute bronchiolitis due to respiratory syncytial virus)      Vital Signs: BP 138/73   Pulse 85   Temp 98.6 F (37 C)   Resp 16   Ht 5\' 3"  (1.6 m)   Wt 143 lb 12.8 oz (65.2 kg)   SpO2 98%   BMI 25.47 kg/m    Review of Systems  Constitutional:  Positive for fatigue. Negative for chills and unexpected weight change.  HENT:  Negative for congestion, rhinorrhea, sneezing and sore throat.   Eyes:  Negative for redness.  Respiratory:  Positive for cough. Negative for chest tightness, shortness of breath and wheezing.   Cardiovascular: Negative.  Negative for chest pain and palpitations.  Gastrointestinal:  Negative for abdominal pain, constipation, diarrhea, nausea and vomiting.  Genitourinary:  Negative for dysuria and frequency.  Musculoskeletal:  Negative for arthralgias, back pain, joint swelling and neck pain.  Skin:  Negative for rash.  Neurological: Negative.  Negative for tremors and numbness.  Psychiatric/Behavioral:  Negative for behavioral problems (Depression), sleep disturbance and suicidal ideas. The patient is not nervous/anxious.     Physical Exam Vitals reviewed.  Constitutional:      General: She is not in acute distress.    Appearance: Normal appearance. She is normal weight. She is not ill-appearing.  HENT:     Head: Normocephalic and atraumatic.     Right Ear: Tympanic membrane, ear canal and external  ear normal.     Left Ear: Tympanic membrane, ear canal and external ear normal.     Nose: Nose normal. No congestion or rhinorrhea.     Mouth/Throat:     Mouth: Mucous membranes are moist.     Pharynx: Oropharynx is clear. No oropharyngeal exudate or posterior oropharyngeal erythema.  Eyes:     Pupils: Pupils are equal, round, and reactive to light.  Cardiovascular:     Rate and Rhythm: Normal rate and regular rhythm.     Heart sounds: Normal heart sounds. No murmur heard. Pulmonary:     Effort: Pulmonary effort is normal. No respiratory distress.     Breath sounds: Normal breath sounds. No wheezing.  Neurological:     Mental Status: She is alert and oriented to person, place, and time.  Psychiatric:        Mood and Affect: Mood normal.        Behavior: Behavior normal.       Assessment/Plan: 1. Chronic cough Hest xray ordered to rule out pneumonia or other lung abnormalities that can cause a chronic cough. Prednisone taper prescribed per patient request. Tussionex cough syrup refill ordered as well.  - DG Chest 2 View; Future - predniSONE (DELTASONE) 10 MG tablet; Take one tab 3 x day for 3 days, then take one tab 2 x a day for 3 days and then take one tab a day for 3 days for copd  Dispense: 18 tablet; Refill: 0  2. B12 deficiency B12 injection administered in office today.  - cyanocobalamin ((VITAMIN B-12)) injection 1,000 mcg  3. History of respiratory syncytial virus (RSV) infection Chest xray for further evaluation.  - DG Chest 2 View; Future   General Counseling: amiya escamilla understanding of the findings of todays visit and agrees with plan of treatment. I have discussed any further diagnostic evaluation that may be needed or ordered today. We also reviewed her medications today. she has been encouraged to call the office with any questions or concerns that should arise related to todays visit.    Counseling:    Orders Placed This Encounter  Procedures    DG Chest 2 View    Meds ordered this encounter  Medications   cyanocobalamin ((VITAMIN B-12)) injection 1,000 mcg   predniSONE (DELTASONE) 10 MG tablet    Sig: Take one tab 3 x day for 3 days, then take one tab 2 x a day for 3 days and then take one tab a day for 3 days for copd    Dispense:  18 tablet    Refill:  0   DISCONTD: chlorpheniramine-HYDROcodone (TUSSIONEX PENNKINETIC ER) 10-8 MG/5ML    Sig: Take 5 mLs by mouth every 12 (twelve) hours as needed for cough.    Dispense:  200 mL    Refill:  0    Return if symptoms  worsen or fail to improve.  [place] Controlled Substance Database was reviewed by me for overdose risk score (ORS)  Time spent:30 Minutes Time spent with patient included reviewing progress notes, labs, imaging studies, and discussing plan for follow up.   This patient was seen by Sallyanne KusterAlyssa Delyle Weider, FNP-C in collaboration with Dr. Beverely RisenFozia Khan as a part of collaborative care agreement.  Carlyann Placide R. Tedd SiasAbernathy, MSN, FNP-C Internal Medicine

## 2022-03-01 ENCOUNTER — Telehealth: Payer: Self-pay

## 2022-03-01 NOTE — Telephone Encounter (Signed)
-----   Message from Lyndon Code, MD sent at 02/27/2022  6:08 PM EDT ----- Normal cxr

## 2022-03-01 NOTE — Telephone Encounter (Signed)
Pt informed of normal chest xray

## 2022-03-04 DIAGNOSIS — M47812 Spondylosis without myelopathy or radiculopathy, cervical region: Secondary | ICD-10-CM | POA: Diagnosis not present

## 2022-03-04 DIAGNOSIS — M7551 Bursitis of right shoulder: Secondary | ICD-10-CM | POA: Diagnosis not present

## 2022-03-04 DIAGNOSIS — M47816 Spondylosis without myelopathy or radiculopathy, lumbar region: Secondary | ICD-10-CM | POA: Diagnosis not present

## 2022-03-04 DIAGNOSIS — G894 Chronic pain syndrome: Secondary | ICD-10-CM | POA: Diagnosis not present

## 2022-03-21 ENCOUNTER — Telehealth: Payer: Self-pay

## 2022-03-21 ENCOUNTER — Other Ambulatory Visit: Payer: Self-pay | Admitting: Nurse Practitioner

## 2022-03-21 DIAGNOSIS — R052 Subacute cough: Secondary | ICD-10-CM

## 2022-03-21 MED ORDER — HYDROCOD POLI-CHLORPHE POLI ER 10-8 MG/5ML PO SUER: 5.0000 mL | Freq: Two times a day (BID) | ORAL | 0 refills | Status: DC | PRN

## 2022-03-21 NOTE — Telephone Encounter (Signed)
LMOM to inform pt TUSSIONEX was sent to pharmacy by [facility]

## 2022-03-24 ENCOUNTER — Encounter: Payer: Self-pay | Admitting: Nurse Practitioner

## 2022-04-01 DIAGNOSIS — M47812 Spondylosis without myelopathy or radiculopathy, cervical region: Secondary | ICD-10-CM | POA: Diagnosis not present

## 2022-04-01 DIAGNOSIS — M7551 Bursitis of right shoulder: Secondary | ICD-10-CM | POA: Diagnosis not present

## 2022-04-01 DIAGNOSIS — G894 Chronic pain syndrome: Secondary | ICD-10-CM | POA: Diagnosis not present

## 2022-04-01 DIAGNOSIS — M47816 Spondylosis without myelopathy or radiculopathy, lumbar region: Secondary | ICD-10-CM | POA: Diagnosis not present

## 2022-04-10 DIAGNOSIS — H524 Presbyopia: Secondary | ICD-10-CM | POA: Diagnosis not present

## 2022-04-19 ENCOUNTER — Other Ambulatory Visit: Payer: Self-pay | Admitting: Internal Medicine

## 2022-04-19 ENCOUNTER — Telehealth: Payer: Self-pay

## 2022-04-19 NOTE — Telephone Encounter (Signed)
Tried to call pt multiple times, LMOM for pt to try OTC mucinex. Pt needs to know we are not sending tussionex and if she has any questions she can see [facility] for an office visit

## 2022-04-19 NOTE — Telephone Encounter (Addendum)
Pt called requesting refill of possibly tussionex to be sent to [facility] [address]. LMOM to confirm med and to discuss symptoms. Pt called back and did want the tussionex, she said she had a quite dry cough since she had covid and RSV. Spoke to [facility] she said do not give any narcotics please, and pt can take mucinex otc. LMOM to suggest she try mucinex otc.

## 2022-04-22 ENCOUNTER — Encounter: Payer: Self-pay | Admitting: Nurse Practitioner

## 2022-04-22 ENCOUNTER — Telehealth (INDEPENDENT_AMBULATORY_CARE_PROVIDER_SITE_OTHER): Payer: Medicare Other | Admitting: Nurse Practitioner

## 2022-04-22 VITALS — Temp 99.0°F | Resp 16 | Ht 63.0 in

## 2022-04-22 DIAGNOSIS — R053 Chronic cough: Secondary | ICD-10-CM

## 2022-04-22 DIAGNOSIS — E559 Vitamin D deficiency, unspecified: Secondary | ICD-10-CM

## 2022-04-22 DIAGNOSIS — F411 Generalized anxiety disorder: Secondary | ICD-10-CM

## 2022-04-22 DIAGNOSIS — Z760 Encounter for issue of repeat prescription: Secondary | ICD-10-CM | POA: Diagnosis not present

## 2022-04-22 DIAGNOSIS — K219 Gastro-esophageal reflux disease without esophagitis: Secondary | ICD-10-CM

## 2022-04-22 DIAGNOSIS — J0110 Acute frontal sinusitis, unspecified: Secondary | ICD-10-CM

## 2022-04-22 DIAGNOSIS — J986 Disorders of diaphragm: Secondary | ICD-10-CM | POA: Diagnosis not present

## 2022-04-22 DIAGNOSIS — F1190 Opioid use, unspecified, uncomplicated: Secondary | ICD-10-CM

## 2022-04-22 DIAGNOSIS — G479 Sleep disorder, unspecified: Secondary | ICD-10-CM

## 2022-04-22 MED ORDER — AZITHROMYCIN 250 MG PO TABS: ORAL_TABLET | ORAL | 0 refills | Status: DC

## 2022-04-22 MED ORDER — ERGOCALCIFEROL 1.25 MG (50000 UT) PO CAPS: 50000.0000 [IU] | ORAL_CAPSULE | ORAL | 3 refills | Status: DC

## 2022-04-22 MED ORDER — OMEPRAZOLE 40 MG PO CPDR: 40.0000 mg | DELAYED_RELEASE_CAPSULE | Freq: Every day | ORAL | 1 refills | Status: DC

## 2022-04-22 MED ORDER — DIAZEPAM 5 MG PO TABS: 5.0000 mg | ORAL_TABLET | Freq: Two times a day (BID) | ORAL | 0 refills | Status: DC | PRN

## 2022-04-22 MED ORDER — BENZONATATE 200 MG PO CAPS: 200.0000 mg | ORAL_CAPSULE | Freq: Two times a day (BID) | ORAL | 0 refills | Status: DC | PRN

## 2022-04-22 NOTE — Progress Notes (Cosign Needed Addendum)
[facility] [address] [place], [place] [address]  Internal MEDICINE  Telephone Visit  Patient Name: Morgan Abbott  [id number]  [id number]  Date of Service: 04/22/2022  I connected with the patient at 12:45 PM by telephone and verified the patients identity using two identifiers.   I discussed the limitations, risks, security and privacy concerns of performing an evaluation and management service by telephone and the availability of in person appointments. I also discussed with the patient that there may be a patient responsible charge related to the service.  The patient expressed understanding and agrees to proceed.    Chief Complaint  Patient presents with   Telephone Assessment    [phone], prefers phone   Telephone Screen   Sinusitis    Cough, sore throat, body aches, chills, headache - has not tested for Covid     HPI Morgan Abbott presents for a telehealth virtual visit for symptoms of sinusitis.   - having cough, sore throat, body aches, chills, headache, and nasal congestion.  -has chronic cough since being hospitalized for RSV and then having a COVID infection in December last year. 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 Patient has already been informed that we can no longer prescribed tussionex to her.  Currently receiving 150 tablets of percocet 10-325 mg every month from her current pain medicine specialist.  -calls several times a day regarding a refill of tussionex. States that this is the only cough medication that works for her. She has been on fentanyl patches and buprenorphine patches last year when she was seeing a different pain medicine specialist.     Current Medication: Outpatient Encounter Medications as of 04/22/2022   Medication Sig   azithromycin (ZITHROMAX) 250 MG tablet Take one tab a day for 10 days for uri   benzonatate (TESSALON) 200 MG capsule Take 1 capsule (200 mg total) by mouth 2 (two) times daily as needed for cough.   albuterol (VENTOLIN HFA) 108 (90 Base) MCG/ACT inhaler Inhale 2 puffs into the lungs every 6 (six) hours as needed for up to 7 days for wheezing or shortness of breath.   betamethasone dipropionate 0.05 % cream Apply topically 2 (two) times daily.   chlorpheniramine-HYDROcodone (TUSSIONEX PENNKINETIC ER) 10-8 MG/5ML Take 5 mLs by mouth every 12 (twelve) hours as needed for cough.   dextromethorphan-guaiFENesin (MUCINEX DM) 30-600 MG 12hr tablet Take 1 tablet by mouth 2 (two) times daily.   diazepam (VALIUM) 5 MG tablet Take 1 tablet (5 mg total) by mouth every 12 (twelve) hours as needed for anxiety. Or sleep   ergocalciferol (VITAMIN D2) 1.25 MG (50000 UT) capsule Take 1 capsule (50,000 Units total) by mouth every 14 (fourteen) days.   meloxicam (MOBIC) 7.5 MG tablet Take 7.5 mg by mouth daily.   omeprazole (PRILOSEC) 40 MG capsule Take 1 capsule (40 mg total) by mouth daily.   oxyCODONE-acetaminophen (PERCOCET) 10-325 MG tablet Take by mouth. 1 TAB BY MOUTH EVERY 12 HRS X 10 DAYS, 1 TAB EVERY 8 HRS X 10 DAYS THEN 1 TAB EVERY 6 HRS X 10 DAYS   predniSONE (DELTASONE) 10 MG tablet Take one tab 3 x day for 3 days, then take one tab 2 x a day for 3 days and then take one tab a day for 3 days for copd   [  DISCONTINUED] diazepam (VALIUM) 5 MG tablet Take 1 tablet (5 mg total) by mouth every 12 (twelve) hours as needed for anxiety. Or sleep   [DISCONTINUED] ergocalciferol (VITAMIN D2) 1.25 MG (50000 UT) capsule Take 1 capsule (50,000 Units total) by mouth every 14 (fourteen) days.   [DISCONTINUED] omeprazole (PRILOSEC) 40 MG capsule Take 1 capsule (40 mg total) by mouth daily.   Facility-Administered Encounter Medications as of 04/22/2022  Medication   cyanocobalamin ((VITAMIN B-12))  injection 1,000 mcg   cyanocobalamin ((VITAMIN B-12)) injection 1,000 mcg    Surgical History: Past Surgical History:  Procedure Laterality Date   CHOLECYSTECTOMY  1996   COLON RESECTION     dilatation and curettage     TONSILLECTOMY Bilateral     Medical History: Past Medical History:  Diagnosis Date   Allergic rhinitis    Arthritis    Bronchitis    Hyperlipidemia    Migraines    RSV (acute bronchiolitis due to respiratory syncytial virus)     Family History: Family History  Problem Relation Age of Onset   Hypertension Mother    Bladder Cancer Father        97 yrs old when passed away    Social History   Socioeconomic History   Marital status: Married    Spouse name: Not on file   Number of children: Not on file   Years of education: Not on file   Highest education level: Not on file  Occupational History   Not on file  Tobacco Use   Smoking status: Never   Smokeless tobacco: Never  Substance and Sexual Activity   Alcohol use: No   Drug use: No   Sexual activity: Not on file  Other Topics Concern   Not on file  Social History Narrative   Not on file   Social Determinants of Health   Financial Resource Strain: Not on file  Food Insecurity: Not on file  Transportation Needs: Not on file  Physical Activity: Not on file  Stress: Not on file  Social Connections: Not on file  Intimate Partner Violence: Not on file      Review of Systems  Constitutional:  Positive for chills and fatigue. Negative for unexpected weight change.  HENT:  Positive for congestion, postnasal drip, rhinorrhea, sinus pressure and sore throat. Negative for sinus pain, sneezing and trouble swallowing.   Respiratory:  Positive for cough and shortness of breath. Negative for chest tightness and wheezing.   Cardiovascular: Negative.  Negative for chest pain and palpitations.  Gastrointestinal: Negative.   Musculoskeletal:  Positive for arthralgias and myalgias.   Psychiatric/Behavioral:  Positive for sleep disturbance. Negative for behavioral problems (Depression), self-injury and suicidal ideas. The patient is nervous/anxious.     Vital Signs: Temp 99 F (37.2 C)   Resp 16   Ht 5\' 3"  (1.6 m)   BMI 25.47 kg/m    Observation/Objective: She is alert and oriented and engages in conversation appropriately. She does not sound as though she is in any acute distress over telephone call.     Assessment/Plan: 1. Chronic cough - azithromycin (ZITHROMAX) 250 MG tablet; Take one tab a day for 10 days for uri  Dispense: 10 tablet; Refill: 0 - Ambulatory referral to Pulmonology - benzonatate (TESSALON) 200 MG capsule; Take 1 capsule (200 mg total) by mouth 2 (two) times daily as needed for cough.  Dispense: 30 capsule; Refill: 0  2. Acute non-recurrent frontal sinusitis Empiric antibiotic treatment prescribed.  - azithromycin (ZITHROMAX) 250  MG tablet; Take one tab a day for 10 days for uri  Dispense: 10 tablet; Refill: 0  3. Elevated hemidiaphragm May be contributing to chronic cough - Ambulatory referral to Pulmonology  4. Medication refill - omeprazole (PRILOSEC) 40 MG capsule; Take 1 capsule (40 mg total) by mouth daily.  Dispense: 90 capsule; Refill: 1 - ergocalciferol (VITAMIN D2) 1.25 MG (50000 UT) capsule; Take 1 capsule (50,000 Units total) by mouth every 14 (fourteen) days.  Dispense: 4 capsule; Refill: 3 - diazepam (VALIUM) 5 MG tablet; Take 1 tablet (5 mg total) by mouth every 12 (twelve) hours as needed for anxiety. Or sleep  Dispense: 60 tablet; Refill: 0  5. Opioid use disorder No more tussionex due to hydrocodone component. She receives 150 tabs per month of percocet 10 from her pain medicine specialist. Patient was informed that she cannot have any more tussionex prescribed from providers at [facility].   General Counseling: Morgan Abbott understanding of the findings of today's phone visit and agrees with plan of  treatment. I have discussed any further diagnostic evaluation that may be needed or ordered today. We also reviewed her medications today. she has been encouraged to call the office with any questions or concerns that should arise related to todays visit.  Return if symptoms worsen or fail to improve, for referral to pulmonology.   Orders Placed This Encounter  Procedures   Ambulatory referral to Pulmonology    Meds ordered this encounter  Medications   azithromycin (ZITHROMAX) 250 MG tablet    Sig: Take one tab a day for 10 days for uri    Dispense:  10 tablet    Refill:  0   benzonatate (TESSALON) 200 MG capsule    Sig: Take 1 capsule (200 mg total) by mouth 2 (two) times daily as needed for cough.    Dispense:  30 capsule    Refill:  0   omeprazole (PRILOSEC) 40 MG capsule    Sig: Take 1 capsule (40 mg total) by mouth daily.    Dispense:  90 capsule    Refill:  1   ergocalciferol (VITAMIN D2) 1.25 MG (50000 UT) capsule    Sig: Take 1 capsule (50,000 Units total) by mouth every 14 (fourteen) days.    Dispense:  4 capsule    Refill:  3   diazepam (VALIUM) 5 MG tablet    Sig: Take 1 tablet (5 mg total) by mouth every 12 (twelve) hours as needed for anxiety. Or sleep    Dispense:  60 tablet    Refill:  0   Return if symptoms worsen or fail to improve, for referral to pulmonology.   Time spent:20 Minutes Time spent with patient included reviewing progress notes, labs, imaging studies, and discussing plan for follow up.  [place] Controlled Substance Database was reviewed by me for overdose risk score (ORS) if appropriate.  This patient was seen by Sallyanne Kuster, FNP-C in collaboration with Dr. Beverely Risen as a part of collaborative care agreement.  Modest Draeger R. Tedd Sias, MSN, FNP-C Internal medicine

## 2022-04-26 DIAGNOSIS — G894 Chronic pain syndrome: Secondary | ICD-10-CM | POA: Diagnosis not present

## 2022-04-26 DIAGNOSIS — M47816 Spondylosis without myelopathy or radiculopathy, lumbar region: Secondary | ICD-10-CM | POA: Diagnosis not present

## 2022-04-26 DIAGNOSIS — M47812 Spondylosis without myelopathy or radiculopathy, cervical region: Secondary | ICD-10-CM | POA: Diagnosis not present

## 2022-04-26 DIAGNOSIS — M7551 Bursitis of right shoulder: Secondary | ICD-10-CM | POA: Diagnosis not present

## 2022-04-29 ENCOUNTER — Ambulatory Visit: Payer: Medicare Other | Admitting: Nurse Practitioner

## 2022-05-02 ENCOUNTER — Encounter: Payer: Self-pay | Admitting: Nurse Practitioner

## 2022-05-02 ENCOUNTER — Ambulatory Visit (INDEPENDENT_AMBULATORY_CARE_PROVIDER_SITE_OTHER): Payer: Medicare Other | Admitting: Nurse Practitioner

## 2022-05-02 VITALS — BP 149/73 | HR 101 | Temp 98.0°F | Resp 16 | Ht 63.0 in | Wt 154.6 lb

## 2022-05-02 DIAGNOSIS — L03116 Cellulitis of left lower limb: Secondary | ICD-10-CM | POA: Diagnosis not present

## 2022-05-02 DIAGNOSIS — L309 Dermatitis, unspecified: Secondary | ICD-10-CM | POA: Diagnosis not present

## 2022-05-02 MED ORDER — PENICILLIN V POTASSIUM 500 MG PO TABS: 500.0000 mg | ORAL_TABLET | Freq: Three times a day (TID) | ORAL | 0 refills | Status: AC

## 2022-05-02 MED ORDER — CLOBETASOL PROPIONATE 0.05 % EX OINT: 1.0000 | TOPICAL_OINTMENT | Freq: Two times a day (BID) | CUTANEOUS | 1 refills | Status: DC

## 2022-05-02 NOTE — Progress Notes (Signed)
[facility] [address] [place], [place] [address]  Internal MEDICINE  Office Visit Note  Patient Name: Morgan Abbott  [id number]  [id number]  Date of Service: 05/02/2022  Chief Complaint  Patient presents with   Rash    Rash on left leg     HPI Morgan Abbott presents for an acute sick visit forrash on left leg has returned --previously she had a dermatitis rash develop from unknown cause.  --this time part of the rash has an open area that is red and swollen and possibly infected.  --clobetasol ointment cleared up the area before    Current Medication:  Outpatient Encounter Medications as of 05/02/2022  Medication Sig   azithromycin (ZITHROMAX) 250 MG tablet Take one tab a day for 10 days for uri   benzonatate (TESSALON) 200 MG capsule Take 1 capsule (200 mg total) by mouth 2 (two) times daily as needed for cough.   betamethasone dipropionate 0.05 % cream Apply topically 2 (two) times daily.   chlorpheniramine-HYDROcodone (TUSSIONEX PENNKINETIC ER) 10-8 MG/5ML Take 5 mLs by mouth every 12 (twelve) hours as needed for cough.   clobetasol ointment (TEMOVATE) 0.05 % Apply 1 Application topically 2 (two) times daily. To rash on legs until resolved.   dextromethorphan-guaiFENesin (MUCINEX DM) 30-600 MG 12hr tablet Take 1 tablet by mouth 2 (two) times daily.   diazepam (VALIUM) 5 MG tablet Take 1 tablet (5 mg total) by mouth every 12 (twelve) hours as needed for anxiety. Or sleep   ergocalciferol (VITAMIN D2) 1.25 MG (50000 UT) capsule Take 1 capsule (50,000 Units total) by mouth every 14 (fourteen) days.   meloxicam (MOBIC) 7.5 MG tablet Take 7.5 mg by mouth daily.   omeprazole (PRILOSEC) 40 MG capsule Take 1 capsule (40 mg total) by mouth daily.   oxyCODONE-acetaminophen (PERCOCET) 10-325 MG tablet Take by mouth. 1 TAB BY MOUTH EVERY 12 HRS X 10 DAYS, 1 TAB EVERY 8 HRS X 10 DAYS THEN 1 TAB EVERY 6 HRS X 10 DAYS   penicillin v potassium (VEETID) 500 MG tablet Take 1 tablet  (500 mg total) by mouth 3 (three) times daily for 7 days.   predniSONE (DELTASONE) 10 MG tablet Take one tab 3 x day for 3 days, then take one tab 2 x a day for 3 days and then take one tab a day for 3 days for copd   albuterol (VENTOLIN HFA) 108 (90 Base) MCG/ACT inhaler Inhale 2 puffs into the lungs every 6 (six) hours as needed for up to 7 days for wheezing or shortness of breath.   Facility-Administered Encounter Medications as of 05/02/2022  Medication   cyanocobalamin ((VITAMIN B-12)) injection 1,000 mcg   cyanocobalamin ((VITAMIN B-12)) injection 1,000 mcg      Medical History: Past Medical History:  Diagnosis Date   Allergic rhinitis    Arthritis    Bronchitis    Hyperlipidemia    Migraines    RSV (acute bronchiolitis due to respiratory syncytial virus)      Vital Signs: BP (!) 149/73   Pulse (!) 101   Temp 98 F (36.7 C)   Resp 16   Ht 5\' 3"  (1.6 m)   Wt 154 lb 9.6 oz (70.1 kg)   SpO2 95%   BMI 27.39 kg/m    Review of Systems  Respiratory:  Positive for cough. Negative for chest tightness, shortness of breath and wheezing.   Cardiovascular: Negative.  Negative for chest pain and palpitations.  Gastrointestinal: Negative.  Musculoskeletal:  Positive for arthralgias.  Skin:  Positive for rash and wound.    Physical Exam Vitals reviewed.  Constitutional:      General: She is not in acute distress.    Appearance: Normal appearance. She is not ill-appearing.  HENT:     Head: Normocephalic and atraumatic.  Cardiovascular:     Rate and Rhythm: Normal rate and regular rhythm.  Skin:    Findings: Erythema (left lower leg), lesion and rash (left lower leg) present.  Neurological:     Mental Status: She is alert and oriented to person, place, and time.       Assessment/Plan: 1. Cellulitis of left lower extremity Empiric antibiotic treatment prescribed for possible developing cellulitis/skin infection on left lower leg.  - penicillin v potassium (VEETID)  500 MG tablet; Take 1 tablet (500 mg total) by mouth 3 (three) times daily for 7 days.  Dispense: 21 tablet; Refill: 0  2. Dermatitis due to unknown cause Apply clobetasol ointment to rash on unbroken skin 1-2 times daily until resolved.  - clobetasol ointment (TEMOVATE) 0.05 %; Apply 1 Application topically 2 (two) times daily. To rash on legs until resolved.  Dispense: 60 g; Refill: 1   General Counseling: Morgan Abbott verbalizes understanding of the findings of todays visit and agrees with plan of treatment. I have discussed any further diagnostic evaluation that may be needed or ordered today. We also reviewed her medications today. she has been encouraged to call the office with any questions or concerns that should arise related to todays visit.    Counseling:    No orders of the defined types were placed in this encounter.   Meds ordered this encounter  Medications   clobetasol ointment (TEMOVATE) 0.05 %    Sig: Apply 1 Application topically 2 (two) times daily. To rash on legs until resolved.    Dispense:  60 g    Refill:  1    Please fill asap.   penicillin v potassium (VEETID) 500 MG tablet    Sig: Take 1 tablet (500 mg total) by mouth 3 (three) times daily for 7 days.    Dispense:  21 tablet    Refill:  0    Please fill prescription asap    Return if symptoms worsen or fail to improve.   Controlled Substance Database was reviewed by me for overdose risk score (ORS)  Time spent:20 Minutes Time spent with patient included reviewing progress notes, labs, imaging studies, and discussing plan for follow up.   This patient was seen by Sallyanne Kuster, FNP-C in collaboration with Dr. Beverely Risen as a part of collaborative care agreement.  Teshawn Moan R. Tedd Sias, MSN, FNP-C Internal Medicine

## 2022-05-03 ENCOUNTER — Encounter: Payer: Self-pay | Admitting: Nurse Practitioner

## 2022-05-23 DIAGNOSIS — Z79891 Long term (current) use of opiate analgesic: Secondary | ICD-10-CM | POA: Diagnosis not present

## 2022-05-23 DIAGNOSIS — M47812 Spondylosis without myelopathy or radiculopathy, cervical region: Secondary | ICD-10-CM | POA: Diagnosis not present

## 2022-05-23 DIAGNOSIS — M47816 Spondylosis without myelopathy or radiculopathy, lumbar region: Secondary | ICD-10-CM | POA: Diagnosis not present

## 2022-05-23 DIAGNOSIS — M7551 Bursitis of right shoulder: Secondary | ICD-10-CM | POA: Diagnosis not present

## 2022-05-23 DIAGNOSIS — G894 Chronic pain syndrome: Secondary | ICD-10-CM | POA: Diagnosis not present

## 2022-06-13 ENCOUNTER — Other Ambulatory Visit: Payer: Self-pay | Admitting: Internal Medicine

## 2022-06-13 ENCOUNTER — Telehealth: Payer: Self-pay

## 2022-06-13 DIAGNOSIS — Z760 Encounter for issue of repeat prescription: Secondary | ICD-10-CM

## 2022-06-13 MED ORDER — DIAZEPAM 5 MG PO TABS: 5.0000 mg | ORAL_TABLET | Freq: Two times a day (BID) | ORAL | 0 refills | Status: DC | PRN

## 2022-06-13 NOTE — Telephone Encounter (Signed)
There is no UDS on her, she has been on long term Narcotics by another physician. Will refill for one month, she needs f/u in 4 weeks for med refill and uds

## 2022-06-14 ENCOUNTER — Telehealth: Payer: Self-pay | Admitting: Nurse Practitioner

## 2022-06-14 NOTE — Telephone Encounter (Signed)
Lvm to schedule follow up appointment-Toni 

## 2022-06-25 DIAGNOSIS — M47816 Spondylosis without myelopathy or radiculopathy, lumbar region: Secondary | ICD-10-CM | POA: Diagnosis not present

## 2022-06-25 DIAGNOSIS — M7551 Bursitis of right shoulder: Secondary | ICD-10-CM | POA: Diagnosis not present

## 2022-06-25 DIAGNOSIS — G894 Chronic pain syndrome: Secondary | ICD-10-CM | POA: Diagnosis not present

## 2022-06-25 DIAGNOSIS — M47812 Spondylosis without myelopathy or radiculopathy, cervical region: Secondary | ICD-10-CM | POA: Diagnosis not present

## 2022-07-05 ENCOUNTER — Encounter: Payer: Self-pay | Admitting: Nurse Practitioner

## 2022-07-11 ENCOUNTER — Institutional Professional Consult (permissible substitution): Payer: Medicare Other | Admitting: Pulmonary Disease

## 2022-07-17 ENCOUNTER — Ambulatory Visit: Payer: Medicare Other | Admitting: Nurse Practitioner

## 2022-07-24 DIAGNOSIS — M47816 Spondylosis without myelopathy or radiculopathy, lumbar region: Secondary | ICD-10-CM | POA: Diagnosis not present

## 2022-07-24 DIAGNOSIS — M7551 Bursitis of right shoulder: Secondary | ICD-10-CM | POA: Diagnosis not present

## 2022-07-24 DIAGNOSIS — G894 Chronic pain syndrome: Secondary | ICD-10-CM | POA: Diagnosis not present

## 2022-07-24 DIAGNOSIS — M47812 Spondylosis without myelopathy or radiculopathy, cervical region: Secondary | ICD-10-CM | POA: Diagnosis not present

## 2022-08-15 ENCOUNTER — Ambulatory Visit: Payer: Medicare Other | Admitting: Nurse Practitioner

## 2022-08-21 DIAGNOSIS — M47812 Spondylosis without myelopathy or radiculopathy, cervical region: Secondary | ICD-10-CM | POA: Diagnosis not present

## 2022-08-21 DIAGNOSIS — M47816 Spondylosis without myelopathy or radiculopathy, lumbar region: Secondary | ICD-10-CM | POA: Diagnosis not present

## 2022-08-21 DIAGNOSIS — G894 Chronic pain syndrome: Secondary | ICD-10-CM | POA: Diagnosis not present

## 2022-08-21 DIAGNOSIS — M7551 Bursitis of right shoulder: Secondary | ICD-10-CM | POA: Diagnosis not present

## 2022-09-04 ENCOUNTER — Encounter: Payer: Self-pay | Admitting: Nurse Practitioner

## 2022-09-04 ENCOUNTER — Ambulatory Visit (INDEPENDENT_AMBULATORY_CARE_PROVIDER_SITE_OTHER): Payer: Medicare Other | Admitting: Nurse Practitioner

## 2022-09-04 VITALS — BP 133/68 | HR 85 | Temp 98.1°F | Resp 16 | Ht 63.0 in | Wt 146.0 lb

## 2022-09-04 DIAGNOSIS — E782 Mixed hyperlipidemia: Secondary | ICD-10-CM

## 2022-09-04 DIAGNOSIS — F1190 Opioid use, unspecified, uncomplicated: Secondary | ICD-10-CM

## 2022-09-04 DIAGNOSIS — E559 Vitamin D deficiency, unspecified: Secondary | ICD-10-CM

## 2022-09-04 DIAGNOSIS — Z1211 Encounter for screening for malignant neoplasm of colon: Secondary | ICD-10-CM

## 2022-09-04 DIAGNOSIS — Z760 Encounter for issue of repeat prescription: Secondary | ICD-10-CM

## 2022-09-04 DIAGNOSIS — Z1231 Encounter for screening mammogram for malignant neoplasm of breast: Secondary | ICD-10-CM

## 2022-09-04 DIAGNOSIS — Z0001 Encounter for general adult medical examination with abnormal findings: Secondary | ICD-10-CM

## 2022-09-04 DIAGNOSIS — E538 Deficiency of other specified B group vitamins: Secondary | ICD-10-CM

## 2022-09-04 DIAGNOSIS — R300 Dysuria: Secondary | ICD-10-CM

## 2022-09-04 LAB — POCT URINALYSIS DIPSTICK
Bilirubin, UA: NEGATIVE
Blood, UA: NEGATIVE
Clarity, UA: NEGATIVE
Color, UA: NEGATIVE
Glucose, UA: NEGATIVE
Ketones, UA: NEGATIVE
Leukocytes, UA: NEGATIVE
Nitrite, UA: NEGATIVE
Protein, UA: NEGATIVE
Spec Grav, UA: 1.01 (ref 1.010–1.025)
Urobilinogen, UA: 0.2 E.U./dL
pH, UA: 5 (ref 5.0–8.0)

## 2022-09-04 LAB — POCT URINE DRUG SCREEN
Methylenedioxyamphetamine: NOT DETECTED
POC Amphetamine UR: NOT DETECTED
POC BENZODIAZEPINES UR: POSITIVE — AB
POC Barbiturate UR: NOT DETECTED
POC Cocaine UR: NOT DETECTED
POC DRUG SCREEN OXIDANTS URINE: NORMAL
POC Ecstasy UR: NOT DETECTED
POC Marijuana UR: NOT DETECTED
POC Methadone UR: NOT DETECTED
POC Methamphetamine UR: NOT DETECTED
POC Opiate Ur: NOT DETECTED
POC Oxycodone UR: POSITIVE — AB
POC PH URINE: NORMAL
POC PHENCYCLIDINE UR: NOT DETECTED
POC SPECIFIC GRAVITY URINE: NORMAL
POC TRICYCLICS UR: NOT DETECTED

## 2022-09-04 MED ORDER — DIAZEPAM 5 MG PO TABS: 5.0000 mg | ORAL_TABLET | Freq: Two times a day (BID) | ORAL | 2 refills | Status: DC | PRN

## 2022-09-04 MED ORDER — OMEPRAZOLE 40 MG PO CPDR: 40.0000 mg | DELAYED_RELEASE_CAPSULE | Freq: Every day | ORAL | 6 refills | Status: DC

## 2022-09-04 NOTE — Progress Notes (Signed)
[facility] [place], [place] [address]  Internal MEDICINE  Office Visit Note  Patient Name: Morgan Abbott  [id number]  [id number]  Date of Service: 09/04/2022  Chief Complaint  Patient presents with   Medicare Wellness   Hyperlipidemia    HPI Morgan Abbott presents for an annual well visit and physical exam.  Well-appearing 76 y.o. female with asthma, GERD, low B12, low vitamin D, GAD, chronic pain, and depression.  Routine CRC screening: never has done one, opted for cologuard Routine mammogram: overdue DEXA scan: has done previously Labs: due for routine labs New or worsening pain: chronic back pain and knee pain Other concerns: none      09/04/2022    9:12 AM 08/30/2021    2:29 PM 08/17/2020   11:45 AM  MMSE - Mini Mental State Exam  Orientation to time _0 Orientation to Place _1 Registration _2 Attention/ Calculation _3 Recall _4 Language- name 2 objects _5 Language- repeat _6 Language- follow 3 step command _7 Language- read & follow direction _8 Write a sentence _9 Copy design _10 Total score _11 Functional Status Survey: Is the patient deaf or have difficulty hearing?: No Does the patient have difficulty seeing, even when wearing glasses/contacts?: No Does the patient have difficulty concentrating, remembering, or making decisions?: No Does the patient have difficulty walking or climbing stairs?: No Does the patient have difficulty dressing or bathing?: No Does the patient have difficulty doing errands alone such as visiting a doctor's office or shopping?: No     06/26/2021    7:15 PM 06/27/2021    8:00 AM 08/30/2021    2:28 PM 11/29/2021    3:48 PM 09/04/2022    9:10 AM  Fall Risk  Falls in the past year?   0 0 0  Was there an injury with Fall?     0  Fall Risk Category Calculator     0  Fall Risk Category     Low  Patient Fall Risk Level Moderate fall risk Moderate fall risk  Low fall risk Low fall risk Low fall risk  Patient at Risk for Falls Due to   No Fall Risks No Fall Risks No Fall Risks  Fall risk Follow up   Falls evaluation completed Falls evaluation completed Falls evaluation completed       09/04/2022    9:10 AM  Depression screen PHQ 2/9  Decreased Interest 0  Down, Depressed, Hopeless 0  PHQ - 2 Score 0        Current Medication: Outpatient Encounter Medications as of 09/04/2022  Medication Sig   azithromycin (ZITHROMAX) 250 MG tablet Take one tab a day for 10 days for uri   benzonatate (TESSALON) 200 MG capsule Take 1 capsule (200 mg total) by mouth 2 (two) times daily as needed for cough.   betamethasone dipropionate 0.05 % cream Apply topically 2 (two) times daily.   chlorpheniramine-HYDROcodone (TUSSIONEX PENNKINETIC ER) 10-8 MG/5ML Take 5 mLs by mouth every 12 (twelve) hours as needed for cough.   clobetasol ointment (TEMOVATE) 2.97 % Apply 1 Application topically 2 (two) times daily. To rash on legs until resolved.   dextromethorphan-guaiFENesin (MUCINEX DM) 30-600 MG 12hr tablet Take 1 tablet by mouth 2 (two) times daily.   ergocalciferol (VITAMIN  D2) 1.25 MG (50000 UT) capsule Take 1 capsule (50,000 Units total) by mouth every 14 (fourteen) days.   meloxicam (MOBIC) 7.5 MG tablet Take 7.5 mg by mouth daily.   oxyCODONE-acetaminophen (PERCOCET) 10-325 MG tablet Take by mouth. 1 TAB BY MOUTH EVERY 12 HRS X 10 DAYS, 1 TAB EVERY 8 HRS X 10 DAYS THEN 1 TAB EVERY 6 HRS X 10 DAYS   predniSONE (DELTASONE) 10 MG tablet Take one tab 3 x day for 3 days, then take one tab 2 x a day for 3 days and then take one tab a day for 3 days for copd   [DISCONTINUED] diazepam (VALIUM) 5 MG tablet Take 1 tablet (5 mg total) by mouth every 12 (twelve) hours as needed for anxiety. Or sleep   [DISCONTINUED] omeprazole (PRILOSEC) 40 MG capsule Take 1 capsule (40 mg total) by mouth daily.   albuterol (VENTOLIN HFA) 108 (90 Base) MCG/ACT inhaler Inhale 2 puffs  into the lungs every 6 (six) hours as needed for up to 7 days for wheezing or shortness of breath.   diazepam (VALIUM) 5 MG tablet Take 1 tablet (5 mg total) by mouth every 12 (twelve) hours as needed for anxiety. Or sleep   omeprazole (PRILOSEC) 40 MG capsule Take 1 capsule (40 mg total) by mouth daily.   Facility-Administered Encounter Medications as of 09/04/2022  Medication   cyanocobalamin ((VITAMIN B-12)) injection 1,000 mcg   cyanocobalamin ((VITAMIN B-12)) injection 1,000 mcg    Surgical History: Past Surgical History:  Procedure Laterality Date   CHOLECYSTECTOMY  1996   COLON RESECTION     dilatation and curettage     TONSILLECTOMY Bilateral     Medical History: Past Medical History:  Diagnosis Date   Allergic rhinitis    Arthritis    Bronchitis    Hyperlipidemia    Migraines    RSV (acute bronchiolitis due to respiratory syncytial virus)     Family History: Family History  Problem Relation Age of Onset   Hypertension Mother    Bladder Cancer Father        44 yrs old when passed away    Social History   Socioeconomic History   Marital status: Married    Spouse name: Not on file   Number of children: Not on file   Years of education: Not on file   Highest education level: Not on file  Occupational History   Not on file  Tobacco Use   Smoking status: Never   Smokeless tobacco: Never  Substance and Sexual Activity   Alcohol use: No   Drug use: No   Sexual activity: Not on file  Other Topics Concern   Not on file  Social History Narrative   Not on file   Social Determinants of Health   Financial Resource Strain: Not on file  Food Insecurity: Not on file  Transportation Needs: Not on file  Physical Activity: Not on file  Stress: Not on file  Social Connections: Not on file  Intimate Partner Violence: Not on file      Review of Systems  Constitutional:  Negative for activity change, appetite change, chills, fatigue, fever and unexpected  weight change.  HENT: Negative.  Negative for congestion, ear pain, rhinorrhea, sore throat and trouble swallowing.   Eyes: Negative.   Respiratory: Negative.  Negative for cough, chest tightness, shortness of breath and wheezing.   Cardiovascular: Negative.  Negative for chest pain.  Gastrointestinal: Negative.  Negative for abdominal pain, blood in  stool, constipation, diarrhea, nausea and vomiting.  Endocrine: Negative.   Genitourinary: Negative.  Negative for difficulty urinating, dysuria, frequency, hematuria and urgency.  Musculoskeletal: Negative.  Negative for arthralgias, back pain, joint swelling, myalgias and neck pain.  Skin: Negative.  Negative for rash and wound.  Allergic/Immunologic: Negative.  Negative for immunocompromised state.  Neurological: Negative.  Negative for dizziness, seizures, numbness and headaches.  Hematological: Negative.   Psychiatric/Behavioral: Negative.  Negative for behavioral problems, self-injury and suicidal ideas. The patient is not nervous/anxious.     Vital Signs: BP 133/68   Pulse 85   Temp 98.1 F (36.7 C)   Resp 16   Ht _0  (1.6 m)   Wt 146 lb (66.2 kg)   SpO2 95%   BMI 25.86 kg/m    Physical Exam Vitals reviewed.  Constitutional:      General: She is awake. She is not in acute distress.    Appearance: Normal appearance. She is well-developed, well-groomed and normal weight. She is not ill-appearing or diaphoretic.  HENT:     Head: Normocephalic and atraumatic.     Right Ear: Tympanic membrane, ear canal and external ear normal.     Left Ear: Tympanic membrane, ear canal and external ear normal.     Nose: Nose normal. No congestion or rhinorrhea.     Mouth/Throat:     Lips: Pink.     Mouth: Mucous membranes are moist.     Pharynx: Oropharynx is clear. Uvula midline. No oropharyngeal exudate.  Eyes:     General: Lids are normal. Vision grossly intact. Gaze aligned appropriately. No scleral icterus.       Right eye: No  discharge.        Left eye: No discharge.     Conjunctiva/sclera: Conjunctivae normal.     Pupils: Pupils are equal, round, and reactive to light.     Funduscopic exam:    Right eye: Red reflex present.        Left eye: Red reflex present. Neck:     Thyroid: No thyromegaly.     Vascular: No JVD.     Trachea: No tracheal deviation.  Cardiovascular:     Rate and Rhythm: Normal rate and regular rhythm.     Pulses: Normal pulses.     Heart sounds: Normal heart sounds, S1 normal and S2 normal. No murmur heard.    No friction rub. No gallop.  Pulmonary:     Effort: Pulmonary effort is normal. No accessory muscle usage or respiratory distress.     Breath sounds: Normal breath sounds and air entry. No stridor. No wheezing or rales.  Chest:     Chest wall: No tenderness.     Comments: Declined clincal breast exam Abdominal:     General: Bowel sounds are normal. There is no distension.     Palpations: Abdomen is soft. There is no mass.     Tenderness: There is no abdominal tenderness. There is no guarding or rebound.  Musculoskeletal:        General: No tenderness or deformity. Normal range of motion.     Cervical back: Normal range of motion and neck supple.     Right lower leg: No edema.     Left lower leg: No edema.  Lymphadenopathy:     Cervical: No cervical adenopathy.  Skin:    General: Skin is warm and dry.     Capillary Refill: Capillary refill takes less than 2 seconds.     Coloration: Skin  is not pale.     Findings: No erythema or rash.  Neurological:     Mental Status: She is alert and oriented to person, place, and time.     Cranial Nerves: No cranial nerve deficit.     Motor: No abnormal muscle tone.     Coordination: Coordination normal.     Gait: Gait normal.     Deep Tendon Reflexes: Reflexes are normal and symmetric.  Psychiatric:        Mood and Affect: Mood normal.        Behavior: Behavior normal. Behavior is cooperative.        Thought Content: Thought  content normal.        Judgment: Judgment normal.       Assessment/Plan: 1. Encounter for general adult medical examination with abnormal findings Age-appropriate preventive screenings and vaccinations discussed, annual physical exam completed. Routine labs for health maintenance ordered, see below. PHM updated.  - CBC with Differential/Platelet - CMP14+EGFR - Lipid Profile - B12 and Folate Panel - Vitamin D (25 hydroxy)  2. Mixed hyperlipidemia Routine labs ordered - CBC with Differential/Platelet - CMP14+EGFR - Lipid Profile  3. Vitamin D deficiency Routine lab ordered - Vitamin D (25 hydroxy)  4. B12 deficiency Routine labs ordered - B12 and Folate Panel  5. Dysuria Urinalysis negative, urine culture sent - UA/M w/rflx Culture, Routine - POCT urinalysis dipstick  6. Medication refill - omeprazole (PRILOSEC) 40 MG capsule; Take 1 capsule (40 mg total) by mouth daily.  Dispense: 30 capsule; Refill: 6 - diazepam (VALIUM) 5 MG tablet; Take 1 tablet (5 mg total) by mouth every 12 (twelve) hours as needed for anxiety. Or sleep  Dispense: 60 tablet; Refill: 2  7. Encounter for screening mammogram for malignant neoplasm of breast Routine mammogram ordered  - MM 3D SCREEN BREAST BILATERAL; Future  8. Screening for colorectal cancer Cologuard ordered - Cologuard  9. Opioid use disorder UDS done in office and positive for benzo and opioids which is consistent with current prescriptions  - POCT Urine Drug Screen     General Counseling: demoni parmar understanding of the findings of todays visit and agrees with plan of treatment. I have discussed any further diagnostic evaluation that may be needed or ordered today. We also reviewed her medications today. she has been encouraged to call the office with any questions or concerns that should arise related to todays visit.    Orders Placed This Encounter  Procedures   MM 3D SCREEN BREAST BILATERAL   UA/M w/rflx  Culture, Routine   Cologuard   CBC with Differential/Platelet   CMP14+EGFR   Lipid Profile   B12 and Folate Panel   Vitamin D (25 hydroxy)   POCT urinalysis dipstick   POCT Urine Drug Screen    Meds ordered this encounter  Medications   omeprazole (PRILOSEC) 40 MG capsule    Sig: Take 1 capsule (40 mg total) by mouth daily.    Dispense:  30 capsule    Refill:  6   diazepam (VALIUM) 5 MG tablet    Sig: Take 1 tablet (5 mg total) by mouth every 12 (twelve) hours as needed for anxiety. Or sleep    Dispense:  60 tablet    Refill:  2    Return in about 12 weeks (around 11/27/2022) for F/U, Garnett Nunziata PCP, anxiety med refill.   Total time spent:30 Minutes Time spent includes review of chart, medications, test results, and follow up plan with the patient.   Controlled Substance Database was reviewed by me.  This patient was seen by Jonetta Osgood, FNP-C in collaboration with Dr. Clayborn Bigness as a part of collaborative care agreement.  Greer Koeppen R. Valetta Fuller, MSN, FNP-C Internal medicine

## 2022-09-09 LAB — UA/M W/RFLX CULTURE, ROUTINE
Bilirubin, UA: NEGATIVE
Glucose, UA: NEGATIVE
Ketones, UA: NEGATIVE
Nitrite, UA: NEGATIVE
RBC, UA: NEGATIVE
Specific Gravity, UA: 1.029 (ref 1.005–1.030)
Urobilinogen, Ur: 1 mg/dL (ref 0.2–1.0)
pH, UA: 5.5 (ref 5.0–7.5)

## 2022-09-09 LAB — MICROSCOPIC EXAMINATION
Bacteria, UA: NONE SEEN
Casts: NONE SEEN /lpf
RBC, Urine: NONE SEEN /hpf (ref 0–2)
WBC, UA: NONE SEEN /hpf (ref 0–5)

## 2022-09-09 LAB — URINE CULTURE, REFLEX

## 2022-09-19 DIAGNOSIS — M7551 Bursitis of right shoulder: Secondary | ICD-10-CM | POA: Diagnosis not present

## 2022-09-19 DIAGNOSIS — M47812 Spondylosis without myelopathy or radiculopathy, cervical region: Secondary | ICD-10-CM | POA: Diagnosis not present

## 2022-09-19 DIAGNOSIS — G894 Chronic pain syndrome: Secondary | ICD-10-CM | POA: Diagnosis not present

## 2022-09-19 DIAGNOSIS — M47816 Spondylosis without myelopathy or radiculopathy, lumbar region: Secondary | ICD-10-CM | POA: Diagnosis not present

## 2022-10-16 DIAGNOSIS — M47816 Spondylosis without myelopathy or radiculopathy, lumbar region: Secondary | ICD-10-CM | POA: Diagnosis not present

## 2022-10-16 DIAGNOSIS — M7551 Bursitis of right shoulder: Secondary | ICD-10-CM | POA: Diagnosis not present

## 2022-10-16 DIAGNOSIS — M47812 Spondylosis without myelopathy or radiculopathy, cervical region: Secondary | ICD-10-CM | POA: Diagnosis not present

## 2022-10-16 DIAGNOSIS — Z79891 Long term (current) use of opiate analgesic: Secondary | ICD-10-CM | POA: Diagnosis not present

## 2022-10-16 DIAGNOSIS — G894 Chronic pain syndrome: Secondary | ICD-10-CM | POA: Diagnosis not present

## 2022-10-21 ENCOUNTER — Telehealth (INDEPENDENT_AMBULATORY_CARE_PROVIDER_SITE_OTHER): Payer: Medicare Other | Admitting: Nurse Practitioner

## 2022-10-21 ENCOUNTER — Encounter: Payer: Self-pay | Admitting: Nurse Practitioner

## 2022-10-21 VITALS — Ht 63.0 in | Wt 142.0 lb

## 2022-10-21 DIAGNOSIS — J209 Acute bronchitis, unspecified: Secondary | ICD-10-CM | POA: Diagnosis not present

## 2022-10-21 DIAGNOSIS — J440 Chronic obstructive pulmonary disease with acute lower respiratory infection: Secondary | ICD-10-CM

## 2022-10-21 MED ORDER — AZITHROMYCIN 250 MG PO TABS: ORAL_TABLET | ORAL | 0 refills | Status: DC

## 2022-10-21 MED ORDER — PREDNISONE 10 MG PO TABS: ORAL_TABLET | ORAL | 0 refills | Status: DC

## 2022-10-21 NOTE — Progress Notes (Signed)
[facility] [place], [place] [address]  Internal MEDICINE  Telephone Visit  Patient Name: Morgan Abbott  [id number]  [id number]  Date of Service: 10/21/2022  I connected with the patient at 1250 by telephone and verified the patients identity using two identifiers.   I discussed the limitations, risks, security and privacy concerns of performing an evaluation and management service by telephone and the availability of in person appointments. I also discussed with the patient that there may be a patient responsible charge related to the service.  The patient expressed understanding and agrees to proceed.    Chief Complaint  Patient presents with   Telephone Screen    Cough, congestion, sore throat, chest hurts, headaches. [phone]   Telephone Assessment   Cough   Headache    HPI Pennye presents for a telehealth virtual visit for cough and congestion --sore throat, chest tightness, headaches, heaving --onset of symptoms was Thursday Did not test for covid   Current Medication: Outpatient Encounter Medications as of 10/21/2022  Medication Sig   benzonatate (TESSALON) 200 MG capsule Take 1 capsule (200 mg total) by mouth 2 (two) times daily as needed for cough.   betamethasone dipropionate 0.05 % cream Apply topically 2 (two) times daily.   chlorpheniramine-HYDROcodone (TUSSIONEX PENNKINETIC ER) 10-8 MG/5ML Take 5 mLs by mouth every 12 (twelve) hours as needed for cough.   clobetasol ointment (TEMOVATE) 5.78 % Apply 1 Application topically 2 (two) times daily. To rash on legs until resolved.   dextromethorphan-guaiFENesin (MUCINEX DM) 30-600 MG 12hr tablet Take 1 tablet by mouth 2 (two) times daily.   diazepam (VALIUM) 5 MG tablet Take 1 tablet (5 mg total) by mouth every 12 (twelve) hours as needed for anxiety. Or sleep   ergocalciferol (VITAMIN D2) 1.25 MG (50000 UT) capsule Take 1 capsule (50,000 Units total) by mouth every 14 (fourteen) days.    meloxicam (MOBIC) 7.5 MG tablet Take 7.5 mg by mouth daily.   omeprazole (PRILOSEC) 40 MG capsule Take 1 capsule (40 mg total) by mouth daily.   oxyCODONE-acetaminophen (PERCOCET) 10-325 MG tablet Take by mouth. 1 TAB BY MOUTH EVERY 12 HRS X 10 DAYS, 1 TAB EVERY 8 HRS X 10 DAYS THEN 1 TAB EVERY 6 HRS X 10 DAYS   [DISCONTINUED] azithromycin (ZITHROMAX) 250 MG tablet Take one tab a day for 10 days for uri   [DISCONTINUED] predniSONE (DELTASONE) 10 MG tablet Take one tab 3 x day for 3 days, then take one tab 2 x a day for 3 days and then take one tab a day for 3 days for copd   albuterol (VENTOLIN HFA) 108 (90 Base) MCG/ACT inhaler Inhale 2 puffs into the lungs every 6 (six) hours as needed for up to 7 days for wheezing or shortness of breath.   azithromycin (ZITHROMAX) 250 MG tablet Take one tab a day for 10 days for uri   predniSONE (DELTASONE) 10 MG tablet Take one tab 3 x day for 3 days, then take one tab 2 x a day for 3 days and then take one tab a day for 3 days for copd   Facility-Administered Encounter Medications as of 10/21/2022  Medication   cyanocobalamin ((VITAMIN B-12)) injection 1,000 mcg   cyanocobalamin ((VITAMIN B-12)) injection 1,000 mcg    Surgical History: Past Surgical History:  Procedure Laterality Date   CHOLECYSTECTOMY  1996   COLON RESECTION     dilatation and curettage     TONSILLECTOMY Bilateral  Medical History: Past Medical History:  Diagnosis Date   Allergic rhinitis    Arthritis    Bronchitis    Hyperlipidemia    Migraines    RSV (acute bronchiolitis due to respiratory syncytial virus)     Family History: Family History  Problem Relation Age of Onset   Hypertension Mother    Bladder Cancer Father        5 yrs old when passed away    Social History   Socioeconomic History   Marital status: Married    Spouse name: Not on file   Number of children: Not on file   Years of education: Not on file   Highest education level: Not on file   Occupational History   Not on file  Tobacco Use   Smoking status: Never   Smokeless tobacco: Never  Substance and Sexual Activity   Alcohol use: No   Drug use: No   Sexual activity: Not on file  Other Topics Concern   Not on file  Social History Narrative   Not on file   Social Determinants of Health   Financial Resource Strain: Not on file  Food Insecurity: Not on file  Transportation Needs: Not on file  Physical Activity: Not on file  Stress: Not on file  Social Connections: Not on file  Intimate Partner Violence: Not on file      Review of Systems  Constitutional:  Positive for fatigue.  HENT:  Positive for congestion, postnasal drip, sore throat and trouble swallowing.   Respiratory:  Positive for cough, chest tightness, shortness of breath and wheezing.   Cardiovascular: Negative.  Negative for chest pain and palpitations.  Neurological:  Positive for headaches.    Vital Signs: Ht 5\' 3"  (1.6 m)   Wt 142 lb (64.4 kg)   BMI 25.15 kg/m    Observation/Objective: She is alert and oriented and engages in conversation appropriately. No acute distress noted.    Assessment/Plan: 1. Acute bronchitis with COPD ([place]) Zpack and prednisone prescribed.  - azithromycin (ZITHROMAX) 250 MG tablet; Take one tab a day for 10 days for uri  Dispense: 10 tablet; Refill: 0 - predniSONE (DELTASONE) 10 MG tablet; Take one tab 3 x day for 3 days, then take one tab 2 x a day for 3 days and then take one tab a day for 3 days for copd  Dispense: 18 tablet; Refill: 0 - DG Chest 2 View; Future   General Counseling: rabia argote understanding of the findings of today's phone visit and agrees with plan of treatment. I have discussed any further diagnostic evaluation that may be needed or ordered today. We also reviewed her medications today. she has been encouraged to call the office with any questions or concerns that should arise related to todays visit.  Return if symptoms worsen  or fail to improve.   Orders Placed This Encounter  Procedures   DG Chest 2 View    Meds ordered this encounter  Medications   azithromycin (ZITHROMAX) 250 MG tablet    Sig: Take one tab a day for 10 days for uri    Dispense:  10 tablet    Refill:  0   predniSONE (DELTASONE) 10 MG tablet    Sig: Take one tab 3 x day for 3 days, then take one tab 2 x a day for 3 days and then take one tab a day for 3 days for copd    Dispense:  18 tablet    Refill:  0    Time spent:10 Minutes Time spent with patient included reviewing progress notes, labs, imaging studies, and discussing plan for follow up.  [place] Controlled Substance Database was reviewed by me for overdose risk score (ORS) if appropriate.  This patient was seen by Jonetta Osgood, FNP-C in collaboration with Dr. Clayborn Bigness as a part of collaborative care agreement.  Candita Borenstein R. Valetta Fuller, MSN, FNP-C Internal medicine

## 2022-10-25 ENCOUNTER — Ambulatory Visit
Admission: RE | Admit: 2022-10-25 | Discharge: 2022-10-25 | Disposition: A | Discharge status: Home / Self Care | Payer: Medicare Other | Attending: Nurse Practitioner | Admitting: Nurse Practitioner

## 2022-10-25 ENCOUNTER — Ambulatory Visit
Admission: RE | Admit: 2022-10-25 | Discharge: 2022-10-25 | Disposition: A | Discharge status: Home / Self Care | Payer: Medicare Other | Source: Ambulatory Visit | Attending: Nurse Practitioner | Admitting: Nurse Practitioner

## 2022-10-25 DIAGNOSIS — J209 Acute bronchitis, unspecified: Secondary | ICD-10-CM | POA: Diagnosis not present

## 2022-10-25 DIAGNOSIS — J440 Chronic obstructive pulmonary disease with acute lower respiratory infection: Secondary | ICD-10-CM | POA: Insufficient documentation

## 2022-10-25 DIAGNOSIS — J9811 Atelectasis: Secondary | ICD-10-CM | POA: Diagnosis not present

## 2022-10-25 DIAGNOSIS — R059 Cough, unspecified: Secondary | ICD-10-CM | POA: Diagnosis not present

## 2022-10-25 DIAGNOSIS — R0789 Other chest pain: Secondary | ICD-10-CM | POA: Diagnosis not present

## 2022-10-30 ENCOUNTER — Ambulatory Visit (INDEPENDENT_AMBULATORY_CARE_PROVIDER_SITE_OTHER): Payer: Medicare Other | Admitting: Nurse Practitioner

## 2022-10-30 ENCOUNTER — Encounter: Payer: Self-pay | Admitting: Nurse Practitioner

## 2022-10-30 VITALS — BP 126/78 | HR 89 | Temp 98.3°F | Resp 16 | Ht 63.0 in | Wt 136.2 lb

## 2022-10-30 DIAGNOSIS — J209 Acute bronchitis, unspecified: Secondary | ICD-10-CM | POA: Diagnosis not present

## 2022-10-30 DIAGNOSIS — J440 Chronic obstructive pulmonary disease with acute lower respiratory infection: Secondary | ICD-10-CM

## 2022-10-30 MED ORDER — AMOXICILLIN 875 MG PO TABS: 875.0000 mg | ORAL_TABLET | Freq: Two times a day (BID) | ORAL | 0 refills | Status: AC

## 2022-10-30 NOTE — Progress Notes (Signed)
[facility] [place], [place] [address]  Internal MEDICINE  Office Visit Note  Patient Name: Morgan Abbott  [id number]  [id number]  Date of Service: 10/30/2022  Chief Complaint  Patient presents with   Acute Visit     HPI Fantazia presents for an acute sick visit for symptoms of upper respiratory infection Symptoms still the same with no signiifcant improvement from telehealth visit on 10/21/22. She was almost done with zpak and prednisone.       Current Medication:  Outpatient Encounter Medications as of 10/30/2022  Medication Sig   amoxicillin (AMOXIL) 875 MG tablet Take 1 tablet (875 mg total) by mouth 2 (two) times daily for 10 days. Take with food   azithromycin (ZITHROMAX) 250 MG tablet Take one tab a day for 10 days for uri   benzonatate (TESSALON) 200 MG capsule Take 1 capsule (200 mg total) by mouth 2 (two) times daily as needed for cough.   betamethasone dipropionate 0.05 % cream Apply topically 2 (two) times daily.   chlorpheniramine-HYDROcodone (TUSSIONEX PENNKINETIC ER) 10-8 MG/5ML Take 5 mLs by mouth every 12 (twelve) hours as needed for cough.   clobetasol ointment (TEMOVATE) [id number] % Apply 1 Application topically 2 (two) times daily. To rash on legs until resolved.   dextromethorphan-guaiFENesin (MUCINEX DM) 30-600 MG 12hr tablet Take 1 tablet by mouth 2 (two) times daily.   diazepam (VALIUM) 5 MG tablet Take 1 tablet (5 mg total) by mouth every 12 (twelve) hours as needed for anxiety. Or sleep   ergocalciferol (VITAMIN D2) 1.25 MG (50000 UT) capsule Take 1 capsule (50,000 Units total) by mouth every 14 (fourteen) days.   meloxicam (MOBIC) 7.5 MG tablet Take 7.5 mg by mouth daily.   omeprazole (PRILOSEC) 40 MG capsule Take 1 capsule (40 mg total) by mouth daily.   oxyCODONE-acetaminophen (PERCOCET) 10-325 MG tablet Take by mouth. 1 TAB BY MOUTH EVERY 12 HRS X 10 DAYS, 1 TAB EVERY 8 HRS X 10 DAYS THEN 1 TAB EVERY 6 HRS X 10 DAYS   predniSONE  (DELTASONE) 10 MG tablet Take one tab 3 x day for 3 days, then take one tab 2 x a day for 3 days and then take one tab a day for 3 days for copd   albuterol (VENTOLIN HFA) 108 (90 Base) MCG/ACT inhaler Inhale 2 puffs into the lungs every 6 (six) hours as needed for up to 7 days for wheezing or shortness of breath.   Facility-Administered Encounter Medications as of 10/30/2022  Medication   cyanocobalamin ((VITAMIN B-12)) injection 1,000 mcg   cyanocobalamin ((VITAMIN B-12)) injection 1,000 mcg      Medical History: Past Medical History:  Diagnosis Date   Allergic rhinitis    Arthritis    Bronchitis    Hyperlipidemia    Migraines    RSV (acute bronchiolitis due to respiratory syncytial virus)      Vital Signs: BP 126/78   Pulse 89   Temp 98.3 F (36.8 C)   Resp 16   Ht 5' 3"$  (1.6 m)   Wt 136 lb 3.2 oz (61.8 kg)   SpO2 93%   BMI 24.13 kg/m    Review of Systems  Constitutional:  Positive for fatigue.  HENT:  Positive for congestion, postnasal drip, sore throat and trouble swallowing.   Respiratory:  Positive for cough, chest tightness, shortness of breath and wheezing.   Cardiovascular: Negative.  Negative for chest pain and palpitations.  Neurological:  Positive for headaches.  Physical Exam Vitals reviewed.  Cardiovascular:     Heart sounds: Normal heart sounds, S1 normal and S2 normal.  Pulmonary:     Effort: No accessory muscle usage or respiratory distress.     Breath sounds: Normal air entry. Examination of the right-middle field reveals decreased breath sounds. Examination of the left-middle field reveals decreased breath sounds. Examination of the right-lower field reveals wheezing and rales. Examination of the left-lower field reveals wheezing and rales. Decreased breath sounds, wheezing and rales present.       Assessment/Plan: 1. Acute bronchitis with COPD ([place]) antibiotic switching to amoxicillin  - amoxicillin (AMOXIL) 875 MG tablet; Take 1 tablet  (875 mg total) by mouth 2 (two) times daily for 10 days. Take with food  Dispense: 20 tablet; Refill: 0   General Counseling: Rhylee verbalizes understanding of the findings of todays visit and agrees with plan of treatment. I have discussed any further diagnostic evaluation that may be needed or ordered today. We also reviewed her medications today. she has been encouraged to call the office with any questions or concerns that should arise related to todays visit.    Counseling:    No orders of the defined types were placed in this encounter.   Meds ordered this encounter  Medications   amoxicillin (AMOXIL) 875 MG tablet    Sig: Take 1 tablet (875 mg total) by mouth 2 (two) times daily for 10 days. Take with food    Dispense:  20 tablet    Refill:  0    Return if symptoms worsen or fail to improve.  [place] Controlled Substance Database was reviewed by me for overdose risk score (ORS)  Time spent:20 Minutes Time spent with patient included reviewing progress notes, labs, imaging studies, and discussing plan for follow up.   This patient was seen by Jonetta Osgood, FNP-C in collaboration with Dr. Clayborn Bigness as a part of collaborative care agreement.  Paydon Carll R. Valetta Fuller, MSN, FNP-C Internal Medicine

## 2022-11-25 ENCOUNTER — Ambulatory Visit: Payer: Medicare Other | Admitting: Nurse Practitioner

## 2022-12-02 ENCOUNTER — Ambulatory Visit: Payer: Medicare Other | Admitting: Nurse Practitioner

## 2022-12-11 DIAGNOSIS — M47816 Spondylosis without myelopathy or radiculopathy, lumbar region: Secondary | ICD-10-CM | POA: Diagnosis not present

## 2022-12-11 DIAGNOSIS — M7551 Bursitis of right shoulder: Secondary | ICD-10-CM | POA: Diagnosis not present

## 2022-12-11 DIAGNOSIS — G894 Chronic pain syndrome: Secondary | ICD-10-CM | POA: Diagnosis not present

## 2022-12-11 DIAGNOSIS — M47812 Spondylosis without myelopathy or radiculopathy, cervical region: Secondary | ICD-10-CM | POA: Diagnosis not present

## 2022-12-17 DIAGNOSIS — Z0001 Encounter for general adult medical examination with abnormal findings: Secondary | ICD-10-CM | POA: Diagnosis not present

## 2022-12-17 DIAGNOSIS — E782 Mixed hyperlipidemia: Secondary | ICD-10-CM | POA: Diagnosis not present

## 2022-12-17 DIAGNOSIS — E538 Deficiency of other specified B group vitamins: Secondary | ICD-10-CM | POA: Diagnosis not present

## 2022-12-17 DIAGNOSIS — E559 Vitamin D deficiency, unspecified: Secondary | ICD-10-CM | POA: Diagnosis not present

## 2022-12-18 ENCOUNTER — Encounter: Payer: Self-pay | Admitting: Nurse Practitioner

## 2022-12-18 ENCOUNTER — Ambulatory Visit (INDEPENDENT_AMBULATORY_CARE_PROVIDER_SITE_OTHER): Payer: Medicare Other | Admitting: Nurse Practitioner

## 2022-12-18 VITALS — BP 117/62 | HR 82 | Temp 98.3°F | Resp 16 | Ht 63.0 in | Wt 142.6 lb

## 2022-12-18 DIAGNOSIS — F411 Generalized anxiety disorder: Secondary | ICD-10-CM | POA: Diagnosis not present

## 2022-12-18 DIAGNOSIS — R945 Abnormal results of liver function studies: Secondary | ICD-10-CM

## 2022-12-18 DIAGNOSIS — Z760 Encounter for issue of repeat prescription: Secondary | ICD-10-CM

## 2022-12-18 DIAGNOSIS — Z2911 Encounter for prophylactic immunotherapy for respiratory syncytial virus (RSV): Secondary | ICD-10-CM

## 2022-12-18 DIAGNOSIS — R053 Chronic cough: Secondary | ICD-10-CM | POA: Diagnosis not present

## 2022-12-18 DIAGNOSIS — E538 Deficiency of other specified B group vitamins: Secondary | ICD-10-CM | POA: Diagnosis not present

## 2022-12-18 LAB — CMP14+EGFR
ALT: 40 IU/L — ABNORMAL HIGH (ref 0–32)
AST: 45 IU/L — ABNORMAL HIGH (ref 0–40)
Albumin/Globulin Ratio: 1.6 (ref 1.2–2.2)
Albumin: 3.9 g/dL (ref 3.8–4.8)
Alkaline Phosphatase: 222 IU/L — ABNORMAL HIGH (ref 44–121)
BUN/Creatinine Ratio: 16 (ref 12–28)
BUN: 15 mg/dL (ref 8–27)
Bilirubin Total: 1.4 mg/dL — ABNORMAL HIGH (ref 0.0–1.2)
CO2: 21 mmol/L (ref 20–29)
Calcium: 9.1 mg/dL (ref 8.7–10.3)
Chloride: 110 mmol/L — ABNORMAL HIGH (ref 96–106)
Creatinine, Ser: 0.94 mg/dL (ref 0.57–1.00)
Globulin, Total: 2.5 g/dL (ref 1.5–4.5)
Glucose: 93 mg/dL (ref 70–99)
Potassium: 4.5 mmol/L (ref 3.5–5.2)
Sodium: 143 mmol/L (ref 134–144)
Total Protein: 6.4 g/dL (ref 6.0–8.5)
eGFR: 63 mL/min/{1.73_m2} (ref >59)

## 2022-12-18 LAB — VITAMIN D 25 HYDROXY (VIT D DEFICIENCY, FRACTURES): Vit D, 25-Hydroxy: 39.7 ng/mL (ref 30.0–100.0)

## 2022-12-18 LAB — CBC WITH DIFFERENTIAL/PLATELET
Basophils Absolute: 0.1 10*3/uL (ref 0.0–0.2)
Basos: 1 %
EOS (ABSOLUTE): 0.2 10*3/uL (ref 0.0–0.4)
Eos: 4 %
Hematocrit: 34.6 % (ref 34.0–46.6)
Hemoglobin: 11.4 g/dL (ref 11.1–15.9)
Immature Grans (Abs): 0 10*3/uL (ref 0.0–0.1)
Immature Granulocytes: 0 %
Lymphocytes Absolute: 1.5 10*3/uL (ref 0.7–3.1)
Lymphs: 31 %
MCH: 30.1 pg (ref 26.6–33.0)
MCHC: 32.9 g/dL (ref 31.5–35.7)
MCV: 91 fL (ref 79–97)
Monocytes Absolute: 0.5 10*3/uL (ref 0.1–0.9)
Monocytes: 10 %
Neutrophils Absolute: 2.6 10*3/uL (ref 1.4–7.0)
Neutrophils: 54 %
Platelets: 200 10*3/uL (ref 150–450)
RBC: 3.79 x10E6/uL (ref 3.77–5.28)
RDW: 13.1 % (ref 11.7–15.4)
WBC: 4.9 10*3/uL (ref 3.4–10.8)

## 2022-12-18 LAB — LIPID PANEL
Chol/HDL Ratio: 2.4 ratio (ref 0.0–4.4)
Cholesterol, Total: 148 mg/dL (ref 100–199)
HDL: 61 mg/dL (ref >39)
LDL Chol Calc (NIH): 75 mg/dL (ref 0–99)
Triglycerides: 60 mg/dL (ref 0–149)
VLDL Cholesterol Cal: 12 mg/dL (ref 5–40)

## 2022-12-18 LAB — B12 AND FOLATE PANEL
Folate: 4.5 ng/mL (ref >3.0)
Vitamin B-12: 395 pg/mL (ref 232–1245)

## 2022-12-18 MED ORDER — DIAZEPAM 5 MG PO TABS: 5.0000 mg | ORAL_TABLET | Freq: Two times a day (BID) | ORAL | 2 refills | Status: DC | PRN

## 2022-12-18 MED ORDER — AREXVY 120 MCG/0.5ML IM SUSR: 0.5000 mL | Freq: Once | INTRAMUSCULAR | 0 refills | Status: AC

## 2022-12-18 MED ORDER — CYANOCOBALAMIN 1000 MCG/ML IJ SOLN
1000.0000 ug | Freq: Once | INTRAMUSCULAR | Status: AC
  Administered 2022-12-18: 1000 ug via INTRAMUSCULAR

## 2022-12-18 NOTE — Progress Notes (Signed)
[facility] [address] [place], [place] [address]  Internal MEDICINE  Office Visit Note  Patient Name: Morgan Abbott  [id number]  [id number]  Date of Service: 12/18/2022  Chief Complaint  Patient presents with   Hyperlipidemia   Follow-up    HPI Mahasin presents for a follow-up visit for lab results  Reviewed lab results with patient: CBC, lipid, B12, folate, vitamin D are all normal Kidney function has returned to normal Bilirubin has been persistently elevated, alkaline phosphatase is high at 222, AST 45, ALT 40. Due Refills of valium Cough comes and goes but has been better than previously Due for RSV vaccine -- was previously hospitalized with RSV infection.     Current Medication: Outpatient Encounter Medications as of 12/18/2022  Medication Sig   azithromycin (ZITHROMAX) 250 MG tablet Take one tab a day for 10 days for uri   benzonatate (TESSALON) 200 MG capsule Take 1 capsule (200 mg total) by mouth 2 (two) times daily as needed for cough.   betamethasone dipropionate 0.05 % cream Apply topically 2 (two) times daily.   clobetasol ointment (TEMOVATE) 0.05 % Apply 1 Application topically 2 (two) times daily. To rash on legs until resolved.   dextromethorphan-guaiFENesin (MUCINEX DM) 30-600 MG 12hr tablet Take 1 tablet by mouth 2 (two) times daily.   ergocalciferol (VITAMIN D2) 1.25 MG (50000 UT) capsule Take 1 capsule (50,000 Units total) by mouth every 14 (fourteen) days.   meloxicam (MOBIC) 7.5 MG tablet Take 7.5 mg by mouth daily.   omeprazole (PRILOSEC) 40 MG capsule Take 1 capsule (40 mg total) by mouth daily.   oxyCODONE-acetaminophen (PERCOCET) 10-325 MG tablet Take by mouth. 1 TAB BY MOUTH EVERY 12 HRS X 10 DAYS, 1 TAB EVERY 8 HRS X 10 DAYS THEN 1 TAB EVERY 6 HRS X 10 DAYS   predniSONE (DELTASONE) 10 MG tablet Take one tab 3 x day for 3 days, then take one tab 2 x a day for 3 days and then take one tab a day for 3 days for copd   [EXPIRED] RSV vaccine  recomb adjuvanted (AREXVY) 120 MCG/0.5ML injection Inject 0.5 mLs into the muscle once for 1 dose.   [DISCONTINUED] chlorpheniramine-HYDROcodone (TUSSIONEX PENNKINETIC ER) 10-8 MG/5ML Take 5 mLs by mouth every 12 (twelve) hours as needed for cough.   [DISCONTINUED] diazepam (VALIUM) 5 MG tablet Take 1 tablet (5 mg total) by mouth every 12 (twelve) hours as needed for anxiety. Or sleep   albuterol (VENTOLIN HFA) 108 (90 Base) MCG/ACT inhaler Inhale 2 puffs into the lungs every 6 (six) hours as needed for up to 7 days for wheezing or shortness of breath.   diazepam (VALIUM) 5 MG tablet Take 1 tablet (5 mg total) by mouth every 12 (twelve) hours as needed for anxiety. Or sleep   Facility-Administered Encounter Medications as of 12/18/2022  Medication   cyanocobalamin ((VITAMIN B-12)) injection 1,000 mcg   cyanocobalamin ((VITAMIN B-12)) injection 1,000 mcg   [COMPLETED] cyanocobalamin (VITAMIN B12) injection 1,000 mcg    Surgical History: Past Surgical History:  Procedure Laterality Date   CHOLECYSTECTOMY  1996   COLON RESECTION     dilatation and curettage     TONSILLECTOMY Bilateral     Medical History: Past Medical History:  Diagnosis Date   Allergic rhinitis    Arthritis    Bronchitis    Hyperlipidemia    Migraines    RSV (acute bronchiolitis due to respiratory syncytial virus)     Family History: Family  History  Problem Relation Age of Onset   Hypertension Mother    Bladder Cancer Father        46 yrs old when passed away    Social History   Socioeconomic History   Marital status: Married    Spouse name: Not on file   Number of children: Not on file   Years of education: Not on file   Highest education level: Not on file  Occupational History   Not on file  Tobacco Use   Smoking status: Never   Smokeless tobacco: Never  Substance and Sexual Activity   Alcohol use: No   Drug use: No   Sexual activity: Not on file  Other Topics Concern   Not on file  Social  History Narrative   Not on file   Social Determinants of Health   Financial Resource Strain: Not on file  Food Insecurity: Not on file  Transportation Needs: Not on file  Physical Activity: Not on file  Stress: Not on file  Social Connections: Not on file  Intimate Partner Violence: Not on file      Review of Systems  Constitutional:  Positive for fatigue. Negative for chills and unexpected weight change.  HENT:  Negative for congestion, rhinorrhea, sneezing and sore throat.   Eyes:  Negative for redness.  Respiratory:  Positive for cough. Negative for chest tightness, shortness of breath and wheezing.   Cardiovascular:  Negative for chest pain and palpitations.  Gastrointestinal:  Negative for abdominal pain, constipation, diarrhea, nausea and vomiting.  Genitourinary:  Negative for dysuria and frequency.  Musculoskeletal:  Negative for arthralgias, back pain, joint swelling and neck pain.  Skin:  Negative for rash.  Neurological: Negative.  Negative for tremors and numbness.  Hematological:  Negative for adenopathy. Does not bruise/bleed easily.  Psychiatric/Behavioral:  Positive for sleep disturbance. Negative for behavioral problems (Depression), self-injury and suicidal ideas. The patient is nervous/anxious.     Vital Signs: BP 117/62   Pulse 82   Temp 98.3 F (36.8 C)   Resp 16   Ht  (1.6 m)   Wt 142 lb 9.6 oz (64.7 kg)   SpO2 95%   BMI 25.26 kg/m    Physical Exam Vitals reviewed.  Constitutional:      General: She is not in acute distress.    Appearance: Normal appearance. She is normal weight. She is not ill-appearing.  HENT:     Head: Normocephalic and atraumatic.  Eyes:     Pupils: Pupils are equal, round, and reactive to light.  Cardiovascular:     Rate and Rhythm: Normal rate and regular rhythm.  Pulmonary:     Effort: Pulmonary effort is normal. No respiratory distress.  Neurological:     Mental Status: She is alert and oriented to person,  place, and time.  Psychiatric:        Mood and Affect: Mood normal.        Behavior: Behavior normal.        Assessment/Plan: 1. Abnormal liver function RUQ ultrasound for further evaluation to rule out structural changes. Follow up to discuss results with patient. - US Abdomen Limited RUQ (LIVER/GB); Future  2. Chronic cough Seeing pulmonary, has been somewhat better per patient.   3. B12 deficiency B12 injection administered in office today  - cyanocobalamin (VITAMIN B12) injection 1,000 mcg  4. Need for RSV vaccination - RSV vaccine recomb adjuvanted (AREXVY) 120 MCG/0.5ML injection; Inject 0.5 mLs into the muscle once for 1  dose.  Dispense: 0.5 mL; Refill: 0  5. Generalized anxiety disorder Refills ordered, follow up in 3 months for additional refills, continue diazepam as prescribed.  - diazepam (VALIUM) 5 MG tablet; Take 1 tablet (5 mg total) by mouth every 12 (twelve) hours as needed for anxiety. Or sleep  Dispense: 60 tablet; Refill: 2   General Counseling: Lasaundra verbalizes understanding of the findings of todays visit and agrees with plan of treatment. I have discussed any further diagnostic evaluation that may be needed or ordered today. We also reviewed her medications today. she has been encouraged to call the office with any questions or concerns that should arise related to todays visit.    Orders Placed This Encounter  Procedures   US Abdomen Limited RUQ (LIVER/GB)    Meds ordered this encounter  Medications   diazepam (VALIUM) 5 MG tablet    Sig: Take 1 tablet (5 mg total) by mouth every 12 (twelve) hours as needed for anxiety. Or sleep    Dispense:  60 tablet    Refill:  2   RSV vaccine recomb adjuvanted (AREXVY) 120 MCG/0.5ML injection    Sig: Inject 0.5 mLs into the muscle once for 1 dose.    Dispense:  0.5 mL    Refill:  0   cyanocobalamin (VITAMIN B12) injection 1,000 mcg    Return in about 12 weeks (around 03/12/2023) for F/U, anxiety med  refill, Greysin Medlen PCP and also need f/u for [place] results. .   Total time spent:30 Minutes Time spent includes review of chart, medications, test results, and follow up plan with the patient.   [place] Controlled Substance Database was reviewed by me.  This patient was seen by Sallyanne Kuster, FNP-C in collaboration with Dr. Beverely Risen as a part of collaborative care agreement.   Joi Leyva R. Tedd Sias, MSN, FNP-C Internal medicine

## 2023-01-04 ENCOUNTER — Encounter: Payer: Self-pay | Admitting: Nurse Practitioner

## 2023-01-06 ENCOUNTER — Telehealth: Payer: Self-pay | Admitting: Nurse Practitioner

## 2023-01-06 NOTE — Telephone Encounter (Signed)
Spoke with pt she  like to make appt I made her appt

## 2023-01-07 ENCOUNTER — Ambulatory Visit (INDEPENDENT_AMBULATORY_CARE_PROVIDER_SITE_OTHER): Payer: Medicare Other | Admitting: Nurse Practitioner

## 2023-01-07 ENCOUNTER — Encounter: Payer: Self-pay | Admitting: Nurse Practitioner

## 2023-01-07 ENCOUNTER — Ambulatory Visit
Admission: RE | Admit: 2023-01-07 | Discharge: 2023-01-07 | Disposition: A | Discharge status: Home / Self Care | Payer: Medicare Other | Source: Ambulatory Visit | Attending: Nurse Practitioner | Admitting: Nurse Practitioner

## 2023-01-07 VITALS — BP 136/79 | HR 88 | Temp 98.0°F | Resp 16 | Ht 63.0 in | Wt 132.0 lb

## 2023-01-07 DIAGNOSIS — R945 Abnormal results of liver function studies: Secondary | ICD-10-CM | POA: Diagnosis not present

## 2023-01-07 DIAGNOSIS — K760 Fatty (change of) liver, not elsewhere classified: Secondary | ICD-10-CM | POA: Diagnosis not present

## 2023-01-07 DIAGNOSIS — Z9049 Acquired absence of other specified parts of digestive tract: Secondary | ICD-10-CM | POA: Diagnosis not present

## 2023-01-07 NOTE — Progress Notes (Signed)
[facility] [address] [place], [place] [address]  Internal MEDICINE  Office Visit Note  Patient Name: Morgan Abbott  [id number]  [id number]  Date of Service: 01/07/2023  Chief Complaint  Patient presents with   Follow-up    Labs f/u     HPI Sundae presents for a follow-up visit for elevated liver enzymes  Ultrasound scheduled for June -- needs to be earlier. Does not drink alcohol, weight and BMI are normal. Takes approx 50 mg total of oxycodone per day. And 1625 mg of acetaminophen and occasionally takes ibuprofen but not everyday.  BMI and weight are normal. She does not have hypertension or diabetes Lost 10 lbs in the past 20 days.     Current Medication: Outpatient Encounter Medications as of 01/07/2023  Medication Sig   benzonatate (TESSALON) 200 MG capsule Take 1 capsule (200 mg total) by mouth 2 (two) times daily as needed for cough.   betamethasone dipropionate 0.05 % cream Apply topically 2 (two) times daily.   clobetasol ointment (TEMOVATE) 0.05 % Apply 1 Application topically 2 (two) times daily. To rash on legs until resolved.   dextromethorphan-guaiFENesin (MUCINEX DM) 30-600 MG 12hr tablet Take 1 tablet by mouth 2 (two) times daily.   diazepam (VALIUM) 5 MG tablet Take 1 tablet (5 mg total) by mouth every 12 (twelve) hours as needed for anxiety. Or sleep   ergocalciferol (VITAMIN D2) 1.25 MG (50000 UT) capsule Take 1 capsule (50,000 Units total) by mouth every 14 (fourteen) days.   omeprazole (PRILOSEC) 40 MG capsule Take 1 capsule (40 mg total) by mouth daily.   oxyCODONE-acetaminophen (PERCOCET) 10-325 MG tablet Take by mouth. 1 TAB BY MOUTH EVERY 12 HRS X 10 DAYS, 1 TAB EVERY 8 HRS X 10 DAYS THEN 1 TAB EVERY 6 HRS X 10 DAYS   [DISCONTINUED] azithromycin (ZITHROMAX) 250 MG tablet Take one tab a day for 10 days for uri   [DISCONTINUED] meloxicam (MOBIC) 7.5 MG tablet Take 7.5 mg by mouth daily.   [DISCONTINUED] predniSONE (DELTASONE) 10 MG tablet Take  one tab 3 x day for 3 days, then take one tab 2 x a day for 3 days and then take one tab a day for 3 days for copd   albuterol (VENTOLIN HFA) 108 (90 Base) MCG/ACT inhaler Inhale 2 puffs into the lungs every 6 (six) hours as needed for up to 7 days for wheezing or shortness of breath.   [DISCONTINUED] cyanocobalamin ((VITAMIN B-12)) injection 1,000 mcg    [DISCONTINUED] cyanocobalamin ((VITAMIN B-12)) injection 1,000 mcg    No facility-administered encounter medications on file as of 01/07/2023.    Surgical History: Past Surgical History:  Procedure Laterality Date   CHOLECYSTECTOMY  1996   COLON RESECTION     dilatation and curettage     TONSILLECTOMY Bilateral     Medical History: Past Medical History:  Diagnosis Date   Allergic rhinitis    Arthritis    Bronchitis    Hyperlipidemia    Migraines    RSV (acute bronchiolitis due to respiratory syncytial virus)     Family History: Family History  Problem Relation Age of Onset   Hypertension Mother    Bladder Cancer Father        33 yrs old when passed away    Social History   Socioeconomic History   Marital status: Married    Spouse name: Not on file   Number of children: Not on file   Years of education: Not  on file   Highest education level: Not on file  Occupational History   Not on file  Tobacco Use   Smoking status: Never   Smokeless tobacco: Never  Substance and Sexual Activity   Alcohol use: No   Drug use: No   Sexual activity: Not on file  Other Topics Concern   Not on file  Social History Narrative   Not on file   Social Determinants of Health   Financial Resource Strain: Not on file  Food Insecurity: Not on file  Transportation Needs: Not on file  Physical Activity: Not on file  Stress: Not on file  Social Connections: Not on file  Intimate Partner Violence: Not on file      Review of Systems  Constitutional:  Positive for fatigue and unexpected weight change. Negative for chills.   HENT:  Negative for congestion, rhinorrhea, sneezing and sore throat.   Eyes:  Negative for redness.  Respiratory:  Positive for cough. Negative for chest tightness, shortness of breath and wheezing.   Cardiovascular:  Negative for chest pain and palpitations.  Gastrointestinal:  Negative for abdominal pain, constipation, diarrhea, nausea and vomiting.  Genitourinary:  Negative for dysuria and frequency.  Musculoskeletal:  Negative for arthralgias, back pain, joint swelling and neck pain.  Skin:  Negative for rash.  Neurological: Negative.  Negative for tremors and numbness.  Hematological:  Negative for adenopathy. Does not bruise/bleed easily.  Psychiatric/Behavioral:  Positive for sleep disturbance. Negative for behavioral problems (Depression), self-injury and suicidal ideas. The patient is nervous/anxious.     Vital Signs: BP 136/79   Pulse 88   Temp 98 F (36.7 C)   Resp 16   Ht  (1.6 m)   Wt 132 lb (59.9 kg)   SpO2 98%   BMI 23.38 kg/m    Physical Exam Vitals reviewed.  Constitutional:      General: She is not in acute distress.    Appearance: Normal appearance. She is normal weight. She is not ill-appearing.  HENT:     Head: Normocephalic and atraumatic.  Eyes:     Pupils: Pupils are equal, round, and reactive to light.  Cardiovascular:     Rate and Rhythm: Normal rate and regular rhythm.  Pulmonary:     Effort: Pulmonary effort is normal. No respiratory distress.  Neurological:     Mental Status: She is alert and oriented to person, place, and time.  Psychiatric:        Mood and Affect: Mood normal.        Behavior: Behavior normal.        Assessment/Plan: 1. Abnormal liver function Move up liver ultrasound to stat today. Follow up to discuss results.  - US Abdomen Limited RUQ (LIVER/GB); Future   General Counseling: annalynne ibanez understanding of the findings of todays visit and agrees with plan of treatment. I have discussed any further  diagnostic evaluation that may be needed or ordered today. We also reviewed her medications today. she has been encouraged to call the office with any questions or concerns that should arise related to todays visit.    Orders Placed This Encounter  Procedures   US Abdomen Limited RUQ (LIVER/GB)    No orders of the defined types were placed in this encounter.   Return for please get ultrasound scheduled sooner, and then need follow up to discuss results. .   Total time spent:30 Minutes Time spent includes review of chart, medications, test results, and follow up plan with  the patient.   [place] Controlled Substance Database was reviewed by me.  This patient was seen by Jonetta Osgood, FNP-C in collaboration with Dr. Clayborn Bigness as a part of collaborative care agreement.   Orli Degrave R. Valetta Fuller, MSN, FNP-C Internal medicine

## 2023-01-08 DIAGNOSIS — M7551 Bursitis of right shoulder: Secondary | ICD-10-CM | POA: Diagnosis not present

## 2023-01-08 DIAGNOSIS — M47816 Spondylosis without myelopathy or radiculopathy, lumbar region: Secondary | ICD-10-CM | POA: Diagnosis not present

## 2023-01-08 DIAGNOSIS — G894 Chronic pain syndrome: Secondary | ICD-10-CM | POA: Diagnosis not present

## 2023-01-08 DIAGNOSIS — M47812 Spondylosis without myelopathy or radiculopathy, cervical region: Secondary | ICD-10-CM | POA: Diagnosis not present

## 2023-02-04 DIAGNOSIS — M47816 Spondylosis without myelopathy or radiculopathy, lumbar region: Secondary | ICD-10-CM | POA: Diagnosis not present

## 2023-02-04 DIAGNOSIS — G894 Chronic pain syndrome: Secondary | ICD-10-CM | POA: Diagnosis not present

## 2023-02-04 DIAGNOSIS — M7551 Bursitis of right shoulder: Secondary | ICD-10-CM | POA: Diagnosis not present

## 2023-02-04 DIAGNOSIS — M47812 Spondylosis without myelopathy or radiculopathy, cervical region: Secondary | ICD-10-CM | POA: Diagnosis not present

## 2023-03-03 ENCOUNTER — Other Ambulatory Visit: Payer: Medicare Other

## 2023-03-05 ENCOUNTER — Other Ambulatory Visit: Payer: Self-pay | Admitting: Physical Medicine and Rehabilitation

## 2023-03-05 ENCOUNTER — Ambulatory Visit
Admission: RE | Admit: 2023-03-05 | Discharge: 2023-03-05 | Disposition: A | Discharge status: Home / Self Care | Payer: Medicare Other | Attending: Physical Medicine and Rehabilitation | Admitting: Physical Medicine and Rehabilitation

## 2023-03-05 ENCOUNTER — Ambulatory Visit
Admission: RE | Admit: 2023-03-05 | Discharge: 2023-03-05 | Disposition: A | Discharge status: Home / Self Care | Payer: Medicare Other | Source: Ambulatory Visit | Attending: Physical Medicine and Rehabilitation | Admitting: Physical Medicine and Rehabilitation

## 2023-03-05 DIAGNOSIS — M546 Pain in thoracic spine: Secondary | ICD-10-CM

## 2023-03-06 DIAGNOSIS — M47812 Spondylosis without myelopathy or radiculopathy, cervical region: Secondary | ICD-10-CM | POA: Diagnosis not present

## 2023-03-06 DIAGNOSIS — G894 Chronic pain syndrome: Secondary | ICD-10-CM | POA: Diagnosis not present

## 2023-03-06 DIAGNOSIS — M7551 Bursitis of right shoulder: Secondary | ICD-10-CM | POA: Diagnosis not present

## 2023-03-06 DIAGNOSIS — M170 Bilateral primary osteoarthritis of knee: Secondary | ICD-10-CM | POA: Diagnosis not present

## 2023-03-06 DIAGNOSIS — M47816 Spondylosis without myelopathy or radiculopathy, lumbar region: Secondary | ICD-10-CM | POA: Diagnosis not present

## 2023-03-10 ENCOUNTER — Ambulatory Visit: Payer: Medicare Other | Admitting: Nurse Practitioner

## 2023-03-12 ENCOUNTER — Encounter: Payer: Self-pay | Admitting: Nurse Practitioner

## 2023-03-12 ENCOUNTER — Ambulatory Visit (INDEPENDENT_AMBULATORY_CARE_PROVIDER_SITE_OTHER): Payer: Medicare Other | Admitting: Nurse Practitioner

## 2023-03-12 VITALS — BP 120/66 | HR 84 | Temp 97.9°F | Resp 16 | Ht 63.0 in | Wt 136.2 lb

## 2023-03-12 DIAGNOSIS — K219 Gastro-esophageal reflux disease without esophagitis: Secondary | ICD-10-CM

## 2023-03-12 DIAGNOSIS — K760 Fatty (change of) liver, not elsewhere classified: Secondary | ICD-10-CM | POA: Diagnosis not present

## 2023-03-12 DIAGNOSIS — E559 Vitamin D deficiency, unspecified: Secondary | ICD-10-CM

## 2023-03-12 DIAGNOSIS — Z760 Encounter for issue of repeat prescription: Secondary | ICD-10-CM

## 2023-03-12 DIAGNOSIS — E538 Deficiency of other specified B group vitamins: Secondary | ICD-10-CM

## 2023-03-12 DIAGNOSIS — F411 Generalized anxiety disorder: Secondary | ICD-10-CM

## 2023-03-12 MED ORDER — DIAZEPAM 5 MG PO TABS: 5.0000 mg | ORAL_TABLET | Freq: Two times a day (BID) | ORAL | 2 refills | Status: DC | PRN

## 2023-03-12 MED ORDER — OMEPRAZOLE 40 MG PO CPDR: 40.0000 mg | DELAYED_RELEASE_CAPSULE | Freq: Every day | ORAL | 6 refills | Status: DC

## 2023-03-12 MED ORDER — CYANOCOBALAMIN 1000 MCG/ML IJ SOLN
1000.0000 ug | Freq: Once | INTRAMUSCULAR | Status: AC
  Administered 2023-03-12: 1000 ug via INTRAMUSCULAR

## 2023-03-12 MED ORDER — ERGOCALCIFEROL 1.25 MG (50000 UT) PO CAPS: 50000.0000 [IU] | ORAL_CAPSULE | ORAL | 3 refills | Status: DC

## 2023-03-12 NOTE — Progress Notes (Signed)
[facility] [address] [place], [place] [address]  Internal MEDICINE  Office Visit Note  Patient Name: Morgan Abbott  [id number]  [id number]  Date of Service: 03/12/2023  Chief Complaint  Patient presents with   Hyperlipidemia   Follow-up    Review u/s    HPI Morgan Abbott presents for a follow-up visit for xray and ultrasound results.  Thoracic spine xray -- rightward convex scoliosis of midthoracic spin. Significant elevation of left hemidiaphragm  RUQ ultrasound shows fatty liver -- this is a new diagnosis for the patient, and was discussed in detail with her today.  Anxiety -- due for diazepam refill. Current dose is effective.  B12 level is slightly lower than previous lab, patient requesting B12 injection today.    Current Medication: Outpatient Encounter Medications as of 03/12/2023  Medication Sig   benzonatate (TESSALON) 200 MG capsule Take 1 capsule (200 mg total) by mouth 2 (two) times daily as needed for cough.   betamethasone dipropionate 0.05 % cream Apply topically 2 (two) times daily.   clobetasol ointment (TEMOVATE) 0.05 % Apply 1 Application topically 2 (two) times daily. To rash on legs until resolved.   dextromethorphan-guaiFENesin (MUCINEX DM) 30-600 MG 12hr tablet Take 1 tablet by mouth 2 (two) times daily.   oxyCODONE-acetaminophen (PERCOCET) 10-325 MG tablet Take by mouth. 1 TAB BY MOUTH EVERY 12 HRS X 10 DAYS, 1 TAB EVERY 8 HRS X 10 DAYS THEN 1 TAB EVERY 6 HRS X 10 DAYS   [DISCONTINUED] diazepam (VALIUM) 5 MG tablet Take 1 tablet (5 mg total) by mouth every 12 (twelve) hours as needed for anxiety. Or sleep   [DISCONTINUED] ergocalciferol (VITAMIN D2) 1.25 MG (50000 UT) capsule Take 1 capsule (50,000 Units total) by mouth every 14 (fourteen) days.   [DISCONTINUED] omeprazole (PRILOSEC) 40 MG capsule Take 1 capsule (40 mg total) by mouth daily.   albuterol (VENTOLIN HFA) 108 (90 Base) MCG/ACT inhaler Inhale 2 puffs into the lungs every 6 (six) hours as  needed for up to 7 days for wheezing or shortness of breath.   diazepam (VALIUM) 5 MG tablet Take 1 tablet (5 mg total) by mouth every 12 (twelve) hours as needed for anxiety. Or sleep   ergocalciferol (VITAMIN D2) 1.25 MG (50000 UT) capsule Take 1 capsule (50,000 Units total) by mouth every 14 (fourteen) days.   omeprazole (PRILOSEC) 40 MG capsule Take 1 capsule (40 mg total) by mouth daily.   Facility-Administered Encounter Medications as of 03/12/2023  Medication   cyanocobalamin (VITAMIN B12) injection 1,000 mcg    Surgical History: Past Surgical History:  Procedure Laterality Date   CHOLECYSTECTOMY  1996   COLON RESECTION     dilatation and curettage     TONSILLECTOMY Bilateral     Medical History: Past Medical History:  Diagnosis Date   Allergic rhinitis    Arthritis    Bronchitis    Hyperlipidemia    Migraines    RSV (acute bronchiolitis due to respiratory syncytial virus)     Family History: Family History  Problem Relation Age of Onset   Hypertension Mother    Bladder Cancer Father        69 yrs old when passed away    Social History   Socioeconomic History   Marital status: Married    Spouse name: Not on file   Number of children: Not on file   Years of education: Not on file   Highest education level: Not on file  Occupational History  Not on file  Tobacco Use   Smoking status: Never   Smokeless tobacco: Never  Substance and Sexual Activity   Alcohol use: No   Drug use: No   Sexual activity: Not on file  Other Topics Concern   Not on file  Social History Narrative   Not on file   Social Determinants of Health   Financial Resource Strain: Not on file  Food Insecurity: Not on file  Transportation Needs: Not on file  Physical Activity: Not on file  Stress: Not on file  Social Connections: Not on file  Intimate Partner Violence: Not on file      Review of Systems  Constitutional:  Positive for fatigue and unexpected weight change.  Negative for chills.  HENT:  Negative for congestion, rhinorrhea, sneezing and sore throat.   Eyes:  Negative for redness.  Respiratory:  Positive for cough. Negative for chest tightness, shortness of breath and wheezing.   Cardiovascular:  Negative for chest pain and palpitations.  Gastrointestinal:  Negative for abdominal pain, constipation, diarrhea, nausea and vomiting.  Genitourinary:  Negative for dysuria and frequency.  Musculoskeletal:  Negative for arthralgias, back pain, joint swelling and neck pain.  Skin:  Negative for rash.  Neurological: Negative.  Negative for tremors and numbness.  Hematological:  Negative for adenopathy. Does not bruise/bleed easily.  Psychiatric/Behavioral:  Positive for sleep disturbance. Negative for behavioral problems (Depression), self-injury and suicidal ideas. The patient is nervous/anxious.     Vital Signs: BP 120/66   Pulse 84   Temp 97.9 F (36.6 C)   Resp 16   Ht 5\' 3"  (1.6 m)   Wt 136 lb 3.2 oz (61.8 kg)   SpO2 95%   BMI 24.13 kg/m    Physical Exam Vitals reviewed.  Constitutional:      General: She is not in acute distress.    Appearance: Normal appearance. She is normal weight. She is not ill-appearing.  HENT:     Head: Normocephalic and atraumatic.  Eyes:     Pupils: Pupils are equal, round, and reactive to light.  Cardiovascular:     Rate and Rhythm: Normal rate and regular rhythm.  Pulmonary:     Effort: Pulmonary effort is normal. No respiratory distress.  Neurological:     Mental Status: She is alert and oriented to person, place, and time.  Psychiatric:        Mood and Affect: Mood normal.        Behavior: Behavior normal.        Assessment/Plan: 1. NAFL (nonalcoholic fatty liver) Discussed disease process, prognosis, treatment. Patient provided with information handouts and diet information for fatty liver.   2. B12 deficiency B12 injection administered in office today - cyanocobalamin (VITAMIN B12)  injection 1,000 mcg  3. Vitamin D deficiency Vitamin D level is low normal, continue weekly prescription vitamin D supplement as prescribed.  - ergocalciferol (VITAMIN D2) 1.25 MG (50000 UT) capsule; Take 1 capsule (50,000 Units total) by mouth every 14 (fourteen) days.  Dispense: 4 capsule; Refill: 3  4. Gastroesophageal reflux disease without esophagitis Stable, continue omeprazole as prescribed.  - omeprazole (PRILOSEC) 40 MG capsule; Take 1 capsule (40 mg total) by mouth daily.  Dispense: 30 capsule; Refill: 6  5. Generalized anxiety disorder Stable, continue diazepam as prescribed. Follow up in 3 months for additional refills.  - diazepam (VALIUM) 5 MG tablet; Take 1 tablet (5 mg total) by mouth every 12 (twelve) hours as needed for anxiety. Or sleep  Dispense: 60 tablet; Refill: 2   General Counseling: Morgan Abbott verbalizes understanding of the findings of todays visit and agrees with plan of treatment. I have discussed any further diagnostic evaluation that may be needed or ordered today. We also reviewed her medications today. she has been encouraged to call the office with any questions or concerns that should arise related to todays visit.    No orders of the defined types were placed in this encounter.   Meds ordered this encounter  Medications   diazepam (VALIUM) 5 MG tablet    Sig: Take 1 tablet (5 mg total) by mouth every 12 (twelve) hours as needed for anxiety. Or sleep    Dispense:  60 tablet    Refill:  2   ergocalciferol (VITAMIN D2) 1.25 MG (50000 UT) capsule    Sig: Take 1 capsule (50,000 Units total) by mouth every 14 (fourteen) days.    Dispense:  4 capsule    Refill:  3   cyanocobalamin (VITAMIN B12) injection 1,000 mcg   omeprazole (PRILOSEC) 40 MG capsule    Sig: Take 1 capsule (40 mg total) by mouth daily.    Dispense:  30 capsule    Refill:  6    Return in about 3 months (around 06/05/2023) for F/U, anxiety med refill, Dmoni Fortson PCP.   Total time spent:30  Minutes Time spent includes review of chart, medications, test results, and follow up plan with the patient.   [place] Controlled Substance Database was reviewed by me.  This patient was seen by Sallyanne Kuster, FNP-C in collaboration with Dr. Beverely Risen as a part of collaborative care agreement.   Jerl Munyan R. Tedd Sias, MSN, FNP-C Internal medicine

## 2023-03-13 ENCOUNTER — Encounter: Payer: Self-pay | Admitting: Nurse Practitioner

## 2023-05-01 DIAGNOSIS — M47816 Spondylosis without myelopathy or radiculopathy, lumbar region: Secondary | ICD-10-CM | POA: Diagnosis not present

## 2023-05-01 DIAGNOSIS — G894 Chronic pain syndrome: Secondary | ICD-10-CM | POA: Diagnosis not present

## 2023-05-01 DIAGNOSIS — M7551 Bursitis of right shoulder: Secondary | ICD-10-CM | POA: Diagnosis not present

## 2023-05-01 DIAGNOSIS — M170 Bilateral primary osteoarthritis of knee: Secondary | ICD-10-CM | POA: Diagnosis not present

## 2023-05-01 DIAGNOSIS — M47812 Spondylosis without myelopathy or radiculopathy, cervical region: Secondary | ICD-10-CM | POA: Diagnosis not present

## 2023-05-05 ENCOUNTER — Other Ambulatory Visit: Payer: Self-pay | Admitting: Physical Medicine and Rehabilitation

## 2023-05-05 ENCOUNTER — Ambulatory Visit
Admission: RE | Admit: 2023-05-05 | Discharge: 2023-05-05 | Disposition: A | Discharge status: Home / Self Care | Payer: Medicare Other | Attending: Physical Medicine and Rehabilitation | Admitting: Physical Medicine and Rehabilitation

## 2023-05-05 ENCOUNTER — Ambulatory Visit
Admission: RE | Admit: 2023-05-05 | Discharge: 2023-05-05 | Disposition: A | Discharge status: Home / Self Care | Payer: Medicare Other | Source: Ambulatory Visit | Attending: Physical Medicine and Rehabilitation | Admitting: Physical Medicine and Rehabilitation

## 2023-05-05 DIAGNOSIS — M25511 Pain in right shoulder: Secondary | ICD-10-CM | POA: Diagnosis not present

## 2023-05-05 DIAGNOSIS — M19011 Primary osteoarthritis, right shoulder: Secondary | ICD-10-CM | POA: Diagnosis not present

## 2023-05-07 ENCOUNTER — Encounter: Payer: Self-pay | Admitting: Nurse Practitioner

## 2023-05-07 ENCOUNTER — Ambulatory Visit (INDEPENDENT_AMBULATORY_CARE_PROVIDER_SITE_OTHER): Payer: Medicare Other | Admitting: Nurse Practitioner

## 2023-05-07 VITALS — BP 128/78 | HR 74 | Temp 98.6°F | Resp 16 | Ht 63.0 in | Wt 142.0 lb

## 2023-05-07 DIAGNOSIS — E538 Deficiency of other specified B group vitamins: Secondary | ICD-10-CM | POA: Diagnosis not present

## 2023-05-07 DIAGNOSIS — K760 Fatty (change of) liver, not elsewhere classified: Secondary | ICD-10-CM | POA: Diagnosis not present

## 2023-05-07 DIAGNOSIS — J986 Disorders of diaphragm: Secondary | ICD-10-CM

## 2023-05-07 MED ORDER — CYANOCOBALAMIN 1000 MCG/ML IJ SOLN
1000.0000 ug | Freq: Once | INTRAMUSCULAR | Status: AC
Start: 2023-05-07 — End: 2023-05-07
  Administered 2023-05-07: 1000 ug via INTRAMUSCULAR

## 2023-05-07 NOTE — Progress Notes (Signed)
[facility] [address] [place], [place] [address]  Internal MEDICINE  Office Visit Note  Patient Name: Morgan Abbott  [id number]  [id number]  Date of Service: 05/07/2023  Chief Complaint  Patient presents with   Follow-up    Need x-ray    HPI Lucciana presents for a follow-up visit for elevated left hemidiaphragm, low B12, and NAFLD.  Elevated left hemidiaphragm and diaphragmatic paralysis -- chest xray showed this issue for the past few years. Causing patient to have chronic cough. If symptomatic treatment is not effective, surgical treatment is the last resort and this is done by cardiothoracic surgery. Would benefit patient to meet with surgeon to hear about the procedures and what her options are even if she decides not to have surgery.  B12 deficiency -- requests B12 injection.  NAFLD -- due for repeat lab    Current Medication: Outpatient Encounter Medications as of 05/07/2023  Medication Sig   benzonatate (TESSALON) 200 MG capsule Take 1 capsule (200 mg total) by mouth 2 (two) times daily as needed for cough.   betamethasone dipropionate 0.05 % cream Apply topically 2 (two) times daily.   clobetasol ointment (TEMOVATE) 0.05 % Apply 1 Application topically 2 (two) times daily. To rash on legs until resolved.   dextromethorphan-guaiFENesin (MUCINEX DM) 30-600 MG 12hr tablet Take 1 tablet by mouth 2 (two) times daily.   diazepam (VALIUM) 5 MG tablet Take 1 tablet (5 mg total) by mouth every 12 (twelve) hours as needed for anxiety. Or sleep   ergocalciferol (VITAMIN D2) 1.25 MG (50000 UT) capsule Take 1 capsule (50,000 Units total) by mouth every 14 (fourteen) days.   omeprazole (PRILOSEC) 40 MG capsule Take 1 capsule (40 mg total) by mouth daily.   oxyCODONE-acetaminophen (PERCOCET) 10-325 MG tablet Take by mouth. 1 TAB BY MOUTH EVERY 12 HRS X 10 DAYS, 1 TAB EVERY 8 HRS X 10 DAYS THEN 1 TAB EVERY 6 HRS X 10 DAYS   albuterol (VENTOLIN HFA) 108 (90 Base) MCG/ACT inhaler  Inhale 2 puffs into the lungs every 6 (six) hours as needed for up to 7 days for wheezing or shortness of breath.   [EXPIRED] cyanocobalamin (VITAMIN B12) injection 1,000 mcg    No facility-administered encounter medications on file as of 05/07/2023.    Surgical History: Past Surgical History:  Procedure Laterality Date   CHOLECYSTECTOMY  1996   COLON RESECTION     dilatation and curettage     TONSILLECTOMY Bilateral     Medical History: Past Medical History:  Diagnosis Date   Allergic rhinitis    Arthritis    Bronchitis    Hyperlipidemia    Migraines    RSV (acute bronchiolitis due to respiratory syncytial virus)     Family History: Family History  Problem Relation Age of Onset   Hypertension Mother    Bladder Cancer Father        31 yrs old when passed away    Social History   Socioeconomic History   Marital status: Married    Spouse name: Not on file   Number of children: Not on file   Years of education: Not on file   Highest education level: Not on file  Occupational History   Not on file  Tobacco Use   Smoking status: Never   Smokeless tobacco: Never  Substance and Sexual Activity   Alcohol use: No   Drug use: No   Sexual activity: Not on file  Other Topics Concern  Not on file  Social History Narrative   Not on file   Social Determinants of Health   Financial Resource Strain: Not on file  Food Insecurity: Not on file  Transportation Needs: Not on file  Physical Activity: Not on file  Stress: Not on file  Social Connections: Not on file  Intimate Partner Violence: Not on file      Review of Systems  Constitutional:  Positive for fatigue and unexpected weight change. Negative for chills.  HENT:  Negative for congestion, rhinorrhea, sneezing and sore throat.   Eyes:  Negative for redness.  Respiratory:  Positive for cough. Negative for chest tightness, shortness of breath and wheezing.   Cardiovascular:  Negative for chest pain and  palpitations.  Gastrointestinal:  Negative for abdominal pain, constipation, diarrhea, nausea and vomiting.  Genitourinary:  Negative for dysuria and frequency.  Musculoskeletal:  Negative for arthralgias, back pain, joint swelling and neck pain.  Skin:  Negative for rash.  Neurological: Negative.  Negative for tremors and numbness.  Hematological:  Negative for adenopathy. Does not bruise/bleed easily.  Psychiatric/Behavioral:  Positive for sleep disturbance. Negative for behavioral problems (Depression), self-injury and suicidal ideas. The patient is nervous/anxious.     Vital Signs: BP 128/78   Pulse 74   Temp 98.6 F (37 C)   Resp 16   Ht 5\' 3"  (1.6 m)   Wt 142 lb (64.4 kg)   SpO2 96%   BMI 25.15 kg/m    Physical Exam Vitals reviewed.  Constitutional:      General: She is not in acute distress.    Appearance: Normal appearance. She is normal weight. She is not ill-appearing.  HENT:     Head: Normocephalic and atraumatic.  Eyes:     Pupils: Pupils are equal, round, and reactive to light.  Cardiovascular:     Rate and Rhythm: Normal rate and regular rhythm.  Pulmonary:     Effort: Pulmonary effort is normal. No respiratory distress.  Neurological:     Mental Status: She is alert and oriented to person, place, and time.  Psychiatric:        Mood and Affect: Mood normal.        Behavior: Behavior normal.        Assessment/Plan: 1. Elevated hemidiaphragm CT chest ordered and referred to CT surgery for further evaluation and treatment options  - CT Chest Wo Contrast; Future - Ambulatory referral to Cardiothoracic Surgery  2. Diaphragmatic paralysis CT chest ordered and referred to CT surgery for further evaluation and treatment options.  - CT Chest Wo Contrast; Future - Ambulatory referral to Cardiothoracic Surgery  3. B12 deficiency B12 injection administered in office today. - cyanocobalamin (VITAMIN B12) injection 1,000 mcg  4. NAFLD (nonalcoholic fatty  liver disease) Repeat hepatic function panel.  - Hepatic function panel   General Counseling: Letitia Neri understanding of the findings of todays visit and agrees with plan of treatment. I have discussed any further diagnostic evaluation that may be needed or ordered today. We also reviewed her medications today. she has been encouraged to call the office with any questions or concerns that should arise related to todays visit.    Orders Placed This Encounter  Procedures   CT Chest Wo Contrast   Hepatic function panel   Ambulatory referral to Cardiothoracic Surgery    Meds ordered this encounter  Medications   cyanocobalamin (VITAMIN B12) injection 1,000 mcg    Return for previously scheduled, F/U, Jalen Daluz PCP coming up this  fall..   Total time spent:30 Minutes Time spent includes review of chart, medications, test results, and follow up plan with the patient.   [place] Controlled Substance Database was reviewed by me.  This patient was seen by Sallyanne Kuster, FNP-C in collaboration with Dr. Beverely Risen as a part of collaborative care agreement.   Teruko Joswick R. Tedd Sias, MSN, FNP-C Internal medicine

## 2023-05-08 ENCOUNTER — Telehealth: Payer: Self-pay | Admitting: Nurse Practitioner

## 2023-05-08 NOTE — Telephone Encounter (Signed)
Notified patient of CT appointment date, arrival time, location-Morgan Abbott 

## 2023-05-13 ENCOUNTER — Ambulatory Visit: Payer: Medicare Other

## 2023-05-23 ENCOUNTER — Other Ambulatory Visit: Payer: Self-pay | Admitting: Nurse Practitioner

## 2023-05-23 DIAGNOSIS — J986 Disorders of diaphragm: Secondary | ICD-10-CM

## 2023-05-26 ENCOUNTER — Ambulatory Visit
Admission: RE | Admit: 2023-05-26 | Discharge: 2023-05-26 | Disposition: A | Discharge status: Home / Self Care | Payer: Medicare Other | Attending: Nurse Practitioner | Admitting: Nurse Practitioner

## 2023-05-26 ENCOUNTER — Ambulatory Visit
Admission: RE | Admit: 2023-05-26 | Discharge: 2023-05-26 | Disposition: A | Discharge status: Home / Self Care | Payer: Medicare Other | Source: Ambulatory Visit | Attending: Nurse Practitioner | Admitting: Nurse Practitioner

## 2023-05-26 DIAGNOSIS — J986 Disorders of diaphragm: Secondary | ICD-10-CM | POA: Diagnosis not present

## 2023-05-26 DIAGNOSIS — R9389 Abnormal findings on diagnostic imaging of other specified body structures: Secondary | ICD-10-CM | POA: Diagnosis not present

## 2023-05-26 DIAGNOSIS — J9811 Atelectasis: Secondary | ICD-10-CM | POA: Diagnosis not present

## 2023-05-26 DIAGNOSIS — K760 Fatty (change of) liver, not elsewhere classified: Secondary | ICD-10-CM | POA: Diagnosis not present

## 2023-05-27 LAB — HEPATIC FUNCTION PANEL
ALT: 9 IU/L (ref 0–32)
AST: 14 IU/L (ref 0–40)
Albumin: 4.5 g/dL (ref 3.8–4.8)
Alkaline Phosphatase: 112 IU/L (ref 44–121)
Bilirubin Total: 0.9 mg/dL (ref 0.0–1.2)
Bilirubin, Direct: 0.22 mg/dL (ref 0.00–0.40)
Total Protein: 6.9 g/dL (ref 6.0–8.5)

## 2023-05-29 DIAGNOSIS — M7551 Bursitis of right shoulder: Secondary | ICD-10-CM | POA: Diagnosis not present

## 2023-05-29 DIAGNOSIS — M47812 Spondylosis without myelopathy or radiculopathy, cervical region: Secondary | ICD-10-CM | POA: Diagnosis not present

## 2023-05-29 DIAGNOSIS — M47816 Spondylosis without myelopathy or radiculopathy, lumbar region: Secondary | ICD-10-CM | POA: Diagnosis not present

## 2023-05-29 DIAGNOSIS — G894 Chronic pain syndrome: Secondary | ICD-10-CM | POA: Diagnosis not present

## 2023-05-29 DIAGNOSIS — Z79891 Long term (current) use of opiate analgesic: Secondary | ICD-10-CM | POA: Diagnosis not present

## 2023-06-01 ENCOUNTER — Encounter: Payer: Self-pay | Admitting: Nurse Practitioner

## 2023-06-02 NOTE — Progress Notes (Signed)
Hepatic function panel has returned to normal. Patient has upcoming office visit, will discuss then

## 2023-06-02 NOTE — Progress Notes (Signed)
Slightly increased elevated of the left hemidiaphragm. No acute findings and no interventions at this time. This xray will be available for CT surgery to review for patient's consultation.

## 2023-06-12 ENCOUNTER — Ambulatory Visit: Payer: Medicare Other | Admitting: Nurse Practitioner

## 2023-06-17 ENCOUNTER — Ambulatory Visit: Payer: Medicare Other | Admitting: Nurse Practitioner

## 2023-06-23 ENCOUNTER — Ambulatory Visit (INDEPENDENT_AMBULATORY_CARE_PROVIDER_SITE_OTHER): Payer: Medicare Other | Admitting: Nurse Practitioner

## 2023-06-23 ENCOUNTER — Encounter: Payer: Self-pay | Admitting: Nurse Practitioner

## 2023-06-23 VITALS — BP 116/74 | HR 82 | Temp 98.6°F | Resp 16 | Ht 63.0 in | Wt 136.4 lb

## 2023-06-23 DIAGNOSIS — K760 Fatty (change of) liver, not elsewhere classified: Secondary | ICD-10-CM | POA: Diagnosis not present

## 2023-06-23 DIAGNOSIS — E538 Deficiency of other specified B group vitamins: Secondary | ICD-10-CM | POA: Diagnosis not present

## 2023-06-23 DIAGNOSIS — J986 Disorders of diaphragm: Secondary | ICD-10-CM

## 2023-06-23 DIAGNOSIS — J0110 Acute frontal sinusitis, unspecified: Secondary | ICD-10-CM | POA: Diagnosis not present

## 2023-06-23 DIAGNOSIS — F411 Generalized anxiety disorder: Secondary | ICD-10-CM

## 2023-06-23 MED ORDER — AMOXICILLIN 875 MG PO TABS
875.0000 mg | ORAL_TABLET | Freq: Two times a day (BID) | ORAL | 0 refills | Status: AC
Start: 2023-06-23 — End: 2023-07-03

## 2023-06-23 MED ORDER — DIAZEPAM 5 MG PO TABS
5.0000 mg | ORAL_TABLET | Freq: Two times a day (BID) | ORAL | 2 refills | Status: DC | PRN
Start: 2023-06-23 — End: 2023-09-11

## 2023-06-23 NOTE — Progress Notes (Signed)
[facility] [address] [place], [place] [address]  Internal MEDICINE  Office Visit Note  Patient Name: Morgan Abbott  [id number]  [id number]  Date of Service: 06/23/2023  Chief Complaint  Patient presents with   Hyperlipidemia   Follow-up    HPI Natosha presents for a follow-up visit for chest xray, labs, b12, sleep and sinus infection.  Chest xray: Slightly increased elevation of the left hemidiaphragm, with associated atelectasis. Liver function panel is normal, bilirubin, alk phos, AST and ALT returned to normal range.  B12 deficiency -- request B12 injection today  Sleep -- valium refills  Runny nose, raspy voice, usually goes into bronchitis -- asking for antibiotic.    Current Medication: Outpatient Encounter Medications as of 06/23/2023  Medication Sig   amoxicillin (AMOXIL) 875 MG tablet Take 1 tablet (875 mg total) by mouth 2 (two) times daily for 10 days. Take with food   benzonatate (TESSALON) 200 MG capsule Take 1 capsule (200 mg total) by mouth 2 (two) times daily as needed for cough.   betamethasone dipropionate 0.05 % cream Apply topically 2 (two) times daily.   clobetasol ointment (TEMOVATE) 0.05 % Apply 1 Application topically 2 (two) times daily. To rash on legs until resolved.   dextromethorphan-guaiFENesin (MUCINEX DM) 30-600 MG 12hr tablet Take 1 tablet by mouth 2 (two) times daily.   ergocalciferol (VITAMIN D2) 1.25 MG (50000 UT) capsule Take 1 capsule (50,000 Units total) by mouth every 14 (fourteen) days.   omeprazole (PRILOSEC) 40 MG capsule Take 1 capsule (40 mg total) by mouth daily.   oxyCODONE-acetaminophen (PERCOCET) 10-325 MG tablet Take by mouth. 1 TAB BY MOUTH EVERY 12 HRS X 10 DAYS, 1 TAB EVERY 8 HRS X 10 DAYS THEN 1 TAB EVERY 6 HRS X 10 DAYS   [DISCONTINUED] diazepam (VALIUM) 5 MG tablet Take 1 tablet (5 mg total) by mouth every 12 (twelve) hours as needed for anxiety. Or sleep   albuterol (VENTOLIN HFA) 108 (90 Base) MCG/ACT inhaler  Inhale 2 puffs into the lungs every 6 (six) hours as needed for up to 7 days for wheezing or shortness of breath.   diazepam (VALIUM) 5 MG tablet Take 1 tablet (5 mg total) by mouth every 12 (twelve) hours as needed for anxiety. Or sleep   No facility-administered encounter medications on file as of 06/23/2023.    Surgical History: Past Surgical History:  Procedure Laterality Date   CHOLECYSTECTOMY  1996   COLON RESECTION     dilatation and curettage     TONSILLECTOMY Bilateral     Medical History: Past Medical History:  Diagnosis Date   Allergic rhinitis    Arthritis    Bronchitis    Hyperlipidemia    Migraines    RSV (acute bronchiolitis due to respiratory syncytial virus)     Family History: Family History  Problem Relation Age of Onset   Hypertension Mother    Bladder Cancer Father        75 yrs old when passed away    Social History   Socioeconomic History   Marital status: Married    Spouse name: Not on file   Number of children: Not on file   Years of education: Not on file   Highest education level: Not on file  Occupational History   Not on file  Tobacco Use   Smoking status: Never   Smokeless tobacco: Never  Substance and Sexual Activity   Alcohol use: No   Drug use: No  Sexual activity: Not on file  Other Topics Concern   Not on file  Social History Narrative   Not on file   Social Determinants of Health   Financial Resource Strain: Not on file  Food Insecurity: Not on file  Transportation Needs: Not on file  Physical Activity: Not on file  Stress: Not on file  Social Connections: Not on file  Intimate Partner Violence: Not on file      Review of Systems  Constitutional:  Positive for fatigue and unexpected weight change. Negative for chills.  HENT:  Negative for congestion, rhinorrhea, sneezing and sore throat.   Eyes:  Negative for redness.  Respiratory:  Positive for cough. Negative for chest tightness, shortness of breath and  wheezing.   Cardiovascular:  Negative for chest pain and palpitations.  Gastrointestinal:  Negative for abdominal pain, constipation, diarrhea, nausea and vomiting.  Genitourinary:  Negative for dysuria and frequency.  Musculoskeletal:  Negative for arthralgias, back pain, joint swelling and neck pain.  Skin:  Negative for rash.  Neurological: Negative.  Negative for tremors and numbness.  Hematological:  Negative for adenopathy. Does not bruise/bleed easily.  Psychiatric/Behavioral:  Positive for sleep disturbance. Negative for behavioral problems (Depression), self-injury and suicidal ideas. The patient is nervous/anxious.     Vital Signs: BP 116/74   Pulse 82   Temp 98.6 F (37 C)   Resp 16   Ht 5\' 3"  (1.6 m)   Wt 136 lb 6.4 oz (61.9 kg)   SpO2 97%   BMI 24.16 kg/m    Physical Exam Vitals reviewed.  Constitutional:      General: She is not in acute distress.    Appearance: Normal appearance. She is normal weight. She is not ill-appearing.  HENT:     Head: Normocephalic and atraumatic.  Eyes:     Pupils: Pupils are equal, round, and reactive to light.  Cardiovascular:     Rate and Rhythm: Normal rate and regular rhythm.  Pulmonary:     Effort: Pulmonary effort is normal. No respiratory distress.  Neurological:     Mental Status: She is alert and oriented to person, place, and time.  Psychiatric:        Mood and Affect: Mood normal.        Behavior: Behavior normal.        Assessment/Plan: 1. Acute non-recurrent frontal sinusitis Amoxicillin prescribed. Complete course until gone. - amoxicillin (AMOXIL) 875 MG tablet; Take 1 tablet (875 mg total) by mouth 2 (two) times daily for 10 days. Take with food  Dispense: 20 tablet; Refill: 0  2. NAFLD (nonalcoholic fatty liver disease) Discussed diet and lifestyle modifications including diet changes, avoiding alcohol use, and increasing physical activity.   3. Elevated hemidiaphragm Xray showed a slightly worsened  elevation. Declined cardiothoracic surgery consult. Patient states she is not likely to choose to have surgery done unless symptoms are unbearable and there are no other options. Patient will reach out if she changes her mind.   4. B12 deficiency B12 level is low normal, take 1000 mcg daily of OTC oral B12   5. Generalized anxiety disorder Continue valium as prescribed. Follow up in 3 months for additional refills, UDS due at next office visit.  - diazepam (VALIUM) 5 MG tablet; Take 1 tablet (5 mg total) by mouth every 12 (twelve) hours as needed for anxiety. Or sleep  Dispense: 60 tablet; Refill: 2   General Counseling: Letitia Neri understanding of the findings of todays visit and agrees  with plan of treatment. I have discussed any further diagnostic evaluation that may be needed or ordered today. We also reviewed her medications today. she has been encouraged to call the office with any questions or concerns that should arise related to todays visit.    No orders of the defined types were placed in this encounter.   Meds ordered this encounter  Medications   diazepam (VALIUM) 5 MG tablet    Sig: Take 1 tablet (5 mg total) by mouth every 12 (twelve) hours as needed for anxiety. Or sleep    Dispense:  60 tablet    Refill:  2    For future refill   amoxicillin (AMOXIL) 875 MG tablet    Sig: Take 1 tablet (875 mg total) by mouth 2 (two) times daily for 10 days. Take with food    Dispense:  20 tablet    Refill:  0    Return in about 3 months (around 09/18/2023) for F/U, anxiety med refill, Jorey Dollard PCP.   Total time spent:30 Minutes Time spent includes review of chart, medications, test results, and follow up plan with the patient.   [place] Controlled Substance Database was reviewed by me.  This patient was seen by Sallyanne Kuster, FNP-C in collaboration with Dr. Beverely Risen as a part of collaborative care agreement.   Montasia Chisenhall R. Tedd Sias, MSN, FNP-C Internal medicine

## 2023-07-23 DIAGNOSIS — M47816 Spondylosis without myelopathy or radiculopathy, lumbar region: Secondary | ICD-10-CM | POA: Diagnosis not present

## 2023-07-23 DIAGNOSIS — M47812 Spondylosis without myelopathy or radiculopathy, cervical region: Secondary | ICD-10-CM | POA: Diagnosis not present

## 2023-07-23 DIAGNOSIS — M7551 Bursitis of right shoulder: Secondary | ICD-10-CM | POA: Diagnosis not present

## 2023-07-23 DIAGNOSIS — G894 Chronic pain syndrome: Secondary | ICD-10-CM | POA: Diagnosis not present

## 2023-08-09 ENCOUNTER — Encounter: Payer: Self-pay | Admitting: Nurse Practitioner

## 2023-08-09 DIAGNOSIS — K760 Fatty (change of) liver, not elsewhere classified: Secondary | ICD-10-CM | POA: Insufficient documentation

## 2023-08-09 DIAGNOSIS — J986 Disorders of diaphragm: Secondary | ICD-10-CM | POA: Insufficient documentation

## 2023-08-09 IMAGING — CR DG CHEST 2V
1 series · 2 of 2 positions shown · non-contrast
Comparison: Chest radiograph and chest CT, 06/22/2021.

CLINICAL DATA: chronic cough since June 2021, History of
respiratory syncytial virus (RSV) infection, Chronic cough since
June 2021. Pt had RSV in June 2021 and COVID July 2021.

EXAM:
CHEST - 2 VIEW

[Series 1: dg chest 2 view · 0.14mm/px · 2 of 2 slices shown]
[im 1/2]
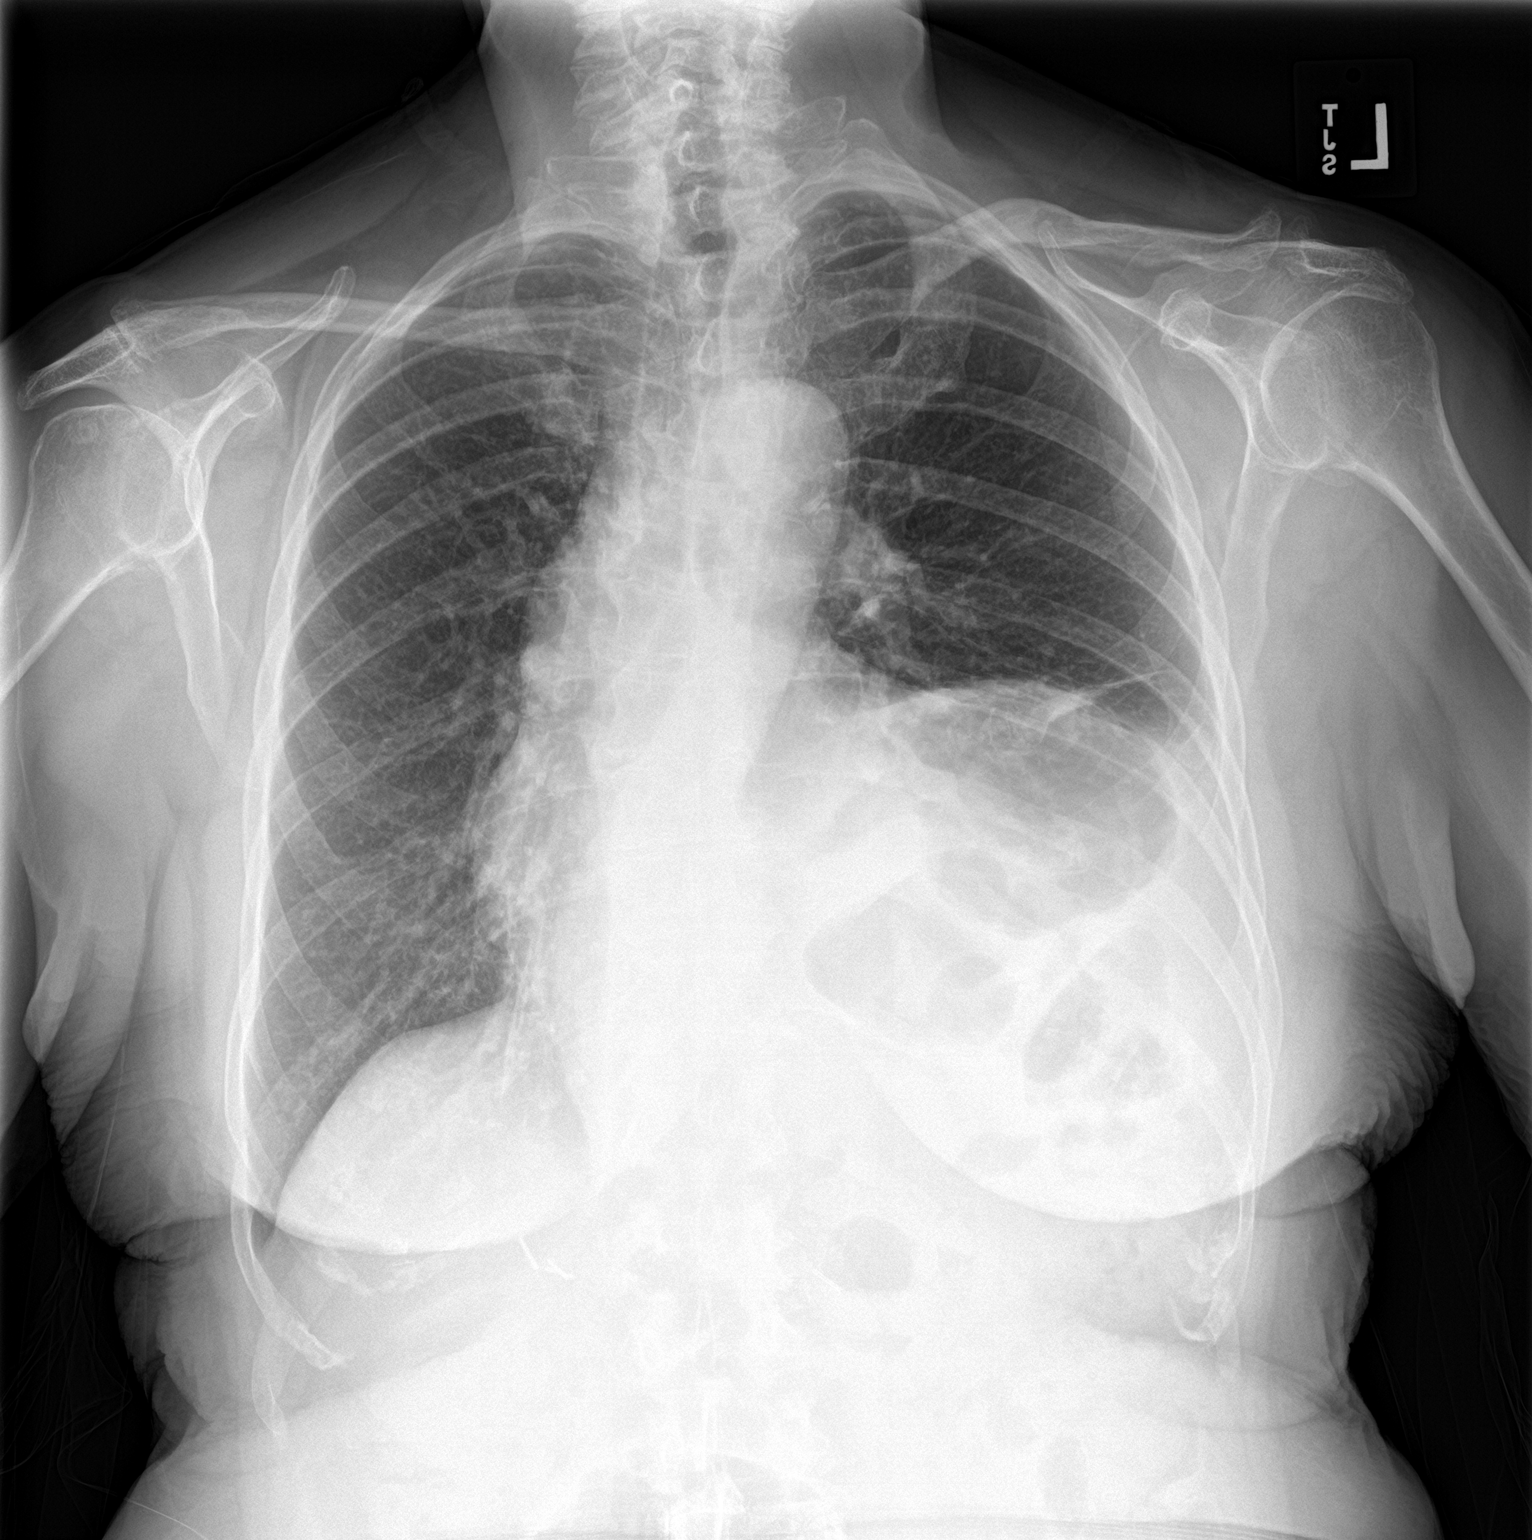
[im 2/2]
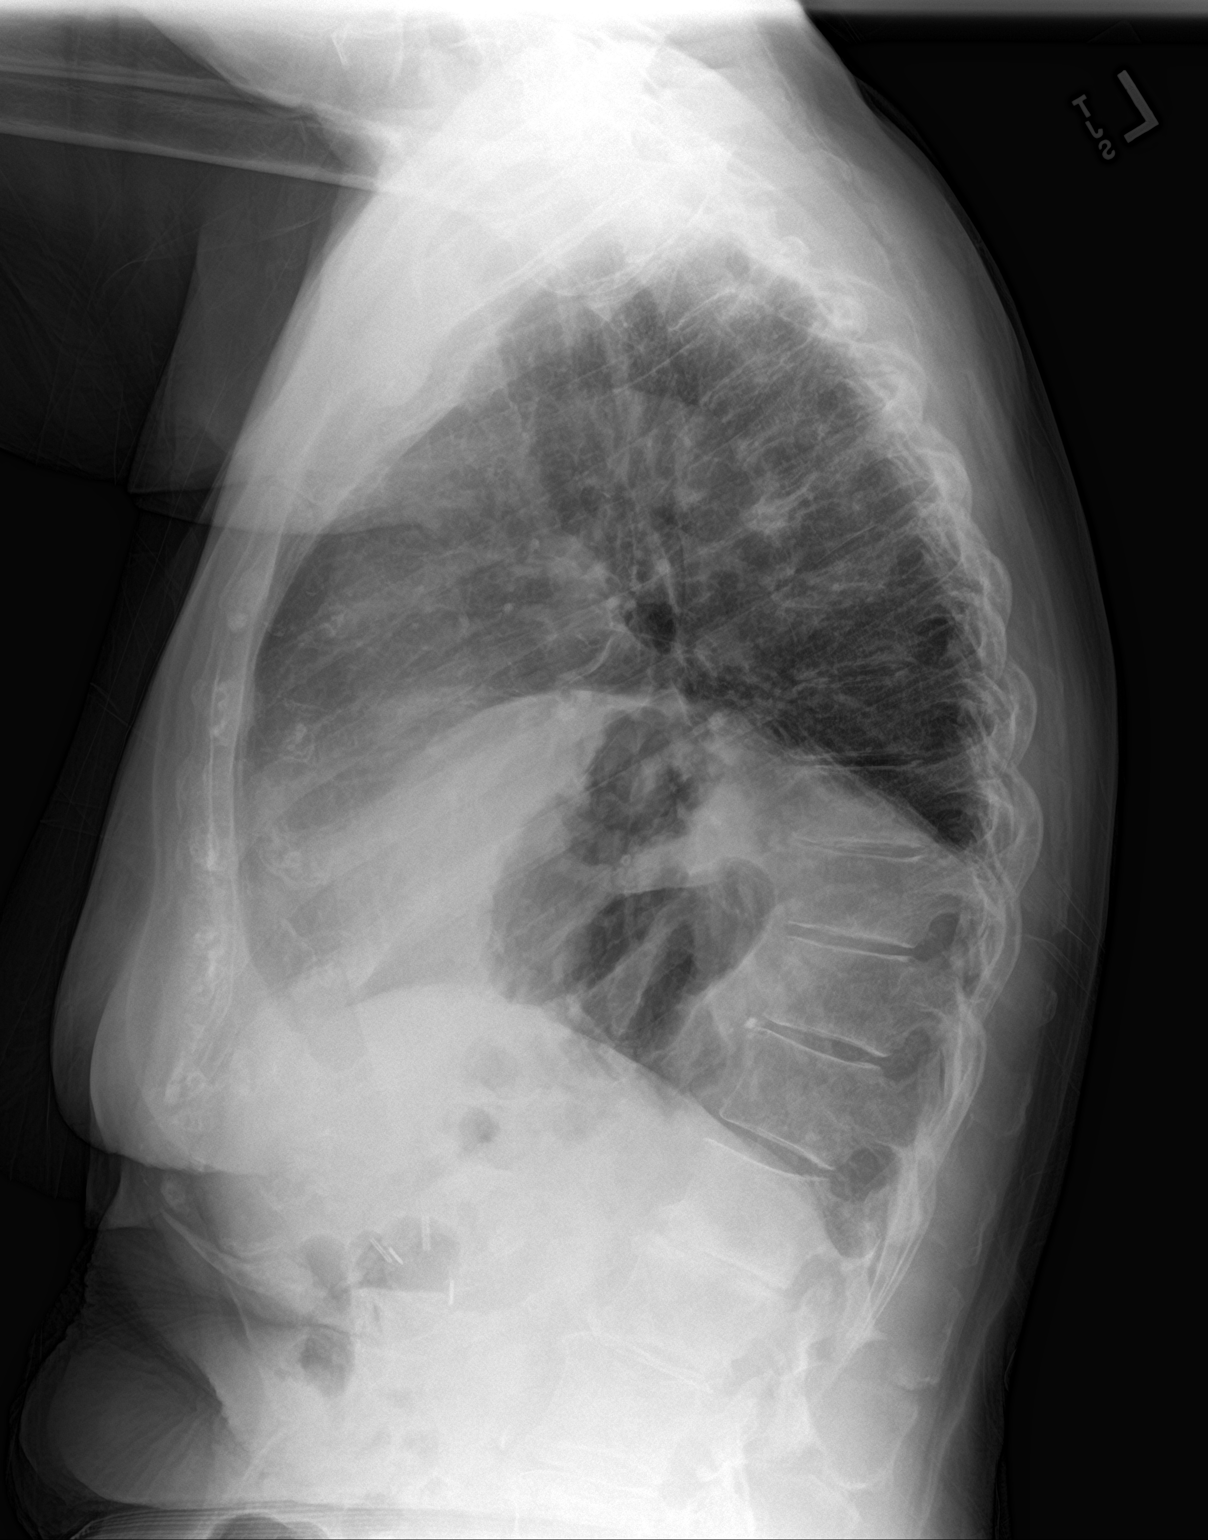

[2 of 2 positions shown; findings below may reference images not displayed]

FINDINGS: Cardiac silhouette normal in size. No mediastinal or hilar masses.
No evidence of adenopathy.

Significant elevation of left hemidiaphragm stable.

Mild linear atelectasis at the left lung base. Lungs are otherwise
clear.

No pleural effusion or pneumothorax.

Skeletal structures are intact.
IMPRESSION: No active cardiopulmonary disease.

## 2023-08-20 DIAGNOSIS — H524 Presbyopia: Secondary | ICD-10-CM | POA: Diagnosis not present

## 2023-09-03 ENCOUNTER — Telehealth: Payer: Self-pay

## 2023-09-03 NOTE — Telephone Encounter (Signed)
Lvm and sent MyChart msg to confirm 09/11/23 appointment. -K

## 2023-09-09 ENCOUNTER — Other Ambulatory Visit: Payer: Self-pay | Admitting: Nurse Practitioner

## 2023-09-09 DIAGNOSIS — K219 Gastro-esophageal reflux disease without esophagitis: Secondary | ICD-10-CM

## 2023-09-11 ENCOUNTER — Encounter: Payer: Self-pay | Admitting: Nurse Practitioner

## 2023-09-11 ENCOUNTER — Ambulatory Visit: Payer: Medicare Other | Admitting: Nurse Practitioner

## 2023-09-11 VITALS — BP 130/80 | HR 80 | Temp 98.3°F | Resp 16 | Ht 63.0 in | Wt 133.4 lb

## 2023-09-11 DIAGNOSIS — K760 Fatty (change of) liver, not elsewhere classified: Secondary | ICD-10-CM | POA: Diagnosis not present

## 2023-09-11 DIAGNOSIS — E2839 Other primary ovarian failure: Secondary | ICD-10-CM | POA: Diagnosis not present

## 2023-09-11 DIAGNOSIS — K047 Periapical abscess without sinus: Secondary | ICD-10-CM | POA: Diagnosis not present

## 2023-09-11 DIAGNOSIS — F411 Generalized anxiety disorder: Secondary | ICD-10-CM

## 2023-09-11 DIAGNOSIS — Z0000 Encounter for general adult medical examination without abnormal findings: Secondary | ICD-10-CM | POA: Diagnosis not present

## 2023-09-11 DIAGNOSIS — J986 Disorders of diaphragm: Secondary | ICD-10-CM

## 2023-09-11 DIAGNOSIS — K219 Gastro-esophageal reflux disease without esophagitis: Secondary | ICD-10-CM

## 2023-09-11 MED ORDER — OMEPRAZOLE 40 MG PO CPDR: 40.0000 mg | DELAYED_RELEASE_CAPSULE | Freq: Every day | ORAL | 6 refills | Status: DC

## 2023-09-11 MED ORDER — DIAZEPAM 5 MG PO TABS
5.0000 mg | ORAL_TABLET | Freq: Two times a day (BID) | ORAL | 2 refills | Status: DC | PRN
Start: 2023-09-11 — End: 2023-12-10

## 2023-09-11 MED ORDER — AMOXICILLIN 875 MG PO TABS: 875.0000 mg | ORAL_TABLET | Freq: Two times a day (BID) | ORAL | 0 refills | Status: AC

## 2023-09-11 NOTE — Progress Notes (Cosign Needed)
[facility] [address] [place], [place] [address]  Internal MEDICINE  Office Visit Note  Patient Name: Morgan Abbott  [id number]  [id number]  Date of Service: 09/11/2023  Chief Complaint  Patient presents with   Medicare Wellness   Quality Metric Gaps    BMD '    HPI Ashleyrose presents for an annual well visit and physical exam.  Well-appearing 77 y.o. female with asthma, GERD, low B12, low vitamin D, GAD, chronic pain, and depression, NAFLD, elevated hemidiaphragm, chronic cough.  Routine CRC screening: declined  Routine mammogram: declined  DEXA scan: due now  Labs: due for labs in April 2025  New or worsening pain: none, just typical arthritis  Other concerns: none  Goes to pain clinic for chronic pain management Sees an eye doctor regularly.      09/11/2023    9:08 AM 09/04/2022    9:12 AM 08/30/2021    2:29 PM  MMSE - Mini Mental State Exam  Orientation to time 5 5 5   Orientation to Place 5 5 5   Registration 3 3 3   Attention/ Calculation 5 5 5   Recall 3 3 3   Language- name 2 objects 2 2 2   Language- repeat 1 1 1   Language- follow 3 step command 3 3 3   Language- read & follow direction 1 1 1   Write a sentence 1 1 1   Copy design 1 1 1   Total score 30 30 30     Functional Status Survey: Is the patient deaf or have difficulty hearing?: No Does the patient have difficulty seeing, even when wearing glasses/contacts?: No Does the patient have difficulty concentrating, remembering, or making decisions?: No Does the patient have difficulty walking or climbing stairs?: No Does the patient have difficulty dressing or bathing?: No Does the patient have difficulty doing errands alone such as visiting a doctor's office or shopping?: No     08/30/2021    2:28 PM 11/29/2021    3:48 PM 09/04/2022    9:10 AM 01/07/2023    8:28 AM 09/11/2023    9:07 AM  Fall Risk  Falls in the past year? 0 0 0 0 0  Was there an injury with Fall?   0 0   Fall Risk Category  Calculator   0 0   Fall Risk Category (Retired)   Low    (RETIRED) Patient Fall Risk Level Low fall risk Low fall risk Low fall risk    Patient at Risk for Falls Due to No Fall Risks No Fall Risks No Fall Risks No Fall Risks   Fall risk Follow up Falls evaluation completed Falls evaluation completed Falls evaluation completed Falls evaluation completed        09/11/2023    9:07 AM  Depression screen PHQ 2/9  Decreased Interest 0  Down, Depressed, Hopeless 0  PHQ - 2 Score 0      Current Medication: Outpatient Encounter Medications as of 09/11/2023  Medication Sig   amoxicillin (AMOXIL) 875 MG tablet Take 1 tablet (875 mg total) by mouth 2 (two) times daily for 10 days.   benzonatate (TESSALON) 200 MG capsule Take 1 capsule (200 mg total) by mouth 2 (two) times daily as needed for cough.   betamethasone dipropionate 0.05 % cream Apply topically 2 (two) times daily.   clobetasol ointment (TEMOVATE) 0.05 % Apply 1 Application topically 2 (two) times daily. To rash on legs until resolved.   dextromethorphan-guaiFENesin (MUCINEX DM) 30-600 MG 12hr tablet Take 1 tablet by  mouth 2 (two) times daily.   ergocalciferol (VITAMIN D2) 1.25 MG (50000 UT) capsule Take 1 capsule (50,000 Units total) by mouth every 14 (fourteen) days.   oxyCODONE-acetaminophen (PERCOCET) 10-325 MG tablet Take by mouth. 1 TAB BY MOUTH EVERY 12 HRS X 10 DAYS, 1 TAB EVERY 8 HRS X 10 DAYS THEN 1 TAB EVERY 6 HRS X 10 DAYS   [DISCONTINUED] diazepam (VALIUM) 5 MG tablet Take 1 tablet (5 mg total) by mouth every 12 (twelve) hours as needed for anxiety. Or sleep   [DISCONTINUED] omeprazole (PRILOSEC) 40 MG capsule TAKE 1 CAPSULE (40 MG TOTAL) BY MOUTH DAILY.   albuterol (VENTOLIN HFA) 108 (90 Base) MCG/ACT inhaler Inhale 2 puffs into the lungs every 6 (six) hours as needed for up to 7 days for wheezing or shortness of breath.   diazepam (VALIUM) 5 MG tablet Take 1 tablet (5 mg total) by mouth every 12 (twelve) hours as needed  for anxiety. Or sleep   omeprazole (PRILOSEC) 40 MG capsule Take 1 capsule (40 mg total) by mouth daily.   No facility-administered encounter medications on file as of 09/11/2023.    Surgical History: Past Surgical History:  Procedure Laterality Date   CHOLECYSTECTOMY  1996   COLON RESECTION     dilatation and curettage     TONSILLECTOMY Bilateral     Medical History: Past Medical History:  Diagnosis Date   Allergic rhinitis    Arthritis    Bronchitis    Hyperlipidemia    Migraines    RSV (acute bronchiolitis due to respiratory syncytial virus)     Family History: Family History  Problem Relation Age of Onset   Hypertension Mother    Bladder Cancer Father        67 yrs old when passed away    Social History   Socioeconomic History   Marital status: Married    Spouse name: Not on file   Number of children: Not on file   Years of education: Not on file   Highest education level: Not on file  Occupational History   Not on file  Tobacco Use   Smoking status: Never   Smokeless tobacco: Never  Substance and Sexual Activity   Alcohol use: No   Drug use: No   Sexual activity: Not on file  Other Topics Concern   Not on file  Social History Narrative   Not on file   Social Drivers of Health   Financial Resource Strain: Not on file  Food Insecurity: Not on file  Transportation Needs: Not on file  Physical Activity: Not on file  Stress: Not on file  Social Connections: Not on file  Intimate Partner Violence: Not on file      Review of Systems  Constitutional:  Negative for activity change, appetite change, chills, fatigue, fever and unexpected weight change.  HENT: Negative.  Negative for congestion, ear pain, rhinorrhea, sore throat and trouble swallowing.   Eyes: Negative.   Respiratory: Negative.  Negative for cough, chest tightness, shortness of breath and wheezing.   Cardiovascular: Negative.  Negative for chest pain.  Gastrointestinal: Negative.   Negative for abdominal pain, blood in stool, constipation, diarrhea, nausea and vomiting.  Endocrine: Negative.   Genitourinary: Negative.  Negative for difficulty urinating, dysuria, frequency, hematuria and urgency.  Musculoskeletal: Negative.  Negative for arthralgias, back pain, joint swelling, myalgias and neck pain.  Skin: Negative.  Negative for rash and wound.  Allergic/Immunologic: Negative.  Negative for immunocompromised state.  Neurological: Negative.  Negative for dizziness, seizures, numbness and headaches.  Hematological: Negative.   Psychiatric/Behavioral: Negative.  Negative for behavioral problems, self-injury and suicidal ideas. The patient is not nervous/anxious.     Vital Signs: BP 130/80   Pulse 80   Temp 98.3 F (36.8 C)   Resp 16   Ht 5\' 3"  (1.6 m)   Wt 133 lb 6.4 oz (60.5 kg)   SpO2 99%   BMI 23.63 kg/m    Physical Exam Vitals reviewed.  Constitutional:      General: She is not in acute distress.    Appearance: Normal appearance. She is normal weight. She is not ill-appearing.  HENT:     Head: Normocephalic and atraumatic.  Eyes:     Pupils: Pupils are equal, round, and reactive to light.  Cardiovascular:     Rate and Rhythm: Normal rate and regular rhythm.     Heart sounds: Normal heart sounds. No murmur heard. Pulmonary:     Effort: Pulmonary effort is normal. No respiratory distress.     Breath sounds: Normal breath sounds. No wheezing.  Skin:    Capillary Refill: Capillary refill takes less than 2 seconds.  Neurological:     Mental Status: She is alert and oriented to person, place, and time.  Psychiatric:        Mood and Affect: Mood normal.        Behavior: Behavior normal.        Assessment/Plan: 1. Encounter for subsequent annual wellness visit (AWV) in Medicare patient (Primary) Age-appropriate preventive screenings and vaccinations discussed. Routine labs for health maintenance ordered in april. PHM updated.    2. Dental  infection Amoxicillin prescribed.  - amoxicillin (AMOXIL) 875 MG tablet; Take 1 tablet (875 mg total) by mouth 2 (two) times daily for 10 days.  Dispense: 20 tablet; Refill: 0  3. NAFLD (nonalcoholic fatty liver disease) Repeat labs in april  4. Elevated hemidiaphragm Stable, no significant changes. Declines further evaluation  5. 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 Gastroesophageal reflux disease without esophagitis Continue omeprazole as prescribed.  - omeprazole (PRILOSEC) 40 MG capsule; Take 1 capsule (40 mg total) by mouth daily.  Dispense: 30 capsule; Refill: 6  7. Generalized anxiety disorder Continue diazepam as prescribed. Follow up in 3 months for additional refills. - diazepam (VALIUM) 5 MG tablet; Take 1 tablet (5 mg total) by mouth every 12 (twelve) hours as needed for anxiety. Or sleep  Dispense: 60 tablet; Refill: 2  General Counseling: Alia verbalizes understanding of the findings of todays visit and agrees with plan of treatment. I have discussed any further diagnostic evaluation that may be needed or ordered today. We also reviewed her medications today. she has been encouraged to call the office with any questions or concerns that should arise related to todays visit.    Orders Placed This Encounter  Procedures   DG Bone Density    Meds ordered this encounter  Medications   omeprazole (PRILOSEC) 40 MG capsule    Sig: Take 1 capsule (40 mg total) by mouth daily.    Dispense:  30 capsule    Refill:  6   diazepam (VALIUM) 5 MG tablet    Sig: Take 1 tablet (5 mg total) by mouth every 12 (twelve) hours as needed for anxiety. Or sleep    Dispense:  60 tablet    Refill:  2    Please discontinue any prior orders for diazepam, fill  new script now, patient is due for refill.   amoxicillin (AMOXIL) 875 MG tablet    Sig: Take 1 tablet (875 mg total) by mouth 2 (two) times daily for 10 days.    Dispense:  20 tablet     Refill:  0    Return in about 3 months (around 12/03/2023) for F/U, anxiety med refill, Ayyan Sites PCP.   Total time spent:30 Minutes Time spent includes review of chart, medications, test results, and follow up plan with the patient.   [place] Controlled Substance Database was reviewed by me.  This patient was seen by Sallyanne Kuster, FNP-C in collaboration with Dr. Beverely Risen as a part of collaborative care agreement.  Floreen Teegarden R. Tedd Sias, MSN, FNP-C Internal medicine

## 2023-09-15 DIAGNOSIS — M47812 Spondylosis without myelopathy or radiculopathy, cervical region: Secondary | ICD-10-CM | POA: Diagnosis not present

## 2023-09-15 DIAGNOSIS — M47816 Spondylosis without myelopathy or radiculopathy, lumbar region: Secondary | ICD-10-CM | POA: Diagnosis not present

## 2023-09-15 DIAGNOSIS — G894 Chronic pain syndrome: Secondary | ICD-10-CM | POA: Diagnosis not present

## 2023-09-15 DIAGNOSIS — M7551 Bursitis of right shoulder: Secondary | ICD-10-CM | POA: Diagnosis not present

## 2023-09-18 ENCOUNTER — Ambulatory Visit: Payer: Medicare Other | Admitting: Nurse Practitioner

## 2023-11-10 DIAGNOSIS — G894 Chronic pain syndrome: Secondary | ICD-10-CM | POA: Diagnosis not present

## 2023-11-10 DIAGNOSIS — M47812 Spondylosis without myelopathy or radiculopathy, cervical region: Secondary | ICD-10-CM | POA: Diagnosis not present

## 2023-11-10 DIAGNOSIS — M7551 Bursitis of right shoulder: Secondary | ICD-10-CM | POA: Diagnosis not present

## 2023-11-10 DIAGNOSIS — M47816 Spondylosis without myelopathy or radiculopathy, lumbar region: Secondary | ICD-10-CM | POA: Diagnosis not present

## 2023-11-26 DIAGNOSIS — K080 Exfoliation of teeth due to systemic causes: Secondary | ICD-10-CM | POA: Diagnosis not present

## 2023-12-10 ENCOUNTER — Ambulatory Visit (INDEPENDENT_AMBULATORY_CARE_PROVIDER_SITE_OTHER): Payer: Medicare Other | Admitting: Nurse Practitioner

## 2023-12-10 ENCOUNTER — Encounter: Payer: Self-pay | Admitting: Nurse Practitioner

## 2023-12-10 VITALS — BP 126/74 | HR 82 | Temp 98.2°F | Resp 16 | Ht 63.0 in | Wt 134.6 lb

## 2023-12-10 DIAGNOSIS — E782 Mixed hyperlipidemia: Secondary | ICD-10-CM

## 2023-12-10 DIAGNOSIS — J986 Disorders of diaphragm: Secondary | ICD-10-CM

## 2023-12-10 DIAGNOSIS — F411 Generalized anxiety disorder: Secondary | ICD-10-CM

## 2023-12-10 DIAGNOSIS — K219 Gastro-esophageal reflux disease without esophagitis: Secondary | ICD-10-CM

## 2023-12-10 DIAGNOSIS — K760 Fatty (change of) liver, not elsewhere classified: Secondary | ICD-10-CM | POA: Diagnosis not present

## 2023-12-10 DIAGNOSIS — E559 Vitamin D deficiency, unspecified: Secondary | ICD-10-CM

## 2023-12-10 DIAGNOSIS — E538 Deficiency of other specified B group vitamins: Secondary | ICD-10-CM

## 2023-12-10 MED ORDER — OMEPRAZOLE 40 MG PO CPDR: 40.0000 mg | DELAYED_RELEASE_CAPSULE | Freq: Every day | ORAL | 6 refills | Status: DC

## 2023-12-10 MED ORDER — DIAZEPAM 5 MG PO TABS: 5.0000 mg | ORAL_TABLET | Freq: Two times a day (BID) | ORAL | 2 refills | Status: DC | PRN

## 2023-12-10 NOTE — Progress Notes (Signed)
 [facility] [address] [place], [place] [address]  Internal MEDICINE  Office Visit Note  Patient Name: Morgan Abbott  [id number]  [id number]  Date of Service: 12/10/2023  Chief Complaint  Patient presents with   Hyperlipidemia   Follow-up    HPI Kalaysia presents for a follow-up visit for chronic cough, anxiety, GERD and lab ordered.  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 She is still not interested in CT surgery referral. Anxiety -- takes valium  prn chronically, due for refills today.  GERD -- symptoms controlled with omeprazole .  Due for labs     Current Medication: Outpatient Encounter Medications as of 12/10/2023  Medication Sig   benzonatate  (TESSALON ) 200 MG capsule Take 1 capsule (200 mg total) by mouth 2 (two) times daily as needed for cough.   betamethasone  dipropionate 0.05 % cream Apply topically 2 (two) times daily.   clobetasol  ointment (TEMOVATE ) 0.05 % Apply 1 Application topically 2 (two) times daily. To rash on legs until resolved.   dextromethorphan-guaiFENesin  (MUCINEX  DM) 30-600 MG 12hr tablet Take 1 tablet by mouth 2 (two) times daily.   ergocalciferol  (VITAMIN D2) 1.25 MG (50000 UT) capsule Take 1 capsule (50,000 Units total) by mouth every 14 (fourteen) days.   oxyCODONE -acetaminophen  (PERCOCET) 10-325 MG tablet Take by mouth. 1 TAB BY MOUTH EVERY 12 HRS X 10 DAYS, 1 TAB EVERY 8 HRS X 10 DAYS THEN 1 TAB EVERY 6 HRS X 10 DAYS   [DISCONTINUED] diazepam  (VALIUM ) 5 MG tablet Take 1 tablet (5 mg total) by mouth every 12 (twelve) hours as needed for anxiety. Or sleep   [DISCONTINUED] omeprazole  (PRILOSEC) 40 MG capsule Take 1 capsule (40 mg total) by mouth daily.   albuterol  (VENTOLIN  HFA) 108 (90 Base) MCG/ACT inhaler Inhale 2 puffs into the lungs every 6 (six) hours as needed for up to 7 days for wheezing or shortness of breath.   diazepam  (VALIUM ) 5 MG tablet Take 1 tablet (5 mg total) by mouth every 12 (twelve)  hours as needed for anxiety. Or sleep   omeprazole  (PRILOSEC) 40 MG capsule Take 1 capsule (40 mg total) by mouth daily.   No facility-administered encounter medications on file as of 12/10/2023.    Surgical History: Past Surgical History:  Procedure Laterality Date   CHOLECYSTECTOMY  1996   COLON RESECTION     dilatation and curettage     TONSILLECTOMY Bilateral     Medical History: Past Medical History:  Diagnosis Date   Allergic rhinitis    Arthritis    Bronchitis    Hyperlipidemia    Migraines    RSV (acute bronchiolitis due to respiratory syncytial virus)     Family History: Family History  Problem Relation Age of Onset   Hypertension Mother    Bladder Cancer Father        72 yrs old when passed away    Social History   Socioeconomic History   Marital status: Married    Spouse name: Not on file   Number of children: Not on file   Years of education: Not on file   Highest education level: Not on file  Occupational History   Not on file  Tobacco Use   Smoking status: Never   Smokeless tobacco: Never  Substance and Sexual Activity   Alcohol use: No   Drug use: No   Sexual activity: Not on file  Other Topics Concern   Not on file  Social History Narrative  Not on file   Social Drivers of Health   Financial Resource Strain: Not on file  Food Insecurity: Not on file  Transportation Needs: Not on file  Physical Activity: Not on file  Stress: Not on file  Social Connections: Not on file  Intimate Partner Violence: Not on file      Review of Systems  Vital Signs: BP 126/74   Pulse 82   Temp 98.2 F (36.8 C)   Resp 16   Ht 5\' 3"  (1.6 m)   Wt 134 lb 9.6 oz (61.1 kg)   SpO2 97%   BMI 23.84 kg/m    Physical Exam     Assessment/Plan: 1. NAFLD (nonalcoholic fatty liver disease) (Primary) Routine labs ordered.  - CBC with Differential/Platelet - CMP14+EGFR - Lipid Profile - B12 and Folate Panel - Vitamin D  (25 hydroxy)  2.  Elevated hemidiaphragm Continue cough medication as needed. Declined CT surgery referral again. Continue to monitor symptoms periodically, continue annual CXR to evaluate severity of elevation.   3. Mixed hyperlipidemia Routine labs ordered  - CBC with Differential/Platelet - CMP14+EGFR - Lipid Profile - B12 and Folate Panel - Vitamin D  (25 hydroxy)  4. Gastroesophageal reflux disease without esophagitis Continue omeprazole  as prescribed. Routine labs ordered  - omeprazole  (PRILOSEC) 40 MG capsule; Take 1 capsule (40 mg total) by mouth daily.  Dispense: 30 capsule; Refill: 6 - CBC with Differential/Platelet - CMP14+EGFR - Lipid Profile - B12 and Folate Panel - Vitamin D  (25 hydroxy)  5. B12 deficiency Routine labs ordered  - CBC with Differential/Platelet - CMP14+EGFR - Lipid Profile - B12 and Folate Panel - Vitamin D  (25 hydroxy)  6. Vitamin D  deficiency Routine labs ordered  - CBC with Differential/Platelet - CMP14+EGFR - Lipid Profile - B12 and Folate Panel - Vitamin D  (25 hydroxy)  7. Generalized anxiety disorder Contineu diazepam  prn as prescribed. Follow up in 3 months for additional refills  - diazepam  (VALIUM ) 5 MG tablet; Take 1 tablet (5 mg total) by mouth every 12 (twelve) hours as needed for anxiety. Or sleep  Dispense: 60 tablet; Refill: 2   General Counseling: Imogen verbalizes understanding of the findings of todays visit and agrees with plan of treatment. I have discussed any further diagnostic evaluation that may be needed or ordered today. We also reviewed her medications today. she has been encouraged to call the office with any questions or concerns that should arise related to todays visit.    Orders Placed This Encounter  Procedures   CBC with Differential/Platelet   CMP14+EGFR   Lipid Profile   B12 and Folate Panel   Vitamin D  (25 hydroxy)    Meds ordered this encounter  Medications   diazepam  (VALIUM ) 5 MG tablet    Sig: Take 1 tablet (5  mg total) by mouth every 12 (twelve) hours as needed for anxiety. Or sleep    Dispense:  60 tablet    Refill:  2    Please discontinue any prior orders for diazepam , fill new script now, patient is due for refill.   omeprazole  (PRILOSEC) 40 MG capsule    Sig: Take 1 capsule (40 mg total) by mouth daily.    Dispense:  30 capsule    Refill:  6    Return in about 12 weeks (around 03/03/2024) for F/U, anxiety med refill, Shilah Hefel PCP diazepam .   Total time spent:30 Minutes Time spent includes review of chart, medications, test results, and follow up plan with the patient.   [place]  Controlled Substance Database was reviewed by me.  This patient was seen by Laurence Pons, FNP-C in collaboration with Dr. Verneta Gone as a part of collaborative care agreement.   Bert Ptacek R. Bobbi Burow, MSN, FNP-C Internal medicine

## 2024-01-08 DIAGNOSIS — M7551 Bursitis of right shoulder: Secondary | ICD-10-CM | POA: Diagnosis not present

## 2024-01-08 DIAGNOSIS — G894 Chronic pain syndrome: Secondary | ICD-10-CM | POA: Diagnosis not present

## 2024-01-08 DIAGNOSIS — M47816 Spondylosis without myelopathy or radiculopathy, lumbar region: Secondary | ICD-10-CM | POA: Diagnosis not present

## 2024-01-08 DIAGNOSIS — M47812 Spondylosis without myelopathy or radiculopathy, cervical region: Secondary | ICD-10-CM | POA: Diagnosis not present

## 2024-03-03 ENCOUNTER — Ambulatory Visit: Admitting: Nurse Practitioner

## 2024-03-03 DIAGNOSIS — M47816 Spondylosis without myelopathy or radiculopathy, lumbar region: Secondary | ICD-10-CM | POA: Diagnosis not present

## 2024-03-03 DIAGNOSIS — Z79891 Long term (current) use of opiate analgesic: Secondary | ICD-10-CM | POA: Diagnosis not present

## 2024-03-03 DIAGNOSIS — M47812 Spondylosis without myelopathy or radiculopathy, cervical region: Secondary | ICD-10-CM | POA: Diagnosis not present

## 2024-03-03 DIAGNOSIS — M7551 Bursitis of right shoulder: Secondary | ICD-10-CM | POA: Diagnosis not present

## 2024-03-03 DIAGNOSIS — G894 Chronic pain syndrome: Secondary | ICD-10-CM | POA: Diagnosis not present

## 2024-03-16 ENCOUNTER — Ambulatory Visit: Admitting: Nurse Practitioner

## 2024-03-16 ENCOUNTER — Encounter: Payer: Self-pay | Admitting: Nurse Practitioner

## 2024-03-16 VITALS — BP 136/80 | HR 80 | Temp 97.5°F | Resp 16 | Ht 63.0 in | Wt 132.4 lb

## 2024-03-16 DIAGNOSIS — K219 Gastro-esophageal reflux disease without esophagitis: Secondary | ICD-10-CM | POA: Diagnosis not present

## 2024-03-16 DIAGNOSIS — K760 Fatty (change of) liver, not elsewhere classified: Secondary | ICD-10-CM

## 2024-03-16 DIAGNOSIS — J986 Disorders of diaphragm: Secondary | ICD-10-CM

## 2024-03-16 DIAGNOSIS — F411 Generalized anxiety disorder: Secondary | ICD-10-CM

## 2024-03-16 MED ORDER — DIAZEPAM 5 MG PO TABS
5.0000 mg | ORAL_TABLET | Freq: Two times a day (BID) | ORAL | 2 refills | Status: DC | PRN
Start: 2024-03-16 — End: 2024-06-16

## 2024-03-16 NOTE — Progress Notes (Signed)
 [facility] [address] [place], [place] [address]  Internal MEDICINE  Office Visit Note  Patient Name: Morgan Abbott  [id number]  [id number]  Date of Service: 03/16/2024  Chief Complaint  Patient presents with   Hyperlipidemia   Follow-up    HPI Morgan Abbott presents for a follow-up visit for chronic cough, anxiety, NAFLD, and GERD.  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 Patient still does not wish to see CT surgery for this problem.  Anxiety -- taking diazepam  as needed, due for refills.  NAFLD -- periodically checking labs but overall no issues at this time.  GERD -- symptoms controlled with omeprazole .     Current Medication: Outpatient Encounter Medications as of 03/16/2024  Medication Sig   albuterol  (VENTOLIN  HFA) 108 (90 Base) MCG/ACT inhaler Inhale 2 puffs into the lungs every 6 (six) hours as needed for up to 7 days for wheezing or shortness of breath.   benzonatate  (TESSALON ) 200 MG capsule Take 1 capsule (200 mg total) by mouth 2 (two) times daily as needed for cough.   betamethasone  dipropionate 0.05 % cream Apply topically 2 (two) times daily.   clobetasol  ointment (TEMOVATE ) 0.05 % Apply 1 Application topically 2 (two) times daily. To rash on legs until resolved.   dextromethorphan-guaiFENesin  (MUCINEX  DM) 30-600 MG 12hr tablet Take 1 tablet by mouth 2 (two) times daily.   diazepam  (VALIUM ) 5 MG tablet Take 1 tablet (5 mg total) by mouth every 12 (twelve) hours as needed for anxiety. Or sleep   ergocalciferol  (VITAMIN D2) 1.25 MG (50000 UT) capsule Take 1 capsule (50,000 Units total) by mouth every 14 (fourteen) days.   omeprazole  (PRILOSEC) 40 MG capsule Take 1 capsule (40 mg total) by mouth daily.   oxyCODONE -acetaminophen  (PERCOCET) 10-325 MG tablet Take by mouth. 1 TAB BY MOUTH EVERY 12 HRS X 10 DAYS, 1 TAB EVERY 8 HRS X 10 DAYS THEN 1 TAB EVERY 6 HRS X 10 DAYS   [DISCONTINUED] diazepam  (VALIUM ) 5 MG tablet Take 1 tablet (5 mg total) by mouth every  12 (twelve) hours as needed for anxiety. Or sleep   No facility-administered encounter medications on file as of 03/16/2024.    Surgical History: Past Surgical History:  Procedure Laterality Date   CHOLECYSTECTOMY  1996   COLON RESECTION     dilatation and curettage     TONSILLECTOMY Bilateral     Medical History: Past Medical History:  Diagnosis Date   Allergic rhinitis    Arthritis    Bronchitis    Hyperlipidemia    Migraines    RSV (acute bronchiolitis due to respiratory syncytial virus)     Family History: Family History  Problem Relation Age of Onset   Hypertension Mother    Bladder Cancer Father        47 yrs old when passed away    Social History   Socioeconomic History   Marital status: Married    Spouse name: Not on file   Number of children: Not on file   Years of education: Not on file   Highest education level: Not on file  Occupational History   Not on file  Tobacco Use   Smoking status: Never   Smokeless tobacco: Never  Substance and Sexual Activity   Alcohol use: No   Drug use: No   Sexual activity: Not on file  Other Topics Concern   Not on file  Social History Narrative   Not on file   Social Drivers of Health  Financial Resource Strain: Not on file  Food Insecurity: Not on file  Transportation Needs: Not on file  Physical Activity: Not on file  Stress: Not on file  Social Connections: Not on file  Intimate Partner Violence: Not on file      Review of Systems  Constitutional:  Positive for fatigue and unexpected weight change. Negative for chills.  HENT:  Negative for congestion, rhinorrhea, sneezing and sore throat.   Eyes:  Negative for redness.  Respiratory:  Positive for cough. Negative for chest tightness, shortness of breath and wheezing.   Cardiovascular:  Negative for chest pain and palpitations.  Gastrointestinal:  Negative for abdominal pain, constipation, diarrhea, nausea and vomiting.  Genitourinary:  Negative for  dysuria and frequency.  Musculoskeletal:  Negative for arthralgias, back pain, joint swelling and neck pain.  Skin:  Negative for rash.  Neurological: Negative.  Negative for tremors and numbness.  Hematological:  Negative for adenopathy. Does not bruise/bleed easily.  Psychiatric/Behavioral:  Positive for sleep disturbance. Negative for behavioral problems (Depression), self-injury and suicidal ideas. The patient is nervous/anxious.     Vital Signs: BP 136/80   Pulse 80   Temp (!) 97.5 F (36.4 C)   Resp 16   Ht 5' 3 (1.6 m)   Wt 132 lb 6.4 oz (60.1 kg)   SpO2 94%   BMI 23.45 kg/m    Physical Exam Vitals reviewed.  Constitutional:      General: She is not in acute distress.    Appearance: Normal appearance. She is normal weight. She is not ill-appearing.  HENT:     Head: Normocephalic and atraumatic.  Eyes:     Pupils: Pupils are equal, round, and reactive to light.  Cardiovascular:     Rate and Rhythm: Normal rate and regular rhythm.  Pulmonary:     Effort: Pulmonary effort is normal. No respiratory distress.  Neurological:     Mental Status: She is alert and oriented to person, place, and time.  Psychiatric:        Mood and Affect: Mood normal.        Behavior: Behavior normal.        Assessment/Plan: 1. NAFLD (nonalcoholic fatty liver disease) (Primary) Will check labs with her annual wellness visit.   2. Elevated hemidiaphragm Declined CT surgery referral, will continue to reassess and offer referral and patient aware that she can call the clinic if she changes her mind.   3. Gastroesophageal reflux disease without esophagitis Continue omeprazole  as prescribed.   4. Generalized anxiety disorder Continue diazepam  as prescribed. Follow up in 3 months for additional refills.  - diazepam  (VALIUM ) 5 MG tablet; Take 1 tablet (5 mg total) by mouth every 12 (twelve) hours as needed for anxiety. Or sleep  Dispense: 60 tablet; Refill: 2   General Counseling:  Morgan Abbott verbalizes understanding of the findings of todays visit and agrees with plan of treatment. I have discussed any further diagnostic evaluation that may be needed or ordered today. We also reviewed her medications today. she has been encouraged to call the office with any questions or concerns that should arise related to todays visit.    No orders of the defined types were placed in this encounter.   Meds ordered this encounter  Medications   diazepam  (VALIUM ) 5 MG tablet    Sig: Take 1 tablet (5 mg total) by mouth every 12 (twelve) hours as needed for anxiety. Or sleep    Dispense:  60 tablet    Refill:  2    For future refills    Return in about 3 months (around 06/09/2024) for F/U, anxiety med refill, Yesenia Locurto PCP.   Total time spent:30 Minutes Time spent includes review of chart, medications, test results, and follow up plan with the patient.   [place] Controlled Substance Database was reviewed by me.  This patient was seen by Mardy Maxin, FNP-C in collaboration with Dr. Sigrid Bathe as a part of collaborative care agreement.   Gwenneth Whiteman R. Maxin, MSN, FNP-C Internal medicine

## 2024-03-17 DIAGNOSIS — K080 Exfoliation of teeth due to systemic causes: Secondary | ICD-10-CM | POA: Diagnosis not present

## 2024-03-19 ENCOUNTER — Encounter: Payer: Self-pay | Admitting: Nurse Practitioner

## 2024-04-26 ENCOUNTER — Ambulatory Visit
Admission: RE | Admit: 2024-04-26 | Discharge: 2024-04-26 | Disposition: A | Discharge status: Home / Self Care | Source: Ambulatory Visit | Attending: Physical Medicine and Rehabilitation | Admitting: Physical Medicine and Rehabilitation

## 2024-04-26 ENCOUNTER — Other Ambulatory Visit: Payer: Self-pay | Admitting: Physical Medicine and Rehabilitation

## 2024-04-26 ENCOUNTER — Ambulatory Visit
Admission: RE | Admit: 2024-04-26 | Discharge: 2024-04-26 | Disposition: A | Discharge status: Home / Self Care | Attending: Physical Medicine and Rehabilitation | Admitting: Physical Medicine and Rehabilitation

## 2024-04-26 DIAGNOSIS — M545 Low back pain, unspecified: Secondary | ICD-10-CM | POA: Insufficient documentation

## 2024-04-26 DIAGNOSIS — M8580 Other specified disorders of bone density and structure, unspecified site: Secondary | ICD-10-CM | POA: Diagnosis not present

## 2024-04-26 DIAGNOSIS — M4316 Spondylolisthesis, lumbar region: Secondary | ICD-10-CM | POA: Diagnosis not present

## 2024-04-26 DIAGNOSIS — M5126 Other intervertebral disc displacement, lumbar region: Secondary | ICD-10-CM | POA: Diagnosis not present

## 2024-04-26 DIAGNOSIS — M47816 Spondylosis without myelopathy or radiculopathy, lumbar region: Secondary | ICD-10-CM | POA: Diagnosis not present

## 2024-04-28 DIAGNOSIS — M47812 Spondylosis without myelopathy or radiculopathy, cervical region: Secondary | ICD-10-CM | POA: Diagnosis not present

## 2024-04-28 DIAGNOSIS — G894 Chronic pain syndrome: Secondary | ICD-10-CM | POA: Diagnosis not present

## 2024-04-28 DIAGNOSIS — M47816 Spondylosis without myelopathy or radiculopathy, lumbar region: Secondary | ICD-10-CM | POA: Diagnosis not present

## 2024-04-28 DIAGNOSIS — M7551 Bursitis of right shoulder: Secondary | ICD-10-CM | POA: Diagnosis not present

## 2024-04-29 DIAGNOSIS — K219 Gastro-esophageal reflux disease without esophagitis: Secondary | ICD-10-CM | POA: Diagnosis not present

## 2024-04-29 DIAGNOSIS — E559 Vitamin D deficiency, unspecified: Secondary | ICD-10-CM | POA: Diagnosis not present

## 2024-04-29 DIAGNOSIS — E538 Deficiency of other specified B group vitamins: Secondary | ICD-10-CM | POA: Diagnosis not present

## 2024-04-29 DIAGNOSIS — K760 Fatty (change of) liver, not elsewhere classified: Secondary | ICD-10-CM | POA: Diagnosis not present

## 2024-04-29 DIAGNOSIS — E782 Mixed hyperlipidemia: Secondary | ICD-10-CM | POA: Diagnosis not present

## 2024-05-01 LAB — CBC WITH DIFFERENTIAL/PLATELET
Basophils Absolute: 0.1 x10E3/uL (ref 0.0–0.2)
Basos: 1 %
EOS (ABSOLUTE): 0.2 x10E3/uL (ref 0.0–0.4)
Eos: 3 %
Hematocrit: 36.2 % (ref 34.0–46.6)
Hemoglobin: 11.8 g/dL (ref 11.1–15.9)
Immature Grans (Abs): 0 x10E3/uL (ref 0.0–0.1)
Immature Granulocytes: 0 %
Lymphocytes Absolute: 1.5 x10E3/uL (ref 0.7–3.1)
Lymphs: 34 %
MCH: 30.3 pg (ref 26.6–33.0)
MCHC: 32.6 g/dL (ref 31.5–35.7)
MCV: 93 fL (ref 79–97)
Monocytes Absolute: 0.4 x10E3/uL (ref 0.1–0.9)
Monocytes: 8 %
Neutrophils Absolute: 2.4 x10E3/uL (ref 1.4–7.0)
Neutrophils: 54 %
Platelets: 174 x10E3/uL (ref 150–450)
RBC: 3.89 x10E6/uL (ref 3.77–5.28)
RDW: 13.4 % (ref 11.7–15.4)
WBC: 4.5 x10E3/uL (ref 3.4–10.8)

## 2024-05-01 LAB — CMP14+EGFR
ALT: 8 IU/L (ref 0–32)
AST: 17 IU/L (ref 0–40)
Albumin: 4.3 g/dL (ref 3.8–4.8)
Alkaline Phosphatase: 129 IU/L — ABNORMAL HIGH (ref 44–121)
BUN/Creatinine Ratio: 13 (ref 12–28)
BUN: 12 mg/dL (ref 8–27)
Bilirubin Total: 1.2 mg/dL (ref 0.0–1.2)
CO2: 23 mmol/L (ref 20–29)
Calcium: 9.2 mg/dL (ref 8.7–10.3)
Chloride: 107 mmol/L — ABNORMAL HIGH (ref 96–106)
Creatinine, Ser: 0.89 mg/dL (ref 0.57–1.00)
Globulin, Total: 2.3 g/dL (ref 1.5–4.5)
Glucose: 87 mg/dL (ref 70–99)
Potassium: 4.1 mmol/L (ref 3.5–5.2)
Sodium: 146 mmol/L — ABNORMAL HIGH (ref 134–144)
Total Protein: 6.6 g/dL (ref 6.0–8.5)
eGFR: 67 mL/min/1.73 (ref >59)

## 2024-05-01 LAB — LIPID PANEL
Chol/HDL Ratio: 2.8 ratio (ref 0.0–4.4)
Cholesterol, Total: 146 mg/dL (ref 100–199)
HDL: 52 mg/dL (ref >39)
LDL Chol Calc (NIH): 76 mg/dL (ref 0–99)
Triglycerides: 94 mg/dL (ref 0–149)
VLDL Cholesterol Cal: 18 mg/dL (ref 5–40)

## 2024-05-01 LAB — B12 AND FOLATE PANEL
Folate: 4.3 ng/mL (ref >3.0)
Vitamin B-12: 314 pg/mL (ref 232–1245)

## 2024-05-01 LAB — VITAMIN D 25 HYDROXY (VIT D DEFICIENCY, FRACTURES): Vit D, 25-Hydroxy: 22.9 ng/mL — ABNORMAL LOW (ref 30.0–100.0)

## 2024-06-09 DIAGNOSIS — G894 Chronic pain syndrome: Secondary | ICD-10-CM | POA: Diagnosis not present

## 2024-06-09 DIAGNOSIS — M7551 Bursitis of right shoulder: Secondary | ICD-10-CM | POA: Diagnosis not present

## 2024-06-09 DIAGNOSIS — M47816 Spondylosis without myelopathy or radiculopathy, lumbar region: Secondary | ICD-10-CM | POA: Diagnosis not present

## 2024-06-09 DIAGNOSIS — M47812 Spondylosis without myelopathy or radiculopathy, cervical region: Secondary | ICD-10-CM | POA: Diagnosis not present

## 2024-06-16 ENCOUNTER — Ambulatory Visit: Admitting: Nurse Practitioner

## 2024-06-16 ENCOUNTER — Encounter: Payer: Self-pay | Admitting: Nurse Practitioner

## 2024-06-16 VITALS — BP 132/78 | HR 95 | Temp 97.3°F | Resp 16 | Ht 63.0 in | Wt 130.2 lb

## 2024-06-16 DIAGNOSIS — E2839 Other primary ovarian failure: Secondary | ICD-10-CM | POA: Diagnosis not present

## 2024-06-16 DIAGNOSIS — F330 Major depressive disorder, recurrent, mild: Secondary | ICD-10-CM

## 2024-06-16 DIAGNOSIS — K760 Fatty (change of) liver, not elsewhere classified: Secondary | ICD-10-CM

## 2024-06-16 DIAGNOSIS — M5442 Lumbago with sciatica, left side: Secondary | ICD-10-CM

## 2024-06-16 DIAGNOSIS — K219 Gastro-esophageal reflux disease without esophagitis: Secondary | ICD-10-CM | POA: Diagnosis not present

## 2024-06-16 DIAGNOSIS — M5441 Lumbago with sciatica, right side: Secondary | ICD-10-CM

## 2024-06-16 DIAGNOSIS — E559 Vitamin D deficiency, unspecified: Secondary | ICD-10-CM

## 2024-06-16 DIAGNOSIS — G8929 Other chronic pain: Secondary | ICD-10-CM

## 2024-06-16 DIAGNOSIS — F411 Generalized anxiety disorder: Secondary | ICD-10-CM

## 2024-06-16 DIAGNOSIS — R053 Chronic cough: Secondary | ICD-10-CM | POA: Diagnosis not present

## 2024-06-16 DIAGNOSIS — E538 Deficiency of other specified B group vitamins: Secondary | ICD-10-CM

## 2024-06-16 MED ORDER — DIAZEPAM 5 MG PO TABS: 5.0000 mg | ORAL_TABLET | Freq: Two times a day (BID) | ORAL | 2 refills | Status: DC | PRN

## 2024-06-16 MED ORDER — CYANOCOBALAMIN 1000 MCG/ML IJ SOLN
1000.0000 ug | Freq: Once | INTRAMUSCULAR | Status: AC
  Administered 2024-06-16: 1000 ug via INTRAMUSCULAR

## 2024-06-16 NOTE — Progress Notes (Signed)
 [facility] [address] [place], [place] [address]  Internal MEDICINE  Office Visit Note  Patient Name: Morgan Abbott  [id number]  [id number]  Date of Service: 06/16/2024  Chief Complaint  Patient presents with   Hyperlipidemia   Follow-up    HPI Cassidey presents for a follow-up visit for lab results, low vitamin D , low B12, GERD, chronic cough, chronic back pain, NAFLD, anxiety and depression.  Low back pain -- seeing specialist, recent lumbar spine xray shows worsening degenerative changes that are moderate to severe.  Low vitamin D  22.9 Borderline low B12 -- requesting B12 injection  GERD -- takes omeprazole  daily.  NAFLD -- not currently on any medications for this. Gets her LFTS checked periodically.  Due for BMD screening GAD -- takes valium  as needed, due for refills.    Current Medication: Outpatient Encounter Medications as of 06/16/2024  Medication Sig   albuterol  (VENTOLIN  HFA) 108 (90 Base) MCG/ACT inhaler Inhale 2 puffs into the lungs every 6 (six) hours as needed for up to 7 days for wheezing or shortness of breath.   benzonatate  (TESSALON ) 200 MG capsule Take 1 capsule (200 mg total) by mouth 2 (two) times daily as needed for cough.   betamethasone  dipropionate 0.05 % cream Apply topically 2 (two) times daily.   clobetasol  ointment (TEMOVATE ) 0.05 % Apply 1 Application topically 2 (two) times daily. To rash on legs until resolved.   dextromethorphan-guaiFENesin  (MUCINEX  DM) 30-600 MG 12hr tablet Take 1 tablet by mouth 2 (two) times daily.   diazepam  (VALIUM ) 5 MG tablet Take 1 tablet (5 mg total) by mouth every 12 (twelve) hours as needed for anxiety. Or sleep   ergocalciferol  (VITAMIN D2) 1.25 MG (50000 UT) capsule Take 1 capsule (50,000 Units total) by mouth every 14 (fourteen) days.   omeprazole  (PRILOSEC) 40 MG capsule Take 1 capsule (40 mg total) by mouth daily.   oxyCODONE -acetaminophen  (PERCOCET) 10-325 MG tablet Take by mouth. 1 TAB BY MOUTH  EVERY 12 HRS X 10 DAYS, 1 TAB EVERY 8 HRS X 10 DAYS THEN 1 TAB EVERY 6 HRS X 10 DAYS   [DISCONTINUED] diazepam  (VALIUM ) 5 MG tablet Take 1 tablet (5 mg total) by mouth every 12 (twelve) hours as needed for anxiety. Or sleep   [EXPIRED] cyanocobalamin  (VITAMIN B12) injection 1,000 mcg    No facility-administered encounter medications on file as of 06/16/2024.    Surgical History: Past Surgical History:  Procedure Laterality Date   CHOLECYSTECTOMY  1996   COLON RESECTION     dilatation and curettage     TONSILLECTOMY Bilateral     Medical History: Past Medical History:  Diagnosis Date   Allergic rhinitis    Arthritis    Bronchitis    Hyperlipidemia    Migraines    RSV (acute bronchiolitis due to respiratory syncytial virus)     Family History: Family History  Problem Relation Age of Onset   Hypertension Mother    Bladder Cancer Father        4 yrs old when passed away    Social History   Socioeconomic History   Marital status: Married    Spouse name: Not on file   Number of children: Not on file   Years of education: Not on file   Highest education level: Not on file  Occupational History   Not on file  Tobacco Use   Smoking status: Never   Smokeless tobacco: Never  Substance and Sexual Activity   Alcohol use:  No   Drug use: No   Sexual activity: Not on file  Other Topics Concern   Not on file  Social History Narrative   Not on file   Social Drivers of Health   Financial Resource Strain: Not on file  Food Insecurity: Not on file  Transportation Needs: Not on file  Physical Activity: Not on file  Stress: Not on file  Social Connections: Not on file  Intimate Partner Violence: Not on file      Review of Systems  Constitutional:  Positive for fatigue and unexpected weight change. Negative for chills.  HENT:  Negative for congestion, rhinorrhea, sneezing and sore throat.   Eyes:  Negative for redness.  Respiratory:  Positive for cough. Negative for  chest tightness, shortness of breath and wheezing.   Cardiovascular:  Negative for chest pain and palpitations.  Gastrointestinal:  Negative for abdominal pain, constipation, diarrhea, nausea and vomiting.  Genitourinary:  Negative for dysuria and frequency.  Musculoskeletal:  Negative for arthralgias, back pain, joint swelling and neck pain.  Skin:  Negative for rash.  Neurological: Negative.  Negative for tremors and numbness.  Hematological:  Negative for adenopathy. Does not bruise/bleed easily.  Psychiatric/Behavioral:  Positive for sleep disturbance. Negative for behavioral problems (Depression), self-injury and suicidal ideas. The patient is nervous/anxious.     Vital Signs: BP 132/78   Pulse 95   Temp (!) 97.3 F (36.3 C)   Resp 16   Ht 5' 3 (1.6 m)   Wt 130 lb 3.2 oz (59.1 kg)   SpO2 98%   BMI 23.06 kg/m    Physical Exam Vitals reviewed.  Constitutional:      General: She is not in acute distress.    Appearance: Normal appearance. She is normal weight. She is not ill-appearing.  HENT:     Head: Normocephalic and atraumatic.  Eyes:     Pupils: Pupils are equal, round, and reactive to light.  Cardiovascular:     Rate and Rhythm: Normal rate and regular rhythm.  Pulmonary:     Effort: Pulmonary effort is normal. No respiratory distress.  Neurological:     Mental Status: She is alert and oriented to person, place, and time.  Psychiatric:        Mood and Affect: Mood normal.        Behavior: Behavior normal.        Assessment/Plan: 1. NAFLD (nonalcoholic fatty liver disease) (Primary) Noted, periodically checking LFTs and liver ultrasound as needed. Denies any alcohol use.   2. Gastroesophageal reflux disease without esophagitis Continue omeprazole  as prescribed.   3. Chronic midline low back pain with bilateral sciatica Sees pain management specialist for chronic pain and they prescribed percocet to her.   4. Chronic cough Continue using OTC mucinex  DM  and prescription benzonatate  as needed   5. B12 deficiency B12 injection administered in office today  - cyanocobalamin  (VITAMIN B12) injection 1,000 mcg  6. Vitamin D  deficiency Continue vitamin D  supplement as prescribed   7. 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 Mild episode of recurrent major depressive disorder (HCC) Not currently on an antidepressant at this time. Symptoms are manageable at this time.   9. Generalized anxiety disorder Continue diazepam  as prescribed. Follow up in 3 months for additional refills.  - diazepam  (VALIUM ) 5 MG tablet; Take 1 tablet (5 mg total) by mouth every 12 (twelve) hours as needed for anxiety. Or sleep  Dispense:  60 tablet; Refill: 2   General Counseling: Sandhya verbalizes understanding of the findings of todays visit and agrees with plan of treatment. I have discussed any further diagnostic evaluation that may be needed or ordered today. We also reviewed her medications today. she has been encouraged to call the office with any questions or concerns that should arise related to todays visit.    Orders Placed This Encounter  Procedures   DG Bone Density    Meds ordered this encounter  Medications   diazepam  (VALIUM ) 5 MG tablet    Sig: Take 1 tablet (5 mg total) by mouth every 12 (twelve) hours as needed for anxiety. Or sleep    Dispense:  60 tablet    Refill:  2    For future refills   cyanocobalamin  (VITAMIN B12) injection 1,000 mcg    Return in about 3 months (around 09/13/2024) for AWV, Vivek Grealish PCP and valium  refills .   Total time spent:30 Minutes Time spent includes review of chart, medications, test results, and follow up plan with the patient.   [place] Controlled Substance Database was reviewed by me.  This patient was seen by Mardy Maxin, FNP-C in collaboration with Dr. Sigrid Bathe as a part of collaborative care agreement.   Biannca Scantlin R. Maxin, MSN, FNP-C Internal  medicine

## 2024-07-17 ENCOUNTER — Encounter: Payer: Self-pay | Admitting: Nurse Practitioner

## 2024-07-17 DIAGNOSIS — E2839 Other primary ovarian failure: Secondary | ICD-10-CM | POA: Insufficient documentation

## 2024-07-17 DIAGNOSIS — M069 Rheumatoid arthritis, unspecified: Secondary | ICD-10-CM | POA: Insufficient documentation

## 2024-07-27 ENCOUNTER — Other Ambulatory Visit: Payer: Self-pay

## 2024-07-27 ENCOUNTER — Telehealth: Payer: Self-pay

## 2024-07-27 MED ORDER — PREDNISONE 10 MG PO TABS: ORAL_TABLET | ORAL | 0 refills | Status: DC

## 2024-07-27 NOTE — Telephone Encounter (Signed)
 Patient notified

## 2024-09-13 ENCOUNTER — Ambulatory Visit: Admitting: Nurse Practitioner

## 2024-09-14 ENCOUNTER — Ambulatory Visit: Admitting: Nurse Practitioner

## 2024-09-15 ENCOUNTER — Telehealth: Payer: Self-pay

## 2024-09-15 DIAGNOSIS — K219 Gastro-esophageal reflux disease without esophagitis: Secondary | ICD-10-CM

## 2024-09-15 MED ORDER — OMEPRAZOLE 40 MG PO CPDR: 40.0000 mg | DELAYED_RELEASE_CAPSULE | Freq: Every day | ORAL | 6 refills | Status: AC

## 2024-09-15 NOTE — Telephone Encounter (Signed)
 Lmom that she is not due for med

## 2024-09-20 ENCOUNTER — Ambulatory Visit: Admitting: Nurse Practitioner

## 2024-09-20 ENCOUNTER — Encounter: Payer: Self-pay | Admitting: Nurse Practitioner

## 2024-09-20 VITALS — BP 128/86 | HR 91 | Temp 97.7°F | Resp 16 | Ht 63.0 in | Wt 130.0 lb

## 2024-09-20 DIAGNOSIS — F411 Generalized anxiety disorder: Secondary | ICD-10-CM | POA: Diagnosis not present

## 2024-09-20 DIAGNOSIS — Z0001 Encounter for general adult medical examination with abnormal findings: Secondary | ICD-10-CM

## 2024-09-20 DIAGNOSIS — K219 Gastro-esophageal reflux disease without esophagitis: Secondary | ICD-10-CM | POA: Diagnosis not present

## 2024-09-20 DIAGNOSIS — M5442 Lumbago with sciatica, left side: Secondary | ICD-10-CM | POA: Diagnosis not present

## 2024-09-20 DIAGNOSIS — J069 Acute upper respiratory infection, unspecified: Secondary | ICD-10-CM

## 2024-09-20 DIAGNOSIS — K760 Fatty (change of) liver, not elsewhere classified: Secondary | ICD-10-CM

## 2024-09-20 DIAGNOSIS — G8929 Other chronic pain: Secondary | ICD-10-CM | POA: Diagnosis not present

## 2024-09-20 DIAGNOSIS — M5441 Lumbago with sciatica, right side: Secondary | ICD-10-CM

## 2024-09-20 MED ORDER — DIAZEPAM 5 MG PO TABS: 5.0000 mg | ORAL_TABLET | Freq: Two times a day (BID) | ORAL | 2 refills | Status: AC | PRN

## 2024-09-20 MED ORDER — AZITHROMYCIN 250 MG PO TABS: ORAL_TABLET | ORAL | 0 refills | Status: AC

## 2024-09-20 NOTE — Progress Notes (Signed)
 [facility] [address] [place], [place] [address]  Internal MEDICINE  Office Visit Note  Patient Name: Morgan Abbott  [id number]  [id number]  Date of Service: 09/20/2024  Chief Complaint  Patient presents with   Hyperlipidemia   Medicare Wellness    HPI Aminata presents for a medicare annual wellness visit.  Well-appearing 79 y.o. female with asthma, GERD, low B12, low vitamin D , GAD, chronic pain, and depression, NAFLD, elevated hemidiaphragm, chronic cough.  Routine CRC screening: discontinued, aged out  Routine mammogram: discontinued, aged out  DEXA scan: reminded patient to reschedule this Labs: labs were done in August. Mostly normal except for slightly low vitamin D  level.  New or worsening pain: chronic pain, goes to pain management clinic  Other concerns: Reports runny nose, cough, sinus drainage for the past couple of days.      09/20/2024    1:24 PM 09/11/2023    9:08 AM 09/04/2022    9:12 AM  MMSE - Mini Mental State Exam  Orientation to time 5 5 5   Orientation to Place 5 5 5   Registration 3 3 3   Attention/ Calculation 5 5 5   Recall 3 3 3   Language- name 2 objects 2 2 2   Language- repeat 1 1 1   Language- follow 3 step command 3 3 3   Language- read & follow direction 1 1 1   Write a sentence 1 1 1   Copy design 1 1 1   Total score 30 30 30     Functional Status Survey: Is the patient deaf or have difficulty hearing?: No Does the patient have difficulty seeing, even when wearing glasses/contacts?: No Does the patient have difficulty concentrating, remembering, or making decisions?: No Does the patient have difficulty walking or climbing stairs?: Yes Does the patient have difficulty dressing or bathing?: No Does the patient have difficulty doing errands alone such as visiting a doctor's office or shopping?: No     11/29/2021    3:48 PM 09/04/2022    9:10 AM 01/07/2023    8:28 AM 09/11/2023    9:07 AM 09/20/2024    1:23 PM  Fall Risk  Falls in  the past year? 0 0 0 0 0  Was there an injury with Fall?  0  0   0  Fall Risk Category Calculator  0 0  0  Fall Risk Category (Retired)  Low      (RETIRED) Patient Fall Risk Level Low fall risk  Low fall risk      Patient at Risk for Falls Due to No Fall Risks No Fall Risks No Fall Risks    Fall risk Follow up Falls evaluation completed  Falls evaluation completed  Falls evaluation completed  Falls evaluation completed     Data saved with a previous flowsheet row definition       09/20/2024    1:24 PM  Depression screen PHQ 2/9  Decreased Interest 0  Down, Depressed, Hopeless 0  PHQ - 2 Score 0       Current Medication: Outpatient Encounter Medications as of 09/20/2024  Medication Sig   azithromycin  (ZITHROMAX ) 250 MG tablet Take 2 tablets on day 1, then 1 tablet daily on days 2 through 5   dextromethorphan-guaiFENesin  (MUCINEX  DM) 30-600 MG 12hr tablet Take 1 tablet by mouth 2 (two) times daily.   diazepam  (VALIUM ) 5 MG tablet Take 1 tablet (5 mg total) by mouth every 12 (twelve) hours as needed for anxiety. Or sleep   omeprazole  (PRILOSEC) 40 MG  capsule Take 1 capsule (40 mg total) by mouth daily.   oxyCODONE -acetaminophen  (PERCOCET) 10-325 MG tablet Take by mouth. 1 TAB BY MOUTH EVERY 12 HRS X 10 DAYS, 1 TAB EVERY 8 HRS X 10 DAYS THEN 1 TAB EVERY 6 HRS X 10 DAYS   [DISCONTINUED] albuterol  (VENTOLIN  HFA) 108 (90 Base) MCG/ACT inhaler Inhale 2 puffs into the lungs every 6 (six) hours as needed for up to 7 days for wheezing or shortness of breath.   [DISCONTINUED] benzonatate  (TESSALON ) 200 MG capsule Take 1 capsule (200 mg total) by mouth 2 (two) times daily as needed for cough.   [DISCONTINUED] betamethasone  dipropionate 0.05 % cream Apply topically 2 (two) times daily.   [DISCONTINUED] clobetasol  ointment (TEMOVATE ) 0.05 % Apply 1 Application topically 2 (two) times daily. To rash on legs until resolved.   [DISCONTINUED] diazepam  (VALIUM ) 5 MG tablet Take 1 tablet (5 mg total) by  mouth every 12 (twelve) hours as needed for anxiety. Or sleep   [DISCONTINUED] ergocalciferol  (VITAMIN D2) 1.25 MG (50000 UT) capsule Take 1 capsule (50,000 Units total) by mouth every 14 (fourteen) days.   [DISCONTINUED] predniSONE  (DELTASONE ) 10 MG tablet Use a directed for 6 day taper.   No facility-administered encounter medications on file as of 09/20/2024.    Surgical History: Past Surgical History:  Procedure Laterality Date   CHOLECYSTECTOMY  1996   COLON RESECTION     dilatation and curettage     TONSILLECTOMY Bilateral     Medical History: Past Medical History:  Diagnosis Date   Allergic rhinitis    Arthritis    Bronchitis    Hyperlipidemia    Migraines    RSV (acute bronchiolitis due to respiratory syncytial virus)     Family History: Family History  Problem Relation Age of Onset   Hypertension Mother    Bladder Cancer Father        42 yrs old when passed away    Social History   Socioeconomic History   Marital status: Married    Spouse name: Not on file   Number of children: Not on file   Years of education: Not on file   Highest education level: Not on file  Occupational History   Not on file  Tobacco Use   Smoking status: Never   Smokeless tobacco: Never  Substance and Sexual Activity   Alcohol use: No   Drug use: No   Sexual activity: Not on file  Other Topics Concern   Not on file  Social History Narrative   Not on file   Social Drivers of Health   Tobacco Use: Low Risk (09/20/2024)   Patient History    Smoking Tobacco Use: Never    Smokeless Tobacco Use: Never    Passive Exposure: Not on file  Financial Resource Strain: Not on file  Food Insecurity: Not on file  Transportation Needs: Not on file  Physical Activity: Not on file  Stress: Not on file  Social Connections: Not on file  Intimate Partner Violence: Not on file  Depression (PHQ2-9): Low Risk (09/20/2024)   Depression (PHQ2-9)    PHQ-2 Score: 0  Alcohol Screen: Low Risk  (11/29/2021)   Alcohol Screen    Last Alcohol Screening Score (AUDIT): 0  Housing: Not on file  Utilities: Not on file  Health Literacy: Not on file      Review of Systems  Constitutional:  Positive for fatigue. Negative for activity change, appetite change, chills, fever and unexpected weight change.  HENT:  Positive for congestion, postnasal drip, rhinorrhea, sinus pressure, sinus pain and sore throat. Negative for ear pain and trouble swallowing.   Eyes: Negative.   Respiratory:  Positive for cough. Negative for chest tightness, shortness of breath and wheezing.   Cardiovascular: Negative.  Negative for chest pain and palpitations.  Gastrointestinal: Negative.  Negative for abdominal pain, blood in stool, constipation, diarrhea, nausea and vomiting.  Endocrine: Negative.   Genitourinary: Negative.  Negative for difficulty urinating, dysuria, frequency, hematuria and urgency.  Musculoskeletal: Negative.  Negative for arthralgias, back pain, joint swelling, myalgias and neck pain.  Skin: Negative.  Negative for rash and wound.  Allergic/Immunologic: Negative.  Negative for immunocompromised state.  Neurological:  Positive for headaches. Negative for dizziness, seizures and numbness.  Hematological: Negative.   Psychiatric/Behavioral: Negative.  Negative for behavioral problems, self-injury and suicidal ideas. The patient is not nervous/anxious.     Vital Signs: BP 128/86   Pulse 91   Temp 97.7 F (36.5 C)   Resp 16   Ht 5' 3 (1.6 m)   Wt 130 lb (59 kg)   SpO2 98%   BMI 23.03 kg/m    Physical Exam Vitals reviewed.  Constitutional:      General: She is not in acute distress.    Appearance: Normal appearance. She is normal weight. She is not ill-appearing.  HENT:     Head: Normocephalic and atraumatic.     Right Ear: Tympanic membrane, ear canal and external ear normal.     Left Ear: Tympanic membrane, ear canal and external ear normal.     Nose: Congestion and  rhinorrhea present.     Mouth/Throat:     Mouth: Mucous membranes are moist.     Pharynx: Posterior oropharyngeal erythema present.  Eyes:     Extraocular Movements: Extraocular movements intact.     Conjunctiva/sclera: Conjunctivae normal.     Pupils: Pupils are equal, round, and reactive to light.  Cardiovascular:     Rate and Rhythm: Normal rate and regular rhythm.     Pulses: Normal pulses.     Heart sounds: Normal heart sounds. No murmur heard. Pulmonary:     Effort: Pulmonary effort is normal. No respiratory distress.     Breath sounds: Normal breath sounds. No wheezing.  Abdominal:     General: Bowel sounds are normal.     Palpations: Abdomen is soft.  Musculoskeletal:     Cervical back: Normal range of motion and neck supple.  Skin:    General: Skin is warm and dry.     Capillary Refill: Capillary refill takes less than 2 seconds.  Neurological:     Mental Status: She is alert and oriented to person, place, and time.     Cranial Nerves: No cranial nerve deficit.     Coordination: Coordination normal.     Gait: Gait normal.  Psychiatric:        Mood and Affect: Mood normal.        Behavior: Behavior normal.        Thought Content: Thought content normal.        Judgment: Judgment normal.        Assessment/Plan: 1. Encounter for Medicare annual examination with abnormal findings (Primary) Age-appropriate preventive screenings and vaccinations discussed. Routine labs for health maintenance are up to date, repeat in August this year. PHM updated.    2. Upper respiratory tract infection, unspecified type Zpak prescribed, take until gone  - azithromycin  (ZITHROMAX ) 250 MG tablet; Take 2 tablets  on day 1, then 1 tablet daily on days 2 through 5  Dispense: 6 tablet; Refill: 0  3. NAFLD (nonalcoholic fatty liver disease) Continue low fat low cholesterol diet and limit hepatotoxic medications.   4. Gastroesophageal reflux disease without esophagitis Continue  omeprazole  as prescribed.   5. Chronic midline low back pain with bilateral sciatica Continue to follow up with pain management specialist and take medication as prescribed by them.   6. Generalized anxiety disorder Continue diazepam  as prescribed. Follow up in 3 months for additional refills. UDS due at next visit.  - diazepam  (VALIUM ) 5 MG tablet; Take 1 tablet (5 mg total) by mouth every 12 (twelve) hours as needed for anxiety. Or sleep  Dispense: 60 tablet; Refill: 2    General Counseling: Anitria verbalizes understanding of the findings of todays visit and agrees with plan of treatment. I have discussed any further diagnostic evaluation that may be needed or ordered today. We also reviewed her medications today. she has been encouraged to call the office with any questions or concerns that should arise related to todays visit.    No orders of the defined types were placed in this encounter.   Meds ordered this encounter  Medications   azithromycin  (ZITHROMAX ) 250 MG tablet    Sig: Take 2 tablets on day 1, then 1 tablet daily on days 2 through 5    Dispense:  6 tablet    Refill:  0   diazepam  (VALIUM ) 5 MG tablet    Sig: Take 1 tablet (5 mg total) by mouth every 12 (twelve) hours as needed for anxiety. Or sleep    Dispense:  60 tablet    Refill:  2    Refill due today, fill now. And for future refills.    Return in about 2 months (around 12/01/2024) for F/U, anxiety med refill, Trevonte Ashkar PCP.   Total time spent:30 Minutes Time spent includes review of chart, medications, test results, and follow up plan with the patient.   [place] Controlled Substance Database was reviewed by me.  This patient was seen by Mardy Maxin, FNP-C in collaboration with Dr. Sigrid Bathe as a part of collaborative care agreement.  Christoffer Currier R. Maxin, MSN, FNP-C Internal medicine

## 2024-09-20 NOTE — Telephone Encounter (Signed)
 Pt seen in office

## 2024-12-01 ENCOUNTER — Ambulatory Visit: Admitting: Nurse Practitioner

## 2025-09-21 ENCOUNTER — Ambulatory Visit: Admitting: Nurse Practitioner
